# Patient Record
Sex: Female | Born: 1955 | Race: White | Hispanic: No | Smoking: Current some day smoker
Health system: Southern US, Community
[De-identification: ages and names within clinical notes are randomized; demographics above are authoritative.]

## PROBLEM LIST (undated history)

## (undated) DIAGNOSIS — M1990 Unspecified osteoarthritis, unspecified site: Secondary | ICD-10-CM

## (undated) DIAGNOSIS — G4730 Sleep apnea, unspecified: Secondary | ICD-10-CM

## (undated) DIAGNOSIS — Z8719 Personal history of other diseases of the digestive system: Secondary | ICD-10-CM

## (undated) DIAGNOSIS — R011 Cardiac murmur, unspecified: Secondary | ICD-10-CM

## (undated) DIAGNOSIS — R51 Headache: Secondary | ICD-10-CM

## (undated) DIAGNOSIS — R32 Unspecified urinary incontinence: Secondary | ICD-10-CM

## (undated) DIAGNOSIS — Z8489 Family history of other specified conditions: Secondary | ICD-10-CM

## (undated) DIAGNOSIS — Z806 Family history of leukemia: Secondary | ICD-10-CM

## (undated) DIAGNOSIS — Z9109 Other allergy status, other than to drugs and biological substances: Secondary | ICD-10-CM

## (undated) DIAGNOSIS — I00 Rheumatic fever without heart involvement: Secondary | ICD-10-CM

## (undated) DIAGNOSIS — F329 Major depressive disorder, single episode, unspecified: Secondary | ICD-10-CM

## (undated) DIAGNOSIS — D3500 Benign neoplasm of unspecified adrenal gland: Secondary | ICD-10-CM

## (undated) DIAGNOSIS — M797 Fibromyalgia: Secondary | ICD-10-CM

## (undated) DIAGNOSIS — Z800 Family history of malignant neoplasm of digestive organs: Secondary | ICD-10-CM

## (undated) DIAGNOSIS — E785 Hyperlipidemia, unspecified: Secondary | ICD-10-CM

## (undated) DIAGNOSIS — M8580 Other specified disorders of bone density and structure, unspecified site: Secondary | ICD-10-CM

## (undated) DIAGNOSIS — M75100 Unspecified rotator cuff tear or rupture of unspecified shoulder, not specified as traumatic: Secondary | ICD-10-CM

## (undated) DIAGNOSIS — G8929 Other chronic pain: Secondary | ICD-10-CM

## (undated) DIAGNOSIS — K219 Gastro-esophageal reflux disease without esophagitis: Secondary | ICD-10-CM

## (undated) DIAGNOSIS — I10 Essential (primary) hypertension: Secondary | ICD-10-CM

## (undated) DIAGNOSIS — Z8711 Personal history of peptic ulcer disease: Secondary | ICD-10-CM

## (undated) DIAGNOSIS — M810 Age-related osteoporosis without current pathological fracture: Secondary | ICD-10-CM

## (undated) DIAGNOSIS — I219 Acute myocardial infarction, unspecified: Secondary | ICD-10-CM

## (undated) DIAGNOSIS — Z87442 Personal history of urinary calculi: Secondary | ICD-10-CM

## (undated) DIAGNOSIS — Z803 Family history of malignant neoplasm of breast: Secondary | ICD-10-CM

## (undated) DIAGNOSIS — I2510 Atherosclerotic heart disease of native coronary artery without angina pectoris: Secondary | ICD-10-CM

## (undated) DIAGNOSIS — F32A Depression, unspecified: Secondary | ICD-10-CM

## (undated) DIAGNOSIS — H3530 Unspecified macular degeneration: Secondary | ICD-10-CM

## (undated) HISTORY — DX: Unspecified macular degeneration: H35.30

## (undated) HISTORY — PX: BACK SURGERY: SHX140

## (undated) HISTORY — DX: Essential (primary) hypertension: I10

## (undated) HISTORY — PX: ABDOMINAL HYSTERECTOMY: SHX81

## (undated) HISTORY — DX: Family history of leukemia: Z80.6

## (undated) HISTORY — DX: Benign neoplasm of unspecified adrenal gland: D35.00

## (undated) HISTORY — DX: Family history of malignant neoplasm of breast: Z80.3

## (undated) HISTORY — DX: Age-related osteoporosis without current pathological fracture: M81.0

## (undated) HISTORY — DX: Hyperlipidemia, unspecified: E78.5

## (undated) HISTORY — PX: LAPAROSCOPIC LYSIS OF ADHESIONS: SHX5905

## (undated) HISTORY — PX: PELVIC ABCESS DRAINAGE: SHX2189

## (undated) HISTORY — DX: Family history of malignant neoplasm of digestive organs: Z80.0

## (undated) HISTORY — DX: Rheumatic fever without heart involvement: I00

## (undated) HISTORY — DX: Cardiac murmur, unspecified: R01.1

## (undated) HISTORY — DX: Atherosclerotic heart disease of native coronary artery without angina pectoris: I25.10

## (undated) HISTORY — DX: Acute myocardial infarction, unspecified: I21.9

## (undated) HISTORY — PX: MYOMECTOMY: SHX85

## (undated) HISTORY — DX: Unspecified urinary incontinence: R32

## (undated) HISTORY — DX: Other chronic pain: G89.29

## (undated) HISTORY — DX: Other specified disorders of bone density and structure, unspecified site: M85.80

---

## 1997-07-07 ENCOUNTER — Other Ambulatory Visit: Admission: RE | Admit: 1997-07-07 | Discharge: 1997-07-07 | Payer: Self-pay | Admitting: Cardiology

## 1997-07-21 ENCOUNTER — Other Ambulatory Visit: Admission: RE | Admit: 1997-07-21 | Discharge: 1997-07-21 | Payer: Self-pay | Admitting: Cardiology

## 1998-02-27 ENCOUNTER — Other Ambulatory Visit: Admission: RE | Admit: 1998-02-27 | Discharge: 1998-02-27 | Payer: Self-pay | Admitting: Obstetrics and Gynecology

## 1999-10-09 ENCOUNTER — Encounter: Payer: Self-pay | Admitting: Obstetrics and Gynecology

## 1999-10-09 ENCOUNTER — Encounter: Admission: RE | Admit: 1999-10-09 | Discharge: 1999-10-09 | Payer: Self-pay | Admitting: Obstetrics and Gynecology

## 2000-10-14 ENCOUNTER — Encounter: Payer: Self-pay | Admitting: Obstetrics and Gynecology

## 2000-10-14 ENCOUNTER — Encounter: Admission: RE | Admit: 2000-10-14 | Discharge: 2000-10-14 | Payer: Self-pay | Admitting: Obstetrics and Gynecology

## 2001-01-25 ENCOUNTER — Inpatient Hospital Stay (HOSPITAL_COMMUNITY): Admission: EM | Admit: 2001-01-25 | Discharge: 2001-01-29 | Payer: Self-pay | Admitting: *Deleted

## 2001-02-19 ENCOUNTER — Encounter (HOSPITAL_COMMUNITY): Admission: RE | Admit: 2001-02-19 | Discharge: 2001-03-29 | Payer: Self-pay | Admitting: Cardiology

## 2001-11-19 ENCOUNTER — Inpatient Hospital Stay (HOSPITAL_COMMUNITY): Admission: EM | Admit: 2001-11-19 | Discharge: 2001-11-30 | Payer: Self-pay | Admitting: Emergency Medicine

## 2001-12-20 ENCOUNTER — Encounter (HOSPITAL_COMMUNITY): Admission: RE | Admit: 2001-12-20 | Discharge: 2002-03-20 | Payer: Self-pay | Admitting: Cardiology

## 2001-12-23 ENCOUNTER — Encounter: Admission: RE | Admit: 2001-12-23 | Discharge: 2001-12-23 | Payer: Self-pay | Admitting: Obstetrics and Gynecology

## 2001-12-23 ENCOUNTER — Encounter: Payer: Self-pay | Admitting: Obstetrics and Gynecology

## 2003-02-24 ENCOUNTER — Encounter: Admission: RE | Admit: 2003-02-24 | Discharge: 2003-02-24 | Payer: Self-pay | Admitting: Emergency Medicine

## 2003-12-20 ENCOUNTER — Encounter (INDEPENDENT_AMBULATORY_CARE_PROVIDER_SITE_OTHER): Payer: Self-pay | Admitting: Specialist

## 2003-12-20 ENCOUNTER — Ambulatory Visit (HOSPITAL_COMMUNITY): Admission: RE | Admit: 2003-12-20 | Discharge: 2003-12-20 | Payer: Self-pay | Admitting: *Deleted

## 2004-07-26 ENCOUNTER — Encounter: Admission: RE | Admit: 2004-07-26 | Discharge: 2004-07-26 | Payer: Self-pay | Admitting: Obstetrics and Gynecology

## 2005-02-05 ENCOUNTER — Emergency Department (HOSPITAL_COMMUNITY): Admission: EM | Admit: 2005-02-05 | Discharge: 2005-02-05 | Payer: Self-pay | Admitting: Emergency Medicine

## 2005-05-12 ENCOUNTER — Encounter: Admission: RE | Admit: 2005-05-12 | Discharge: 2005-05-12 | Payer: Self-pay | Admitting: Emergency Medicine

## 2005-09-11 ENCOUNTER — Encounter: Admission: RE | Admit: 2005-09-11 | Discharge: 2005-09-11 | Payer: Self-pay | Admitting: Emergency Medicine

## 2005-12-26 ENCOUNTER — Encounter: Admission: RE | Admit: 2005-12-26 | Discharge: 2005-12-26 | Payer: Self-pay | Admitting: Emergency Medicine

## 2006-01-16 ENCOUNTER — Inpatient Hospital Stay (HOSPITAL_BASED_OUTPATIENT_CLINIC_OR_DEPARTMENT_OTHER): Admission: RE | Admit: 2006-01-16 | Discharge: 2006-01-16 | Payer: Self-pay | Admitting: Cardiology

## 2006-09-22 ENCOUNTER — Encounter: Admission: RE | Admit: 2006-09-22 | Discharge: 2006-09-22 | Payer: Self-pay | Admitting: Emergency Medicine

## 2007-05-18 ENCOUNTER — Encounter: Admission: RE | Admit: 2007-05-18 | Discharge: 2007-05-18 | Payer: Self-pay | Admitting: Neurosurgery

## 2007-06-11 ENCOUNTER — Encounter: Admission: RE | Admit: 2007-06-11 | Discharge: 2007-06-11 | Payer: Self-pay | Admitting: Cardiology

## 2007-09-23 ENCOUNTER — Other Ambulatory Visit: Admission: RE | Admit: 2007-09-23 | Discharge: 2007-09-23 | Payer: Self-pay | Admitting: Obstetrics and Gynecology

## 2007-11-30 ENCOUNTER — Encounter: Admission: RE | Admit: 2007-11-30 | Discharge: 2007-11-30 | Payer: Self-pay | Admitting: Obstetrics and Gynecology

## 2009-01-29 ENCOUNTER — Encounter: Admission: RE | Admit: 2009-01-29 | Discharge: 2009-01-29 | Payer: Self-pay | Admitting: Gastroenterology

## 2009-04-06 ENCOUNTER — Encounter: Admission: RE | Admit: 2009-04-06 | Discharge: 2009-04-06 | Payer: Self-pay | Admitting: Obstetrics and Gynecology

## 2009-10-15 ENCOUNTER — Ambulatory Visit: Payer: Self-pay | Admitting: Cardiology

## 2010-01-30 ENCOUNTER — Ambulatory Visit: Payer: Self-pay | Admitting: Cardiology

## 2010-04-01 ENCOUNTER — Other Ambulatory Visit: Payer: Self-pay | Admitting: Obstetrics and Gynecology

## 2010-04-01 DIAGNOSIS — Z1231 Encounter for screening mammogram for malignant neoplasm of breast: Secondary | ICD-10-CM

## 2010-04-30 ENCOUNTER — Ambulatory Visit
Admission: RE | Admit: 2010-04-30 | Discharge: 2010-04-30 | Disposition: A | Discharge status: Home / Self Care | Payer: BC Managed Care – PPO | Source: Ambulatory Visit | Attending: Obstetrics and Gynecology | Admitting: Obstetrics and Gynecology

## 2010-04-30 DIAGNOSIS — Z1231 Encounter for screening mammogram for malignant neoplasm of breast: Secondary | ICD-10-CM

## 2010-06-03 ENCOUNTER — Other Ambulatory Visit: Payer: Self-pay | Admitting: Cardiology

## 2010-06-03 DIAGNOSIS — I10 Essential (primary) hypertension: Secondary | ICD-10-CM

## 2010-06-03 MED ORDER — DILTIAZEM HCL ER COATED BEADS 120 MG PO CP24: 120.0000 mg | ORAL_CAPSULE | Freq: Every day | ORAL | Status: DC

## 2010-06-03 NOTE — Telephone Encounter (Signed)
Patient's Diltiazem HCL CD 120 mg is out.  She wants this refilled through [facility]--90 day supply.

## 2010-06-17 ENCOUNTER — Other Ambulatory Visit: Payer: Self-pay | Admitting: *Deleted

## 2010-06-17 DIAGNOSIS — E7800 Pure hypercholesterolemia, unspecified: Secondary | ICD-10-CM

## 2010-06-27 ENCOUNTER — Telehealth: Payer: Self-pay | Admitting: Cardiology

## 2010-06-27 NOTE — Telephone Encounter (Signed)
Fax: [phone] Post Op of her stints in 12/31/94

## 2010-06-28 ENCOUNTER — Encounter: Payer: Self-pay | Admitting: Cardiology

## 2010-07-03 ENCOUNTER — Encounter: Payer: Self-pay | Admitting: Cardiology

## 2010-07-03 DIAGNOSIS — R233 Spontaneous ecchymoses: Secondary | ICD-10-CM | POA: Insufficient documentation

## 2010-07-03 DIAGNOSIS — M2550 Pain in unspecified joint: Secondary | ICD-10-CM | POA: Insufficient documentation

## 2010-07-03 DIAGNOSIS — I259 Chronic ischemic heart disease, unspecified: Secondary | ICD-10-CM | POA: Insufficient documentation

## 2010-07-03 DIAGNOSIS — M25519 Pain in unspecified shoulder: Secondary | ICD-10-CM | POA: Insufficient documentation

## 2010-07-03 DIAGNOSIS — R238 Other skin changes: Secondary | ICD-10-CM | POA: Insufficient documentation

## 2010-07-03 DIAGNOSIS — E785 Hyperlipidemia, unspecified: Secondary | ICD-10-CM | POA: Insufficient documentation

## 2010-07-04 ENCOUNTER — Other Ambulatory Visit (INDEPENDENT_AMBULATORY_CARE_PROVIDER_SITE_OTHER): Payer: BC Managed Care – PPO | Admitting: *Deleted

## 2010-07-04 ENCOUNTER — Encounter: Payer: Self-pay | Admitting: Cardiology

## 2010-07-04 ENCOUNTER — Ambulatory Visit (INDEPENDENT_AMBULATORY_CARE_PROVIDER_SITE_OTHER): Payer: BC Managed Care – PPO | Admitting: Cardiology

## 2010-07-04 DIAGNOSIS — E7800 Pure hypercholesterolemia, unspecified: Secondary | ICD-10-CM

## 2010-07-04 DIAGNOSIS — M25519 Pain in unspecified shoulder: Secondary | ICD-10-CM

## 2010-07-04 DIAGNOSIS — I2510 Atherosclerotic heart disease of native coronary artery without angina pectoris: Secondary | ICD-10-CM | POA: Insufficient documentation

## 2010-07-04 LAB — HEPATIC FUNCTION PANEL
Albumin: 3.9 g/dL (ref 3.5–5.2)
Bilirubin, Direct: 0 mg/dL (ref 0.0–0.3)
Total Protein: 6.4 g/dL (ref 6.0–8.3)

## 2010-07-04 LAB — BASIC METABOLIC PANEL
BUN: 12 mg/dL (ref 6–23)
GFR: 79.78 mL/min (ref >60.00)
Sodium: 141 mEq/L (ref 135–145)

## 2010-07-04 LAB — LIPID PANEL: HDL: 54.6 mg/dL (ref >39.00)

## 2010-07-04 NOTE — Progress Notes (Signed)
Subjective:   Donna Bullock is seen today for followup visit. 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 She has an MRI pending. She is in pain and requires nocturnal narcotics that she doesn't take pain medications during the day so she can continue to work. She has not had any anginal pain. Unfortunately, she continues to smoke a half-pack of cigarettes per day.  She had depression with Chantix. Her first stent in her right coronary artery was in 1996. 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 Her last catheterization was in November of 2007. At that time, she had persistent patency of the stents in the right coronary artery and no other significant obstructive disease. Her last stress was in November of 2009. Her EF at that time was 57% with no evidence of ischemia. She has a history of hyperlipemia that is treated. Unfortunately, she has ongoing tobacco abuse. 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 She has a history of hypertension. 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 This was discovered on CT scan in December of 2010.  Current Outpatient Prescriptions  Medication Sig Dispense Refill  . aspirin 81 MG tablet Take 81 mg by mouth daily.        Marland Kitchen atenolol (TENORMIN) 25 MG tablet Take 25 mg by mouth daily.        . carisoprodol (SOMA) 350 MG tablet Take 1 tablet by mouth Twice daily.      . clopidogrel (PLAVIX) 75 MG tablet Take 75 mg by mouth daily.        Marland Kitchen diltiazem (CARDIZEM CD) 120 MG 24 hr capsule Take 1 capsule (120 mg total) by mouth daily.  90 capsule  3  . ergocalciferol (VITAMIN D2) 50000 UNITS capsule Take 50,000 Units by mouth once a week.        . escitalopram (LEXAPRO) 10 MG tablet Take 10 mg by mouth daily.        . fish oil-omega-3 fatty acids 1000 MG capsule Take by mouth daily.        . fluticasone (FLONASE) 50  MCG/ACT nasal spray 2 sprays by Nasal route daily.        Marland Kitchen HYDROmorphone (DILAUDID) 2 MG tablet Take 2 mg by mouth daily.        . isosorbide mononitrate (IMDUR) 30 MG 24 hr tablet Take 15 mg by mouth daily.        Marland Kitchen lisinopril (PRINIVIL,ZESTRIL) 20 MG tablet Take 20 mg by mouth daily.        . methocarbamol (ROBAXIN) 750 MG tablet Take 750 mg by mouth daily. (UNKNOWN)      . Probiotic Product (PROBIOTIC PO) Take by mouth daily.        . simvastatin (ZOCOR) 40 MG tablet Take 40 mg by mouth at bedtime.        . Multiple Vitamins-Minerals (CENTRUM SILVER) tablet Take 1 tablet by mouth daily.        Marland Kitchen tiZANidine (ZANAFLEX) 4 MG tablet Take 4 mg by mouth every 6 (six) hours as needed. (UNKNOWN)        Allergies  Allergen Reactions  . Valium     Patient Active Problem List  Diagnoses  . Shoulder pain  . Bruises easily  . Joint pain  . MI (myocardial infarction)  . Hyperlipidemia  History  Smoking status  . Current Everyday Smoker -- 0.5 packs/day  . Types: Cigarettes  Smokeless tobacco  . Never Used    History  Alcohol Use No    Family History  Problem Relation Age of Onset  . Alcohol abuse Mother   . Alcohol abuse Father     Review of Systems:   The patient denies any heat or cold intolerance.  No weight gain or weight loss.  The patient denies headaches or blurry vision.  There is no cough or sputum production.  The patient denies dizziness.  There is no hematuria or hematochezia.  The patient denies any muscle aches or arthritis.  The patient denies any rash.  The patient denies frequent falling or instability.  There is no history of depression or anxiety.  All other systems were reviewed and are negative.   Physical Exam:   She has weakness with extension of her right arm. Weight is 154. Blood pressure 144 of 94 sitting, heart rate is 56 and regular.The head is normocephalic and atraumatic.  Pupils are equally round and reactive to light.  Sclerae nonicteric.   Conjunctiva is clear.  Oropharynx is unremarkable.  There's adequate oral airway.  Neck is supple there are no masses.  Thyroid is not enlarged.  There is no lymphadenopathy.  Lungs are clear.  Chest is symmetric.  Heart shows a regular rate and rhythm.  S1 and S2 are normal.  There is no murmur click or gallop.  Abdomen is soft normal bowel sounds.  There is no organomegaly.  Genital and rectal deferred.  Extremities are without edema.  Peripheral pulses are adequate.   The patient is not depressed.  Skin is warm and dry.  Assessment / Plan:

## 2010-07-04 NOTE — Assessment & Plan Note (Signed)
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 MRI is planned for a couple days. She should be a candidate for an operation on her neck if needed that it would be reasonable to do a followup adenosine Cardiolite study for preoperative clearance

## 2010-07-04 NOTE — Assessment & Plan Note (Signed)
She is asymptomatic at this point in time we'll continue current medications. Hopefully she'll be able to have her cervical neck problem addressed with her upcoming MRI

## 2010-07-05 ENCOUNTER — Telehealth: Payer: Self-pay | Admitting: *Deleted

## 2010-07-05 NOTE — Cardiovascular Report (Signed)
[place]  Patient:    Donna Bullock, Donna Bullock Visit Number: [id number] MRN: [id number]          Service Type: MED Location: [facility] [id number] Attending Physician:  Eleanora Neighbor Dictated by:   Colleen Can. Deborah Chalk, M.D. Proc. Date: 01/27/01 Admit Date:  01/25/2001                          Cardiac Catheterization  HISTORY:  Ms. Rabon presented with acute inferior myocardial infarction two days ago and had segmental narrowing from occlusion in a distal left anterior descending that was not felt to be amenable to angioplasty.  She was treated with antiplatelet agents and returned for a reevaluation of the distal left anterior descending, as well as the evaluation of left ventricular function. She had had a previous stent also in the right coronary artery and her EKG changes had been inferoapical (probably from an apical infarction), so pictures of the right coronary artery were performed as well.  PROCEDURES PERFORMED:  Left heart catheterization with selective coronary angiography and left ventricular angiography.  TYPE AND SITE OF ENTRY:  Percutaneous, left femoral artery.  CATHETERS:  Six [place] #4 curved Judkins right and left coronary catheters, 6-French pigtail ventriculography catheter.  CONTRAST MATERIAL:  Omnipaque.  MEDICATIONS GIVEN PRIOR TO PROCEDURE:  Valium 10 mg p.o.  MEDICATION GIVEN DURING PROCEDURE:  Versed 6 mg IV.  COMMENTS:  The patient tolerated the procedure well.  ANGIOGRAPHIC DATA: 1. Left main coronary artery:  Normal. 2. Left circumflex:  The left circumflex continues as a moderately large    marginal vessel.  It is normal. 3. Left anterior descending:  The left anterior descending continues over    two-thirds of its course without significant obstruction, then there is    approximately 40 mm of 50% narrowing in a segmental fashion.  This has    improved from two days earlier.  The left anterior descending itself   wraps around the apex. 4. Right coronary artery:  The right coronary artery has a stented segment    proximally.  There is mild narrowing in this portion.  The distal right    coronary artery is normal.  LEFT VENTRICULAR ANGIOGRAPHY:  Left ventricular angiogram was performed in the RAO position.  Overall cardiac size and silhouette are normal.  There is very mild apical hypokinesis with improvement of wall motion in the inferoapical portion of the heart.  It is still mildly hypokinetic, but it would be hard to notice the abnormality if it were not in comparison with the previous known defect.  Global ejection fraction is 60%.  OVERALL IMPRESSION: 1. Mild inferoapical hypokinesia, but improved from two days earlier. 2. Persistent patency of the left anterior descending with a segmental    50% narrowing. 3. 30% narrowing in the proximal right coronary artery at the level of the    previously stented segment. 4. Essentially normal left circumflex.  DISCUSSION:  We will manage Pat medically at this point in time with lipid lowering antiplatelet agents and nitrates. Dictated by:   Colleen Can Deborah Chalk, M.D. Attending Physician:  Eleanora Neighbor DD:  01/27/01 TD:  01/27/01 Job: [id number]

## 2010-07-05 NOTE — Cardiovascular Report (Signed)
NAME:  Donna Bullock, Donna Bullock              ACCOUNT NO.:  [id number]   MEDICAL RECORD NO.:  [id number]          PATIENT TYPE:  OIB   LOCATION:  1962                         FACILITY:  [facility]   PHYSICIAN:  Colleen Can. Deborah Chalk, M.D.DATE OF BIRTH:  Jun 21, 1955   DATE OF PROCEDURE:  01/16/2006  DATE OF DISCHARGE:  01/16/2006                            CARDIAC CATHETERIZATION   HISTORY:  [name] is a 55 year old female with previous known  cardiovascular disease with previous stents to the right coronary artery  as well as previous anterior infarction.  She is referred for  catheterization because of atypical chest pain.   PROCEDURE:  Left heart catheterization with selective coronary  angiography and left ventricular angiography.   TYPE AND SITE OF ENTRY:  Percutaneous right femoral artery catheters, 4-  French 4 curved Judkins right and left coronary catheters, 4-French  pigtail ventriculographic catheter.   CONTRAST MATERIAL:  Omnipaque.   MEDICATIONS GIVEN DURING THE PROCEDURE:  Fentanyl and Versed 2 mg.   COMMENTS:  The patient tolerated the procedure well.   HEMODYNAMIC DATA:  The aortic pressure 126/78, LV was 125/5-11.   ANGIOGRAPHIC DATA:  1. Left main coronary artery was normal.  2. Left anterior descending was a long vessel that crossed the apex.      It was normal.  3. Left circumflex was a moderate size vessel.  It was normal.  4. Right coronary artery was a dominant vessel.  There was a long area      of stent beyond the acute margin extending beyond the crux.  There      was also a stent to the proximal segment of the right coronary      artery.  There were mild to moderate 20-40% narrowings in the      proximal right coronary artery.  The long stented segment in the      distal right coronary artery was quite satisfactory but just beyond      the posterior descending but still within the body of the stent,      there was a 50% somewhat focal narrowing.  The distal vessel  was      satisfactory and still the same luminal dimensions as the narrowing      within the stent.  The residual narrowing within the stent would      appear to be approximately 2 mm in diameter.  5. Left ventricular angiogram was performed in the RAO position.      Overall cardiac size and silhouette are normal.  There was mild      inferior hypokinesia.  The global ejection fraction was estimated      to be 55%.   OVERALL IMPRESSION:  1. Well preserved global left ventricular function with mild inferior      hypokinesia.  2. Persistent patency of the stents within the right coronary artery      with mild proximal narrowing and mild to      moderate distal narrowing but no significant obstructive disease.  3. Essentially normal left coronary artery.   DISCUSSION:  These findings would suggest that we  could have a good  outcome with risk factor modification.      Colleen Can. Deborah Chalk, M.D.  Electronically Signed     SNT/MEDQ  D:  01/16/2006  T:  01/16/2006  Job:  [id number]   cc:   Colleen Can. Deborah Chalk, M.D.

## 2010-07-05 NOTE — Cardiovascular Report (Signed)
NAME:  Donna Bullock, Donna Bullock NO.:  [id number]   MEDICAL RECORD NO.:  [id number]                   PATIENT TYPE:  INP   LOCATION:  3727                                 FACILITY:  [facility]   PHYSICIAN:  Peter M. Swaziland, M.D.               DATE OF BIRTH:  1955/06/14   DATE OF PROCEDURE:  11/23/2001  DATE OF DISCHARGE:                              CARDIAC CATHETERIZATION   INDICATIONS FOR PROCEDURE:  The patient is a 55 year old white female who  has presented with acute inferior myocardial infarction four days prior.  She underwent stenting of the distal right coronary artery.  She has a  history of coronary spasm.  The morning of procedure, the patient developed  acute severe substernal chest pain associated with marked ST elevation  anterolaterally.  She was hypotensive and was brought back to the cardiac  catheterization lab.   ACCESS:  Via the left femoral artery using standard Seldinger technique.   EQUIPMENT USED:  A #7 French arterial sheath, #6 French 4-cm right and left  Judkins catheters, #6 French pigtail catheter.   CONTRAST:  Omnipaque, 280 cc.   HEMODYNAMIC DATA:  1. Aortic pressure was initially 102/62.  It improved later in the procedure     to 131/74.  2. Left ventricular pressure was 132 with an EDP of 21 mmHg.   MEDICATIONS:  The patient received a total of 1000 mg of intracoronary  Verapamil given in 200 mg boluses.  She was given a total of 1400 mcg of  intracoronary nitroglycerin given in 200 mcg boluses.  She was also given  150 mcg of intracoronary adenosine given in 30-60 mcg boluses.  In addition,  she received IV heparin, 5000 units.  She was given IV Integrilin.  She was  maintained on IV nitroglycerin titrated up to 20 drops/hour.  She was also  given two 10-mg boluses of IV Cardizem followed by Cardizem drip at 10  mg/hour.  In addition, she was given 2 mg of IV Versed, 2 mg of IV morphine.   ANGIOGRAPHIC DATA:  We initially  performed angiogram of the right coronary  artery since this had recently been stented.  The main body of the right  coronary artery was still widely patent including the stented area with no  obstruction or thrombus noted.  There was pruning of the distal right  coronary branches including the posterolateral, posterior descending  branches.  We then performed left coronary angiography.  The left main  coronary artery was without significant disease.  The left anterior  descending artery had diffuse narrowing beginning in the proximal vessel up  to 30%.  Following the first septal perforator, there was a very long  segment of diffuse narrowing of the vessel up to 70-80% followed by subtotal  occlusion of the mid LAD.  There was TIMI grade 0 flow to the distal LAD.  The left circumflex coronary  artery, likewise, demonstrated severe diffuse  narrowing in the vessel up to 90%.  A first marginal branch was occluded.   Left ventricular angiography demonstrated moderate hypokinesia of the distal  anterolateral wall and apex as well as the mid inferior wall.  Left  ventricular systolic function was mildly reduced with ejection fraction  estimated at 45%.   In reviewing her study from four days prior, it was clear that the first  marginal branch was a very large vessel with baseline angiogram  demonstrating that the left circumflex coronary artery was at least a 3-mm  vessel.  The left anterior descending artery appeared to be at least a 3.5-  mm vessel.  Based on these findings, it was felt that her acute coronary  occlusion involving the first marginal branch and the mid LAD were secondary  to severe diffuse coronary spasm.  At this point, we anticoagulated the  patient.  We began giving intracoronary injections of vasodilator through  the left coronary catheter.  Over the following hour-and-a-half, the patient  received a total of vasodilators as listed above.  At this point, her LAD  did  reperfuse.  While she continued to have somewhat diffuse spasm in the  mid vessel, the subtotal stenosis was reduced down to 50-60% and she had  restoration of TIMI-3 flow.  Unfortunately, the left circumflex artery did  not respond as well.  It continued to have diffuse severe spasm of the  entire proximal circumflex with persistent subtotal occlusion of the first  marginal branch.  We did achieve TIMI grade 1 flow down the marginal branch  but there was still severe persistent spasm.  The patient remained  hemodynamically stable throughout the procedure without significant  bradycardia or hypotension.  Her chest pain had improved significantly over  baseline and at the end of the procedure was having minimal chest  discomfort.  Her repeat ECG at this point showed complete resolution of ST  elevation.  She did have persistent ST segment depression anterolaterally.  There did not appear to be any focal coronary lesion that could be  approached by catheter intervention with angioplasty.  The patient was kept  on IV heparin, IV Integrilin, and high-dose IV vasodilators and transferred  to the critical care unit at this point.   FINAL INTERPRETATION:  1. Acute severe three-vessel coronary vasospasm resulting in occlusion in     the mid left anterior descending artery and the first marginal branch of     the left circumflex coronary artery.  Reperfusion obtained with high-dose     intracoronary vasodilator therapy.  2. Mild left ventricular dysfunction.  3. Continued patency of the right coronary artery stent.                                               Peter M. Swaziland, M.D.    PMJ/MEDQ  D:  11/25/2001  T:  11/28/2001  Job:  [id number]   cc:   Colleen Can. Deborah Chalk, M.D.  [address], [address]  [place]  [place] [address]  Fax: [phone]

## 2010-07-05 NOTE — H&P (Signed)
Donna Bullock. Donna Bullock  Patient:    Donna Bullock, Donna Bullock Visit Number: [id number] MRN: [id number]          Service Type: MED Location: [id number] [phone] Attending Physician:  Donna Bullock Dictated by:   Donna Bullock. Donna Bullock, M.D. Admit Date:  01/25/2001                           History and Physical  CHIEF COMPLAINT: Acute inferolateral myocardial infarction.  HISTORY OF PRESENT ILLNESS: [name] was in her usual state of health when she had onset of substernal chest pain for about an hour at work.  She called and was referred to the emergency room, and had ST segment elevation in inferolateral leads.  She was given heparin and IV nitroglycerin and taken immediately to the catheterization laboratory.  In the catheterization laboratory she had sudden onset ventricular fibrillation and had defibrillation externally x 3, and became stable and proceeded on with urgent catheterization.  Her examination while in the emergency room showed an anxious 55 year old female.  HEENT negative.  Heart showed regular rate and rhythm.  Lungs were clear.  Peripheral pulses were satisfactory.  No lower extremity edema.  PAST MEDICAL HISTORY:  1. 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  2. Intermittent anxiety and depression.  3. History of cigarette abuse.  4. History of gastric ulcers in the past.  FAMILY HISTORY: Unremarkable.  Parents were alcoholic.  She is close to a sister, who lives in [place].  SOCIAL HISTORY: She is single.  She is a Production designer, theatre/television/film at [employer].  She has ongoing cigarette abuse.  Has some chronic anxiety but is quite functional. She is quite pleasant and delightful to deal with on a regular basis.  She is under lots of stress and is a perfectionist in her work.  REVIEW OF SYSTEMS: Basically negative.  There have been no real warning symptoms.  She was shoveling snow  yesterday.  PHYSICAL EXAMINATION:  GENERAL: She is a pleasant 55 year old female, appears stated age. HEENT: Negative.  NECK: Supple without bruits.  LUNGS: Clear.  HEART: Regular rate and rhythm.  ABDOMEN: Soft, nontender.  EXTREMITIES: Without edema.  OVERALL ASSESSMENT: Acute inferolateral myocardial infarction.  PLAN: Proceed on directly with emergency catheterization and angioplasty. Dictated by:   Donna Bullock Donna Bullock, M.D. Attending Physician:  Donna Bullock DD:  01/25/01 TD:  01/26/01 Job: [id number]

## 2010-07-05 NOTE — Telephone Encounter (Signed)
Message copied by Barnetta Hammersmith on Fri Jul 05, 2010 10:05 AM ------      Message from: Roger Shelter      Created: Fri Jul 05, 2010  8:57 AM       Ok, recheck in six months.

## 2010-07-05 NOTE — Telephone Encounter (Signed)
Left message on cell with lab results and to have labs in six months.

## 2010-07-05 NOTE — H&P (Signed)
   Donna Bullock, Donna Bullock                          ACCOUNT NO.:  [id number]   MEDICAL RECORD NO.:  [id number]                   PATIENT TYPE:  INP   LOCATION:  4702                                 FACILITY:  [facility]   PHYSICIAN:  Colleen Can. Deborah Chalk, M.D.            DATE OF BIRTH:  02-23-1955   DATE OF ADMISSION:  11/19/2001  DATE OF DISCHARGE:                                HISTORY & PHYSICAL   REASON FOR ADMISSION:  Acute inferior myocardial infarction.   HISTORY:  The patient had an onset of approximately 2 hours of substernal  chest pain.  Initial EKG was unremarkable as she presented to the emergency  room.  However, approximately 9:30 in the morning she developed an acute ST  segment elevation and is admitted directly to the catheterization lab.   She has a history of an acute inferior myocardial infarction in 1996 and an  acute anterior apical and inferolateral myocardial infarction in December of  2002.  She had diffuse spasm appearance in the distal left anterior  descending in December of 2002.  She did have ventricular fibrillation  during that hospital admission.   PAST MEDICAL HISTORY:  Otherwise is remarkable for the dissection of the  right coronary artery on December 31, 1994 treated with a J&J stent.  She  has a history of anxiety, depression.  History of peptic ulcer disease.   FAMILY HISTORY:  Basically unremarkable.  Parents were alcoholics.  She is  close to a sister that lives in [place].   SOCIAL HISTORY:  She is single.  She is Production designer, theatre/television/film at [employer].  She is  under a good bit of stress.  It is a chronic problem.   REVIEW OF SYSTEMS:  Otherwise unremarkable.   PHYSICAL EXAMINATION:  GENERAL:  She is a pleasant 55 year old female,  appears appropriate age.  She is diaphoretic.  VITAL SIGNS:  Her blood pressure was 105/70, heart rate 50, respiratory rate  20 and unlabored.  HEENT:  Negative.  NECK:  Supple without bruits.  LUNGS:  Clear.  HEART:   Regular rate and rhythm.  ABDOMEN:  Soft, nontender.  EXTREMITIES:  Without edema.  NEUROLOGICAL:  Neurologically she is intact.   IMAGING STUDIES:  Compatible with an acute inferior myocardial infarction.    OVERALL IMPRESSION:  Acute inferior myocardial infarction.   PLAN:  Acute catheterization.                                               Colleen Can. Deborah Chalk, M.D.    SNT/MEDQ  D:  11/19/2001  T:  11/22/2001  Job:  [id number]

## 2010-07-05 NOTE — Cardiovascular Report (Signed)
NAMESHILEE, BIGGS NO.:  [id number]   MEDICAL RECORD NO.:  [id number]                   PATIENT TYPE:  INP   LOCATION:  2930                                 FACILITY:  [facility]   PHYSICIAN:  Colleen Can. Deborah Chalk, M.D.            DATE OF BIRTH:  [birth year]   DATE OF PROCEDURE:  11/19/2001  DATE OF DISCHARGE:                              CARDIAC CATHETERIZATION   PROCEDURE:  Acute cardiac catheterization with angioplasty and stents placed  in the right coronary artery in the setting of an acute inferior myocardial  infarction.   INDICATIONS FOR PROCEDURE:  Acute inferior myocardial infarction.   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  She had an acute  myocardial infarction with ventricular fibrillation and a segmental  narrowing in the left anterior descending in 12/02.  She presents with two  hours of chest pain, and then sudden elevation of ST segments while in the  emergency room.   PROCEDURE:  Left heart catheterization with selective coronary angiography,  left ventricular angiography, angioplasty of the posterior descending  coronary, and then subsequently returned with stent placement in the long  segment of the right coronary artery.   TYPE AND SITE OF ENTRY:  Percutaneous right femoral artery.   CATHETERS:  A 6 French 4 curved Judkins right and left coronary catheters, a  6 French pigtail ventricular catheter, a 7 [place] guide catheter, high  torqued floppy guide wire, a 2.5 x 20 Quantum Maverick, subsequently.  A  return after stent placement with a 3.5 x 20 Maverick for post-dilatation  into the junction of the two stents, the proximal stent was a J&J Cypher  stent 3.5 mm in diameter x 33 mm length, with the distal stent being a 3.0 x  28 mm Cypher stent with approximately 5 mm of overlap.   MEDICATIONS GIVEN DURING PROCEDURE:  1.  Heparin.  2. Integrilin.  3. Versed.  4. Zofran.  5. IV nitroglycerin.  6. Intracoronary nitroglycerin.  7. Intracoronary verapamil.  8. At the end of the procedure, Plavix.   COMMENTS:  The patient tolerated the procedure reasonably well, but had a  considerable amount of chest pain through the procedure.   HEMODYNAMIC DATA:  The aortic pressure was 102/80, LV was 135/7-22, there  was no aortic valve gradient noted on pullback.   ANGIOGRAPHIC DATA:  1. The right coronary artery had abrupt narrowing in its mid portion to     approximately 90% narrowing.  Initially, the posterior descending was     occluded.  There was stain of the contrast at the acute margin of the     heart.  There was no flow initially into the posterior descending artery.     Evidence for the Palmaz-Schatz stent could be noted in the proximal right  coronary artery.  2. The left main coronary artery is normal.  3. The left anterior descending is a small vessel that extends around the     apex.  There is no significant obstructive lesions in the right coronary     artery, and this is in star contrast to the residual lesion after her     event of 01/18/01.  The left anterior descending is essentially normal.     There is a small distal diagonal vessel which has some abrupt narrowing     distally.  4. The left circumflex is a reasonably large vessel.  It continues on, and     has somewhat of an abrupt narrowing towards its distal portion, but there     is really no significant focal disease proximally.   LEFT VENTRICULAR ANGIOGRAM:  The left ventricular angiogram is performed in  the RAO position.  Overall cardiac size and silhouette are normal.  There is  mild inferior hypokinesia.  Global ejection fraction of 55%.  There is no  mitral regurgitation noted.   ANGIOPLASTY PROCEDURE:  We used a JR4 guide with side holes and a high  torqued floppy guide wire.  We reviewed the old films, and noted at the  level  there appeared to have staining of the vessel.  There was no branch at  that location, and we were able to surmise that this was a large dissection  in the vessel.  We then returned and were able to pass the guide wire into  what would be the posterior descending vessel.  Using a 2.5 balloon, we were  able to re-establish flow into the posterior descending vessel.  We watched  this for about 15 minutes, and realized that this dissection planed in the  more proximal portion of the right coronary artery would eventually  compromise flow distally.  At that point, we elected to proceed with stent  placement, going back to what would appear to be a more normal segment and  trying to stent the entire length of this dissection.  Initially, we placed  a 3.5 x 32 mm Cypher stent, inflated to 8 atmospheres.  There was  satisfactory flow after the stent placement.  We then returned with a 28 mm  x 3 mm Cypher stent and overlapped the distal end of the larger more  proximal stent.  This was inflated to a maximum of 20 atmospheres.  We then  returned and used a 3.5 x 20 mm balloon to post dilate the junction point of  the stent.  We were able to have satisfactory patency of the posterior  descending coronary, although it remained somewhat small, it did have good  flow.   OVERALL IMPRESSION:  1. An acute dissection and occlusion of branches of the right coronary     artery with an acute inferior myocardial infarction with placement of     approximately 60 mm of Cypher stent in total in the right coronary artery     extending from the acute margin of the heart to beyond the crux with     persistent patency of the posterior descending coronary and distal     branches of the right coronary artery.  2. Well preserved global left ventricular function.  3. Improvement in appearance of the left anterior descending from the acute     event of 12/02.  Colleen Can.  Deborah Chalk, M.D.    SNT/MEDQ  D:  11/19/2001  T:  11/23/2001  Job:  [id number]

## 2010-07-05 NOTE — H&P (Signed)
NAME:  Donna Bullock, Donna Bullock              ACCOUNT NO.:  [id number]   MEDICAL RECORD NO.:  [id number]           PATIENT TYPE:   LOCATION:                                 FACILITY:   PHYSICIAN:  Colleen Can. Deborah Chalk, M.D.    DATE OF BIRTH:   DATE OF ADMISSION:  DATE OF DISCHARGE:                              HISTORY & PHYSICAL   ADMISSION HISTORY AND PHYSICAL   DATE OF ADMISSION:  Planned for Friday, January 16, 2006.   CHIEF COMPLAINT:  Chest pain.   HISTORY OF PRESENT ILLNESS:  Donna Bullock is a 55 year old white female who  has a known history of ischemic heart disease.  She has had previous  inferolateral MI, secondary to dissection of the right coronary  approximately 11 years ago.  She has had recurrent vasospasm with  subsequent inferolateral MI in 2003.  She presented to the office for a  routine evaluation on January 12, 2006 and had complaints of chest  pressure that has been going on with and without exertion.  She feels as  if a hand is pushing down on her.  She has not used nitroglycerin.  The episodes have been occurring more frequently and have been present  probably the last 6-8 months.  She has also had some known cervical disc  disease.  She feels like the symptoms are not related.  She now presents  for elective cardiac catheterization.   PAST MEDICAL HISTORY:  1. 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  She had an acute inferolateral MI      in December of 2002 secondary to vasospasm of the LAD.  At that      time, she had a V-fib arrest and required defibrillation x3.  Her      last catheterization occurred in October of 2003.  She first      underwent cardiac catheterization on October 3 and had stents      placed to the right coronary, again in the setting of acute      inferior myocardial infarction.  She had placement of approximately      60 mm of CYPHER stent at that time.  On November 23, 2001, she was      taken back to the catheterization lab with acute severe 3-vessel      coronary vasospasm that resulted in an occlusion of the mid LAD, as      well as first marginal branch of the left circumflex.  Reperfusion      was obtained with high dose intracoronary vasodilator therapy.  The      stents, at that time, in the right coronary were noted to be      patent.  She did have mild LV dysfunction with an ejection fraction      of 45%.  She has been followed with Cardiolite studies, the last      was in August of 2006, which was felt to show  no evidence of      ischemia and the ejection fraction was 56% at that time.  2. Ongoing tobacco abuse.  3. Postmenopausal.  4. History of gastric ulcers.  5. Anxiety.  6. Hyperlipidemia.  7. Cervical disc disease, currently requiring use of narcotics.   ALLERGIES:  VALIUM.   CURRENT MEDICATIONS:  Include:  1. Isosorbide 60 mg at bedtime.  2. Plavix 75 mg a day.  3. Prinivil 20 mg a day.  4. Lipitor 10 at bedtime.  5. Atenolol 25 mg a day.  6. Aspirin daily.  7. Donna Bullock daily.  8. Cardizem CD 120 mg a day.  9. Flexeril, hydrocodone and Darvocet p.r.n.   FAMILY HISTORY:  Her parents were alcoholics.   SOCIAL HISTORY:  She is single.  She works at [employer].  She has  significant stress in regards to her job in the past, which does not  seem to be an issue at the present time.  She does have ongoing tobacco  abuse of 15 cigarettes to 1 pack a day.   REVIEW OF SYSTEMS:  She has had considerable neck problems.  She is on  narcotics at night.  She has been switched over to simvastatin for her  cholesterol medicine.  She has gained weight.  She has tried to remain  active.  She does wish to try to stop smoking.   PHYSICAL EXAMINATION:  GENERAL:  She is a pleasant, anxious white female  in no acute distress.  Her weight is 171 pounds.  SKIN:  Warm and dry and color is  unremarkable.  VITAL SIGNS:  Blood pressure is 110/70.  Heart rate is 80.  LUNGS:  Somewhat coarse.  CARDIAC EXAM:  Shows a regular rhythm.  ABDOMEN:  Soft.  Positive bowel sounds.  Nontender.  EXTREMITIES:  Without edema.  NEUROLOGIC:  Shows no gross focal deficits.   PERTINENT LABS:  Pending.   OVERALL IMPRESSION:  1. Longstanding history of ischemic heart disease secondary to a      remote history of dissection and subsequent vasospasm.  She has      approximately 60 mm of CYPHER stent in the right coronary.  2. Hyperlipidemia, now on simvastatin.  3. Hypertension.  4. Ongoing tobacco abuse.  5. Cervical disc disease.   PLAN:  We will proceed on with a diagnostic catheterization.  The  procedure has been reviewed in full detail and she is willing to proceed  on Friday, January 16, 2006.      Sharlee Blew, N.P.      Colleen Can. Deborah Chalk, M.D.  Electronically Signed    LC/MEDQ  D:  01/13/2006  T:  01/13/2006  Job:  [id number]   cc:   Oley Balm. Georgina Pillion, M.D.

## 2010-07-05 NOTE — Discharge Summary (Signed)
[name], [name]                          ACCOUNT NO.:  [id number]   MEDICAL RECORD NO.:  [id number]                   PATIENT TYPE:  INP   LOCATION:  3727                                 FACILITY:  [facility]   PHYSICIAN:  Colleen Can. Deborah Chalk, M.D.            DATE OF BIRTH:  Mar 09, 1955   DATE OF ADMISSION:  11/19/2001  DATE OF DISCHARGE:  11/30/2001                                 DISCHARGE SUMMARY   PRIMARY DISCHARGE DIAGNOSES:  1. Acute inferior wall myocardial infarction on November 19, 2001 with     subsequent angioplasty and stenting in the right coronary artery.  2. Recurrent episode of acute severe substernal chest pain with marked ST     elevation anterolaterally with associated hypotension with repeat cardiac     catheterization showing acute severe three-vessel coronary vasospasm     resulting in occlusion in the mid left anterior descending, first     marginal branch of the left circumflex, with reperfusion obtained after     high-dose intercoronary vasodilator therapy; mild ventricular     dysfunction; and continued patency of the right coronary artery.  3. Known atherosclerotic cardiovascular disease with previous history of     acute dissection of the right coronary artery and stent placement in     1996.  She had acute myocardial infarction with ventricular fibrillation     and segmental narrowing in the left anterior descending in December 2002.  4. Tobacco abuse.  5. Hyperlipidemia.   HISTORY OF PRESENT ILLNESS:  The patient is a very pleasant 55 year old  white female who has a known history of atherosclerotic cardiovascular  disease with previous history of dissection of the right coronary dating  back to 55.  That was initially treated with stent placement.  She had  recurrent myocardial infarction that was associated with a ventricular  fibrillation arrest in December 2002.  At that time she had a segmental  narrowing in the left anterior descending and was  treated with  anticoagulation and 2b/3a inhibitors.  She has basically done well since  that time.  She has continued to smoke.   She presents to the hospital on November 19, 2001 with 2 hours of chest pain.  When she was in the emergency room, she had sudden elevation of ST segments  inferiorly.  She was subsequently taken to the cardiac catheterization lab  for further intervention.   Please see the history and physical per Dr. Roger Shelter for further  patient presentation and profile.   LABORATORY DATA ON ADMISSION:  Lipid panel showed triglycerides 137, HDL 47,  LDL 46 with a total cholesterol of 120.   Cardiac enzymes initially peaked at an MB of 185.   Chemistries on admission were normal except for glucose mildly elevated at  111.  CBC was normal.   HOSPITAL COURSE:  The patient was taken emergently to the cardiac  catheterization lab per Dr. Duffy Rhody  Deborah Chalk shortly upon her arrival to the  emergency room.  The procedure was tolerated well.  There was an acute  dissection and occlusion of the branches of the right coronary artery with  an acute inferior myocardial infarction with placement of approximately 60  mm of CYPHER stent in the right coronary extending from the acute margin to  just beyond the crux with persistent patency of the posterior descending and  distal branches of the right coronary.  At that time there was well  preserved global LV function.  It should be noted that the left anterior  descending had an improvement in its appearance from its acute event in  December 2002.  The left circumflex is a reasonably large vessel and had no  significant focal disease proximally.   Postprocedure, the patient was transferred to the coronary care unit.  She  initially did well and was subsequently transferred to telemetry on November 21, 2001.  On the morning of November 22, 2001 she did have some chest pain  that was relieved with morphine and was short lived.  It was  felt that it  was in her best interest to maintain the patient in the hospital to increase  her activity and to assess her overall physical status.  The following  morning however, the patient developed acute severe chest pain that was  unrelieved with morphine and nitroglycerin.  Her blood pressure dropped to  60 systolic.  She had hyperacute ST elevation anterolaterally.  She was  taken back to the cardiac catheterization lab emergently by Dr. Peter  [place].  At that time the patient was noted to have acute severe three-  vessel coronary vasospasm which resulted in occlusion of the mid left  anterior descending and the first marginal branch of the left circumflex,  reperfusion was obtained after high-dose intracoronary vasodilator therapy.  There was mild LV dysfunction.  The stent to the right coronary remained  patent.  She was subsequently transferred back into the coronary care unit.  She was maintained on IV Cardizem, IV nitroglycerin in addition to her other  medicines, her anticoagulants (Plavix and aspirin) were continued as well.  She basically remained in the hospital until November 30, 2001 to assess her  overall response to medical therapy.  She did have repeat cardiac MB's which  showed a peak of 208 after this last episode of chest pain.  Throughout the  remainder of her hospitalization she did very well.  She was somewhat  emotional which was to be expected.  She was started back on low-dose Paxil.  Today on November 30, 2001 she is doing well without complaints, she has been  up and ambulatory without problems, she has had no reoccurrence of chest  pain, blood pressure has been maintained at approximately 90-100 systolic  with nitrates, calcium-channel blockers, beta blocker and ACE inhibitor  therapy.  She is felt to be a stable candidate for discharge today with  further evaluation on an outpatient basis.   DISCHARGE MEDICATIONS:  1. Aspirin daily.  2. Plavix 75 mg  daily. 3. Lipitor 10 mg a day.  4. Foltx tablet daily.  5. Vistaril 10 mg daily.  6. Atenolol 25 mg daily.  7. Imdur 60 mg daily.  8. Cardizem CD 120 mg daily.  9. Paxil CR 25 mg and nitroglycerin p.r.n.   ACTIVITY:  Her activity is to be progressive, she may walk 10-15 minutes  twice a day, outpatient cardiac rehab consult is obtained as  well.   FOLLOW UP:  We will see her back in the office in approximately 1 week.  She  is to call if problems arise sooner.     Juanell Fairly C. Earl Gala, N.P.                 Colleen Can. Deborah Chalk, M.D.    LCO/MEDQ  D:  11/30/2001  T:  12/01/2001  Job:  [id number]

## 2010-07-05 NOTE — Discharge Summary (Signed)
. [facility]  Patient:    Donna Bullock, Donna Bullock Visit Number: [id number] MRN: [id number]          Service Type: MED Location: 2000 2013 01 Attending Physician:  Eleanora Neighbor Dictated by:   Jennet Maduro Earl Gala, R.N., A.N.P. Admit Date:  01/25/2001 Discharge Date: 01/29/2001   CC:         Colleen Can. Deborah Chalk, M.D.   Discharge Summary  PRIMARY DISCHARGE DIAGNOSIS:  Acute inferior lateral wall myocardial infarction.  SECONDARY DISCHARGE DIAGNOSES: 1.  Arterial sclerotic cardiovascular disease with previous history of stent     placement to the right coronary artery secondary to dissection in November     1996. 2.  Intermittent anxiety and depression. 3.  Prior tobacco abuse. 4.  Hyperlipidemia. 5.  Previous history of gastric ulcer.  HISTORY OF PRESENT ILLNESS:  [name] is a 55 year old white female who has known coronary disease.  She had been doing well and was in her usual state of health until she had the onset of substernal chest pain while at work on January 25, 2001.  She called the office and was referred to the Emergency Room.  Upon arrival, she was noted to have ST segment elevation in the inferolateral lead.  She was treated with IV Heparin and nitroglycerin and was taken immediately to the Cardiac Catheterization Laboratory.  There she had sudden onset of ventricular fibrillation and required defibrillation externally x three.  She was subsequently admitted for further evaluation.  For further patient presentation and further information please see dictated History and Physical.  LABORATORY AND ACCESSORY DATA:  Hematocrit 32, WBC 9, chemistries normal except for potassium of 2.7 which was replaced accordingly.  Total electrocardiogram as described above.  Chest x-ray showed no active disease.  HOSPITAL COURSE:  Patient was taken emergently to the Cardiac Catheterization Laboratory and was defibrillated x three externally and  then became stable. Cardiac catheterization was then able to commence.  The right coronary artery had 20% proximal narrowing in the stent.  There was satisfactory flow.  The left anterior descending, 2/3 of the way down, the vessel of the artery abruptly narrowed to about 75% for at least 60 mm in length as it crossed the apex and then returned to normal size.  The left circumflex and left main were normal.  It was felt the patient did suffer from component of vasospasm and she was treated with IV Integrilin, nitroglycerin and antiplatelet therapy. Post-procedure she was transferred to coronary care.  Her potassium was replaced accordingly.  It was felt she would need to proceed with repeat cardiac catheterization the following day to evaluate the left anterior descending.  On January 27, 2001, repeat cardiac catheterization occurred.  The left main was normal.  The left circumflex was normal.  The left anterior descending had a segmental 50% stenosis distally.  The right coronary artery was unchanged. There was mild apical akinesis of the left ventricle.  It was felt there was partial resolution of the distal segmental stenosis.  It was felt she would need to continue with antiplatelet agents, nitrate therapy and lipid lowering agents as well.  Post-procedure she was transferred to Transitional Care Unit.  Her activity has been slowly advanced.  She did have some intermittent dizziness with ambulation.  She also suffered from two bouts of nausea and vomiting felt to be due to pain medicine for headaches.  Today on January 29, 2001, she is basically doing well.  She has had no  recurrence of nausea or vomiting.  Her dizziness has resolved.  She currently feels well and has had no recurrence of chest pain.  Both groin sites are unremarkable.  DISCHARGE LABORATORIES:  Her chemistries at discharge show sodium 139, potassium 3.7, chloride 105, CO2 28, BUN 8, creatinine 0.7 and glucose of  77. Overall she was felt to be a stable candidate for discharge today.  DISCHARGE CONDITION:  Stable.  DISCHARGE MEDICATIONS: 1.  Aspirin daily. 2.  Prinivil 10 mg daily. 3.  Atenolol 25 mg daily. 4.  Lipitor 10 mg daily. 5.  Plavix 75 mg daily. 6.  Imdur 60 mg daily. 7.  Potassium 20 mEq daily. 8.  Nitroglycerin p.r.n. ****We will be stopping hydrochlorothiazide.  DISCHARGE INSTRUCTIONS:  Activity light. No driving, no sex.  She may walk five to ten minutes one to two times a day.  Low-fat diet.  DISCHARGE FOLLOW UP:  She will be seen in the office next Friday, February 05, 2001 at 10:00 a.m. or sooner if any problems arise. Dictated by:   Jennet Maduro Earl Gala, R.N., A.N.P. Attending Physician:  Eleanora Neighbor DD:  01/29/01 TD:  01/29/01 Job: [id number]

## 2010-07-05 NOTE — Cardiovascular Report (Signed)
. [facility]  Patient:    Donna Bullock, BRONER Visit Number: [id number] MRN: [id number]          Service Type: MED Location: [id number] Attending Physician:  Corlis Leak. Dictated by:   Colleen Can Deborah Chalk, M.D. Proc. Date: 01/25/01 Admit Date:  01/25/2001                          Cardiac Catheterization  HISTORY: The patient presents with a one-hour history of anterior chest pain. She had previous catheterization at a time of acute onset of chest pain in 1996 when she had an acute dissection of the proximal right coronary artery. She had a stent placed at that time and has done well. Her ECG in the emergency room showed acute inferior lateral changes and she was brought to the catheterization lab emergently.  When she arrived in the catheterization lab from the emergency room, she was talking, became pain-free and then had ventricular fibrillation required defibrillation on three occasions.  PROCEDURE: Left heart catheterization with selective coronary angiography, left ventricular angiography.  TYPE AND SITE OF ENTRY: Percutaneous right femoral artery.  CATHETERS: The 6 French 4 curved Judkins right and left coronary catheters, 6 French pigtail ventriculographic catheter.  CONTRAST MATERIAL: Omnipaque.  MEDICATIONS GIVEN PRIOR TO THE PROCEDURE: Intracoronary nitroglycerin, Versed 1 mg IV, lidocaine 75 mg IV.  COMMENTS: In general, the patient tolerated the procedure well.  ANGIOGRAPHIC DATA: 1. Right coronary artery: The right coronary artery is a large dominant    vessel. The stent is seen in the proximal right coronary artery and there    may be 20-30% narrowing at this level. Flow is excellent. There is good    distal runoff into two distal branches without significant atherosclerosis. 2. Left main coronary artery: Normal. 3. Left circumflex: The left circumflex continues as a large obtuse marginal    vessel. It bifurcates  distally. It is essentially normal. 4. Left anterior descending: The left anterior descending is a very large    vessel that wraps around the apex to the distal inferior wall. There is    approximately a 60 mm segment of somewhat diffuse narrowing two thirds of    the way down the left anterior descending and returns to normal diameter    just prior to crossing the apex. No thrombus or staining is noted. It is a    diffuse narrowing that is approximately 1 to 1.5 mm in diameter and it is    approximately 75% narrow from the previous luminal diameter of the more    proximal portions of the LAD.  LEFT VENTRICULAR ANGIOGRAM: Left ventricular angiogram was performed in the RAO position. Overall cardiac size and silhouette are normal. The global ejection fraction is approximately 50-55%. However, there is regional wall motion with inferior apical and apical hypokinesia. There is no mitral regurgitation, intracardiac calcification or intracavitary filling defect.  OVERALL IMPRESSION: 1. Acute inferior lateral myocardial infarction with resolution of pain with    reperfusion, ventricular fibrillation, and now a diffuse 60 mm segment    of distal left anterior descending narrowing. 2. Persistent patency of the stent in the right coronary artery. 3. Minimal coronary atherosclerosis otherwise.  DISPOSITION: The patient will be managed on anticoagulants and nitrates. Initially we will need to do a repeat catheterization to assess healing of this particular area, especially since it is dangerous enough to cause severe arrhythmia with reperfusion. Dictated by:  Colleen Can. Deborah Chalk, M.D. Attending Physician:  Corlis Leak DD:  01/25/01 TD:  01/25/01 Job: [id number]

## 2010-09-03 ENCOUNTER — Other Ambulatory Visit: Payer: Self-pay | Admitting: Nurse Practitioner

## 2010-09-03 DIAGNOSIS — I10 Essential (primary) hypertension: Secondary | ICD-10-CM

## 2010-09-03 MED ORDER — ATENOLOL 25 MG PO TABS: 25.0000 mg | ORAL_TABLET | Freq: Every day | ORAL | Status: DC

## 2010-09-03 MED ORDER — CLOPIDOGREL BISULFATE 75 MG PO TABS: 75.0000 mg | ORAL_TABLET | Freq: Every day | ORAL | Status: DC

## 2010-09-03 MED ORDER — ESCITALOPRAM OXALATE 10 MG PO TABS: 10.0000 mg | ORAL_TABLET | Freq: Every day | ORAL | Status: DC

## 2010-09-03 MED ORDER — LISINOPRIL 20 MG PO TABS: 20.0000 mg | ORAL_TABLET | Freq: Every day | ORAL | Status: DC

## 2010-09-03 MED ORDER — DILTIAZEM HCL ER COATED BEADS 120 MG PO CP24: 120.0000 mg | ORAL_CAPSULE | Freq: Every day | ORAL | Status: DC

## 2010-09-03 MED ORDER — ISOSORBIDE MONONITRATE ER 30 MG PO TB24: ORAL_TABLET | ORAL | Status: DC

## 2010-09-03 MED ORDER — SIMVASTATIN 40 MG PO TABS: 40.0000 mg | ORAL_TABLET | Freq: Every day | ORAL | Status: DC

## 2010-09-03 NOTE — Telephone Encounter (Signed)
escribe medication per fax request  

## 2010-09-03 NOTE — Telephone Encounter (Signed)
PT REFERRED TO DR [facility] SINCE DR TENNANT RETIRED, PT UNSURE OF HOW TO REFILL HER SCRIPTS. CHART IN BOX.

## 2010-09-30 ENCOUNTER — Telehealth: Payer: Self-pay | Admitting: Cardiology

## 2010-09-30 NOTE — Telephone Encounter (Signed)
Her gastro doctor wants her to go on Omeprazole but her gastro doctor said that it was in her file that she should not take them. She believes that she can but wants to double check. Please call back. I have pulled her chart.

## 2010-09-30 NOTE — Telephone Encounter (Signed)
lm

## 2011-01-06 ENCOUNTER — Ambulatory Visit (INDEPENDENT_AMBULATORY_CARE_PROVIDER_SITE_OTHER): Payer: BC Managed Care – PPO | Admitting: Cardiovascular Disease

## 2011-01-06 ENCOUNTER — Encounter: Payer: Self-pay | Admitting: Cardiovascular Disease

## 2011-01-06 VITALS — BP 140/82 | HR 61 | Resp 12

## 2011-01-06 DIAGNOSIS — E785 Hyperlipidemia, unspecified: Secondary | ICD-10-CM

## 2011-01-06 DIAGNOSIS — I2510 Atherosclerotic heart disease of native coronary artery without angina pectoris: Secondary | ICD-10-CM

## 2011-01-06 NOTE — Assessment & Plan Note (Signed)
Continue statin. Check lipids and LFTs today.

## 2011-01-06 NOTE — Patient Instructions (Signed)
Your physician wants you to follow-up in: 6 months  You will receive a reminder letter in the mail two months in advance. If you don't receive a letter, please call our office to schedule the follow-up appointment.  Your physician recommends that you continue on your current medications as directed. Please refer to the Current Medication list given to you today.  

## 2011-01-06 NOTE — Assessment & Plan Note (Addendum)
Stable at this time. 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 Doing well on long acting nitrates and Calcium channel blockers for her spasm. She is on good medical therapy for her CAD including an ASA, Plavix, beta blocker and statin. Will check BMET, lipids and LFTs today.

## 2011-01-06 NOTE — Progress Notes (Signed)
History of Present Illness: 55 yo female with history of CAD, HLD, HTN,  tobacco abuse here today for cardiac follow up. She has been followed in the past by Dr. Deborah Chalk and was last seen in May 2012.  Her CAD dates back to 1996 when she had a stent placed in her RCA. She had an anterior MI with cardiac arrest (v. Fib) in 2002. Cath showed a 75% distal LAD narrowing and this was managed conservatively  with relook cath two days later showing 50% distal stenosis. She then had an inferior MI in 2003 with placement of overlapping Cypher drug eluting stents in the RCA. The LAD and Circumflex had mild disease per report. Four days later, she had severe chest pain, cath showed severe spasm of the LAD and Circumflex which resolved with IC vasodilators. Her last cath was in 2007 and showed patency of stents in the RCA and no other obstructive disease in the LAD and Circumflex. Her last stress test was in November 2009 and showed normal LVEF with no evidence of ischemia. She continues to smoke 1/2 ppd. Records indicate depression in past on Chantix.  She has had an adrenal tumor c/w an adenoma discovered on CT scan in December 2010 and a bleeding lesion in her colon. At her last visit with Dr. 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   She tells me today that she has been doing well. No chest pain or SOB. She does continue to smoke 1 ppd. No dizziness, near syncope or syncope. She has no complaints today. She wishes to stop smoking and will try Chantix which she has already.    Past Medical History  Diagnosis Date  . Hyperlipidemia   . HTN (hypertension)   . CAD (coronary artery disease)     Inferior MI 1996 tx with bare metal stent RCA. Repeat  anterior MI 2002 with LAD vasospasm. Repeat inferior MI 2003 with occlusion RCA with overlapping Cypher DES in RCA. Repeat cath 2003 with profound vasospasm LAd and Circumflex. Repeat cath 2007  with patent RCA and no  disease in LAD or Circumflex.  . Cervical spine disease   . Adrenal tumor     Past Surgical History  Procedure Date  . Abdominal hysterectomy   . Back surgery     Current Outpatient Prescriptions  Medication Sig Dispense Refill  . aspirin 81 MG tablet Take 81 mg by mouth daily.        Marland Kitchen atenolol (TENORMIN) 25 MG tablet Take 1 tablet (25 mg total) by mouth daily.  90 tablet  3  . carisoprodol (SOMA) 350 MG tablet Take 350 mg by mouth 4 (four) times daily as needed.        . clopidogrel (PLAVIX) 75 MG tablet Take 1 tablet (75 mg total) by mouth daily.  90 tablet  3  . diltiazem (CARDIZEM CD) 120 MG 24 hr capsule Take 1 capsule (120 mg total) by mouth daily.  90 capsule  3  . ergocalciferol (VITAMIN D2) 50000 UNITS capsule Take 50,000 Units by mouth once a week.        . escitalopram (LEXAPRO) 10 MG tablet Take 1 tablet (10 mg total) by mouth daily.  90 tablet  3  . HYDROmorphone (DILAUDID) 2 MG tablet Take 2 mg by mouth daily. As needed      . isosorbide mononitrate (IMDUR) 30 MG 24 hr tablet Take 1/2 tablet daily  45  tablet  3  . lisinopril (PRINIVIL,ZESTRIL) 20 MG tablet Take 1 tablet (20 mg total) by mouth daily.  90 tablet  3  . methocarbamol (ROBAXIN) 750 MG tablet Take 750 mg by mouth daily. (UNKNOWN)  Pt takes if needed      . simvastatin (ZOCOR) 40 MG tablet Take 1 tablet (40 mg total) by mouth at bedtime.  90 tablet  3    Allergies  Allergen Reactions  . Valium     History   Social History  . Marital Status: Single    Spouse Name: N/A    Number of Children: 0  . Years of Education: N/A   Occupational History  .     Social History Main Topics  . Smoking status: Current Everyday Smoker -- 1.0 packs/day    Types: Cigarettes  . Smokeless tobacco: Never Used  . Alcohol Use: No  . Drug Use: No  . Sexually Active: Not on file   Other Topics Concern  . Not on file   Social History Narrative  . No narrative on file    Family History  Problem Relation Age of  Onset  . Alcohol abuse Mother   . Alcohol abuse Father   . Cancer Mother   . Cancer Father     Review of Systems:  As stated in the HPI and otherwise negative.   BP 140/82  Pulse 61  Resp 12  Physical Examination: General: Well developed, well nourished, NAD HEENT: OP clear, mucus membranes moist SKIN: warm, dry. No rashes. Neuro: No focal deficits Musculoskeletal: Muscle strength 5/5 all ext Psychiatric: Mood and affect normal Neck: No JVD, no carotid bruits, no thyromegaly, no lymphadenopathy. Lungs:Clear bilaterally, no wheezes, rhonci, crackles Cardiovascular: Regular rate and rhythm. No murmurs, gallops or rubs. Abdomen:Soft. Bowel sounds present. Non-tender.  Extremities: No lower extremity edema. Pulses are 2 + in the bilateral DP/PT.  EKG: Sinus bradycardia, rate 59 bpm.

## 2011-01-07 LAB — LIPID PANEL
Cholesterol: 140 mg/dL (ref 0–200)
HDL: 62.7 mg/dL (ref >39.00)
LDL Cholesterol: 55 mg/dL (ref 0–99)
Total CHOL/HDL Ratio: 2
Triglycerides: 113 mg/dL (ref 0.0–149.0)
VLDL: 22.6 mg/dL (ref 0.0–40.0)

## 2011-01-07 LAB — BASIC METABOLIC PANEL
Calcium: 9.4 mg/dL (ref 8.4–10.5)
Creatinine, Ser: 0.8 mg/dL (ref 0.4–1.2)
GFR: 78.48 mL/min (ref >60.00)
Sodium: 143 mEq/L (ref 135–145)

## 2011-01-07 LAB — HEPATIC FUNCTION PANEL
Alkaline Phosphatase: 72 U/L (ref 39–117)
Bilirubin, Direct: 0.1 mg/dL (ref 0.0–0.3)

## 2011-05-06 ENCOUNTER — Other Ambulatory Visit: Payer: Self-pay | Admitting: Obstetrics and Gynecology

## 2011-05-06 ENCOUNTER — Other Ambulatory Visit: Payer: Self-pay | Admitting: Family Medicine

## 2011-05-06 DIAGNOSIS — Z1231 Encounter for screening mammogram for malignant neoplasm of breast: Secondary | ICD-10-CM

## 2011-05-07 ENCOUNTER — Telehealth: Payer: Self-pay | Admitting: Cardiovascular Disease

## 2011-05-07 NOTE — Telephone Encounter (Signed)
Pt calling re heart problem and type of med she might need

## 2011-05-07 NOTE — Telephone Encounter (Signed)
It should be safe for her to use low doses of ibuprofen or tylenol. cdm

## 2011-05-07 NOTE — Telephone Encounter (Signed)
Spoke with pt and gave her information from Dr. Clifton James. I instructed her to take as directed on bottle.  She is aware to make sure other medications she may be taking do not contain ibuprofen or acetaminophen.

## 2011-05-07 NOTE — Telephone Encounter (Signed)
Spoke with pt. She has pinched nerve in neck which is causing pain to shoulder, arm and hand. Occurs "off and on."  She has seen orthopedic doctor (Dr. Oleta Mouse) who has prescribed pain killer (dilaudid) and muscle relaxer.  Pt states he will not prescribe anti inflammatory medications due to her heart history.  Pt is concerned about taking pain killers and is asking if Dr. Clifton James will allow her to take an anti inflammatory medication and if so what can she take.  I told pt I would send to Dr. Clifton James for his recommendations.

## 2011-05-19 ENCOUNTER — Ambulatory Visit
Admission: RE | Admit: 2011-05-19 | Discharge: 2011-05-19 | Disposition: A | Discharge status: Home / Self Care | Payer: BC Managed Care – PPO | Source: Ambulatory Visit | Attending: Obstetrics and Gynecology | Admitting: Obstetrics and Gynecology

## 2011-05-19 DIAGNOSIS — Z1231 Encounter for screening mammogram for malignant neoplasm of breast: Secondary | ICD-10-CM

## 2011-05-22 ENCOUNTER — Telehealth: Payer: Self-pay | Admitting: Cardiovascular Disease

## 2011-05-22 NOTE — Telephone Encounter (Signed)
Fax was received in office today and Dr. Clifton James not here this afternoon.  I called and spoke with Megan at Dr. [place] Blas office and pt is to be scheduled for epidural steroid injection. Pt will need to be off Plavix 7 days prior to procedure.  Aundra Millet states pt was seen in office yesterday and procedure has not been scheduled yet. Will be scheduled after Dr. Clifton James determines if Plavix may be held. I called and spoke with pt and told her I would send message to Dr. Clifton James regarding Plavix and let her know his response.

## 2011-05-22 NOTE — Telephone Encounter (Signed)
PT CALLING RE DR NEWTON'S OFFICE STATING THEY FAXED REQUEST YESTERDAY AND TODAY FOR PT TO GO OFF PLAVIX FOR 5-7 DAYS PRIOR TO SURGERY, PLS FAX DR NEWTON @ [phone] OFFICE [phone] AND PT @ [phone] WILL NEED TO GO OFF TOMORROW IF 7 DAYS, PLS CALL ASAP

## 2011-05-23 NOTE — Telephone Encounter (Signed)
OK for patient to hold Plavix for her epidural injection. cdm

## 2011-05-23 NOTE — Telephone Encounter (Signed)
Pt was notified.  Note was faxed.

## 2011-05-23 NOTE — Telephone Encounter (Signed)
Note faxed to [facility]

## 2011-06-17 ENCOUNTER — Telehealth: Payer: Self-pay | Admitting: Cardiovascular Disease

## 2011-06-17 NOTE — Telephone Encounter (Signed)
Pt Dropped off [employer] papers to be completed, these never came to me Dr.McLean completed and I have Mailed to address on paper [place] [employer] [phone] [facility] [place] [address] 06/17/11/KM

## 2011-07-15 ENCOUNTER — Ambulatory Visit (INDEPENDENT_AMBULATORY_CARE_PROVIDER_SITE_OTHER): Payer: BC Managed Care – PPO | Admitting: Cardiovascular Disease

## 2011-07-15 ENCOUNTER — Encounter: Payer: Self-pay | Admitting: Cardiovascular Disease

## 2011-07-15 VITALS — BP 140/84 | HR 50 | Ht 67.0 in | Wt 169.0 lb

## 2011-07-15 DIAGNOSIS — I2510 Atherosclerotic heart disease of native coronary artery without angina pectoris: Secondary | ICD-10-CM

## 2011-07-15 NOTE — Assessment & Plan Note (Signed)
Stable. No chest pains. Doing well on medical therapy. She is known to have coronary vasospasm. She is trying to stop using e-cigarettes. BP is mildly elevated. She will check at home. Lipids are well controlled.

## 2011-07-15 NOTE — Patient Instructions (Signed)
Your physician wants you to follow-up in:  6 months. You will receive a reminder letter in the mail two months in advance. If you don't receive a letter, please call our office to schedule the follow-up appointment.   

## 2011-07-15 NOTE — Progress Notes (Signed)
History of Present Illness: 56 yo female with history of CAD, HLD, HTN, tobacco abuse here today for cardiac follow up. She has been followed in the past by Dr. Deborah Chalk. I met her for the first time November 2012.  Her CAD dates back to 1996 when she had a stent placed in her RCA. She had an anterior MI with cardiac arrest (v. Fib) in 2002. Cath showed a 75% distal LAD narrowing and this was managed conservatively with relook cath two days later showing 50% distal stenosis. She then had an inferior MI in 2003 with placement of overlapping Cypher drug eluting stents in the RCA. The LAD and Circumflex had mild disease per report. Four days later, she had severe chest pain, cath showed severe spasm of the LAD and Circumflex which resolved with IC vasodilators. Her last cath was in 2007 and showed patency of stents in the RCA and no other obstructive disease in the LAD and Circumflex. Her last stress test was in November 2009 and showed normal LVEF with no evidence of ischemia. She continues to smoke 1/2 ppd. Records indicate depression in past on Chantix. She has had an adrenal tumor c/w an adenoma discovered on CT scan in December 2010 and a bleeding lesion in her colon. At her last visit with Dr. 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   She tells me today that she has been doing well. No chest pain or SOB. She has not smoked cigarettes since March 2013. She has been using an electronic cigarette. No dizziness, near syncope or syncope. She has no complaints today.   Primary Care Physician: Dr. Prince Rome  Last Lipid Profile:  Lipid Panel     Component Value Date/Time   CHOL 140 01/06/2011 1635   TRIG 113.0 01/06/2011 1635   HDL 62.70 01/06/2011 1635   CHOLHDL 2 01/06/2011 1635   VLDL 22.6 01/06/2011 1635   LDLCALC 55 01/06/2011 1635     Past Medical History  Diagnosis Date  . Hyperlipidemia   . HTN (hypertension)   . CAD (coronary artery  disease)     Inferior MI 1996 tx with bare metal stent RCA. Repeat  anterior MI 2002 with LAD vasospasm. Repeat inferior MI 2003 with occlusion RCA with overlapping Cypher DES in RCA. Repeat cath 2003 with profound vasospasm LAd and Circumflex. Repeat cath 2007  with patent RCA and no disease in LAD or Circumflex.  . Cervical spine disease   . Adrenal tumor     Past Surgical History  Procedure Date  . Abdominal hysterectomy   . Back surgery     Current Outpatient Prescriptions  Medication Sig Dispense Refill  . aspirin 81 MG tablet Take 81 mg by mouth daily.        Marland Kitchen atenolol (TENORMIN) 25 MG tablet Take 1 tablet (25 mg total) by mouth daily.  90 tablet  3  . baclofen (LIORESAL) 10 MG tablet Take 10 mg by mouth 3 (three) times daily.      . clopidogrel (PLAVIX) 75 MG tablet Take 1 tablet (75 mg total) by mouth daily.  90 tablet  3  . diltiazem (CARDIZEM CD) 120 MG 24 hr capsule Take 1 capsule (120 mg total) by mouth daily.  90 capsule  3  . ergocalciferol (VITAMIN D2) 50000 UNITS capsule Take 50,000 Units by mouth once a week.        . escitalopram (LEXAPRO) 10 MG tablet  Take 1 tablet (10 mg total) by mouth daily.  90 tablet  3  . HYDROmorphone (DILAUDID) 2 MG tablet Take 2 mg by mouth daily. As needed      . isosorbide mononitrate (IMDUR) 30 MG 24 hr tablet Take 1/2 tablet daily  45 tablet  3  . lisinopril (PRINIVIL,ZESTRIL) 20 MG tablet Take 1 tablet (20 mg total) by mouth daily.  90 tablet  3  . simvastatin (ZOCOR) 40 MG tablet Take 1 tablet (40 mg total) by mouth at bedtime.  90 tablet  3    Allergies  Allergen Reactions  . Valium     History   Social History  . Marital Status: Single    Spouse Name: N/A    Number of Children: 0  . Years of Education: N/A   Occupational History  .     Social History Main Topics  . Smoking status: Current Everyday Smoker -- 1.0 packs/day    Types: Cigarettes  . Smokeless tobacco: Never Used  . Alcohol Use: No  . Drug Use: No  .  Sexually Active: Not on file   Other Topics Concern  . Not on file   Social History Narrative  . No narrative on file    Family History  Problem Relation Age of Onset  . Alcohol abuse Mother   . Alcohol abuse Father   . Cancer Mother   . Cancer Father     Review of Systems:  As stated in the HPI and otherwise negative.   BP 140/84  Pulse 50  Ht 5\' 7"  (1.702 m)  Wt 169 lb (76.658 kg)  BMI 26.47 kg/m2  Physical Examination: General: Well developed, well nourished, NAD HEENT: OP clear, mucus membranes moist SKIN: warm, dry. No rashes. Neuro: No focal deficits Musculoskeletal: Muscle strength 5/5 all ext Psychiatric: Mood and affect normal Neck: No JVD, no carotid bruits, no thyromegaly, no lymphadenopathy. Lungs:Clear bilaterally, no wheezes, rhonci, crackles Cardiovascular: Regular rate and rhythm. No murmurs, gallops or rubs. Abdomen:Soft. Bowel sounds present. Non-tender.  Extremities: No lower extremity edema. Pulses are 2 + in the bilateral DP/PT.  EKG: Sinus brady, rate 50 bpm. Non-specific T wave abnormalities.

## 2011-07-19 ENCOUNTER — Other Ambulatory Visit: Payer: Self-pay | Admitting: *Deleted

## 2011-07-19 DIAGNOSIS — I10 Essential (primary) hypertension: Secondary | ICD-10-CM

## 2011-07-19 MED ORDER — CLOPIDOGREL BISULFATE 75 MG PO TABS: 75.0000 mg | ORAL_TABLET | Freq: Every day | ORAL | Status: DC

## 2011-07-19 MED ORDER — DILTIAZEM HCL ER COATED BEADS 120 MG PO CP24: 120.0000 mg | ORAL_CAPSULE | Freq: Every day | ORAL | Status: DC

## 2011-07-19 MED ORDER — ISOSORBIDE MONONITRATE ER 30 MG PO TB24: ORAL_TABLET | ORAL | Status: DC

## 2011-07-19 MED ORDER — LISINOPRIL 20 MG PO TABS: 20.0000 mg | ORAL_TABLET | Freq: Every day | ORAL | Status: DC

## 2011-07-22 NOTE — Progress Notes (Signed)
Addended by: Reine Just on: 07/22/2011 07:52 PM   Modules accepted: Orders

## 2011-07-23 ENCOUNTER — Other Ambulatory Visit: Payer: Self-pay | Admitting: Cardiovascular Disease

## 2011-07-23 DIAGNOSIS — I10 Essential (primary) hypertension: Secondary | ICD-10-CM

## 2011-07-23 MED ORDER — ISOSORBIDE MONONITRATE ER 30 MG PO TB24: ORAL_TABLET | ORAL | Status: DC

## 2011-07-23 MED ORDER — LISINOPRIL 20 MG PO TABS: 20.0000 mg | ORAL_TABLET | Freq: Every day | ORAL | Status: DC

## 2011-07-23 MED ORDER — CLOPIDOGREL BISULFATE 75 MG PO TABS: 75.0000 mg | ORAL_TABLET | Freq: Every day | ORAL | Status: DC

## 2011-07-23 MED ORDER — SIMVASTATIN 40 MG PO TABS: 40.0000 mg | ORAL_TABLET | Freq: Every day | ORAL | Status: DC

## 2011-07-23 MED ORDER — ATENOLOL 25 MG PO TABS: 25.0000 mg | ORAL_TABLET | Freq: Every day | ORAL | Status: DC

## 2011-07-23 MED ORDER — DILTIAZEM HCL ER COATED BEADS 120 MG PO CP24: 120.0000 mg | ORAL_CAPSULE | Freq: Every day | ORAL | Status: DC

## 2011-07-25 ENCOUNTER — Other Ambulatory Visit: Payer: Self-pay | Admitting: Cardiovascular Disease

## 2011-07-29 ENCOUNTER — Other Ambulatory Visit: Payer: Self-pay | Admitting: *Deleted

## 2011-07-29 MED ORDER — ESCITALOPRAM OXALATE 10 MG PO TABS: 10.0000 mg | ORAL_TABLET | Freq: Every day | ORAL | Status: DC

## 2011-09-01 ENCOUNTER — Telehealth: Payer: Self-pay | Admitting: Cardiovascular Disease

## 2011-09-01 NOTE — Telephone Encounter (Signed)
Left message to call back  

## 2011-09-01 NOTE — Telephone Encounter (Signed)
Message left on pt's identified voice mail that paperwork had been completed by Dr. Graciela Husbands stating she could hold Plavix for 7 days prior to injection and that I would fax form to Dr. [place] Blas office tonight.  Instructed to call back if any questions.

## 2011-09-01 NOTE — Telephone Encounter (Signed)
Spoke with pt who is asking if we received fax in office from Dr. [place] Blas office regarding need stop Plavix prior to planned epidural injection. I told pt it was received today. She states she is feeling well from a cardiac standpoint. Biggest problem is back pain. She would like to have injection this week.  She states she stopped Plavix and ASA on her own on August 28, 2011.  I told her request from Dr. [place] Blas office was to hold Plavix only.  She will resume ASA today.  I told her I would check with Dr. Graciela Husbands (office DOD) about holding Plavix.

## 2011-09-01 NOTE — Telephone Encounter (Signed)
F/u   Patient calling check on fax from Dr. Alvester Morin office

## 2011-09-01 NOTE — Telephone Encounter (Signed)
Please return call to patient at [phone] regarding documentation and plavix hold.

## 2011-09-19 ENCOUNTER — Other Ambulatory Visit: Payer: Self-pay | Admitting: Cardiovascular Disease

## 2011-09-19 MED ORDER — CLOPIDOGREL BISULFATE 75 MG PO TABS: 75.0000 mg | ORAL_TABLET | Freq: Every day | ORAL | Status: DC

## 2011-10-06 ENCOUNTER — Other Ambulatory Visit: Payer: Self-pay | Admitting: Nurse Practitioner

## 2011-10-06 ENCOUNTER — Other Ambulatory Visit: Payer: Self-pay | Admitting: Cardiovascular Disease

## 2011-10-06 MED ORDER — CLOPIDOGREL BISULFATE 75 MG PO TABS: 75.0000 mg | ORAL_TABLET | Freq: Every day | ORAL | Status: DC

## 2012-01-14 ENCOUNTER — Ambulatory Visit: Payer: BC Managed Care – PPO | Admitting: Cardiovascular Disease

## 2012-02-19 ENCOUNTER — Ambulatory Visit (INDEPENDENT_AMBULATORY_CARE_PROVIDER_SITE_OTHER): Payer: BC Managed Care – PPO | Admitting: Cardiovascular Disease

## 2012-02-19 ENCOUNTER — Encounter: Payer: Self-pay | Admitting: Cardiovascular Disease

## 2012-02-19 VITALS — BP 116/78 | HR 57 | Ht 67.0 in | Wt 176.0 lb

## 2012-02-19 DIAGNOSIS — I2510 Atherosclerotic heart disease of native coronary artery without angina pectoris: Secondary | ICD-10-CM

## 2012-02-19 LAB — HEPATIC FUNCTION PANEL
ALT: 17 U/L (ref 0–35)
AST: 23 U/L (ref 0–37)
Alkaline Phosphatase: 65 U/L (ref 39–117)
Total Bilirubin: 0.6 mg/dL (ref 0.3–1.2)

## 2012-02-19 LAB — LIPID PANEL: Total CHOL/HDL Ratio: 3

## 2012-02-19 MED ORDER — NITROGLYCERIN 0.4 MG SL SUBL: 0.4000 mg | SUBLINGUAL_TABLET | SUBLINGUAL | Status: DC | PRN

## 2012-02-19 NOTE — Progress Notes (Signed)
History of Present Illness: 57 yo female with history of CAD, HLD, HTN, tobacco abuse here today for cardiac follow up. She has been followed in the past by Dr. Deborah Chalk. I met her for the first time November 2012. Her CAD dates back to 1996 when she had a stent placed in her RCA. She had an anterior MI with cardiac arrest (v. Fib) in 2002. Cath showed a 75% distal LAD narrowing and this was managed conservatively with relook cath two days later showing 50% distal stenosis. She then had an inferior MI in 2003 with placement of overlapping Cypher drug eluting stents in the RCA. The LAD and Circumflex had mild disease per report. Four days later, she had severe chest pain, cath showed severe spasm of the LAD and Circumflex which resolved with IC vasodilators. Her last cath was in 2007 and showed patency of stents in the RCA and no other obstructive disease in the LAD and Circumflex. Her last stress test was in November 2009 and showed normal LVEF with no evidence of ischemia. She continues to smoke 1/2 ppd. Records indicate depression in past on Chantix. She has had an adrenal tumor c/w an adenoma discovered on CT scan in December 2010 and a bleeding lesion in her colon. At her last visit with Dr. 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 I met her in May 2013.   She tells me today that she has been doing well. No chest pain or SOB. She has not smoked cigarettes since March 2013. She has been using an electronic cigarette. No dizziness, near syncope or syncope. She has no complaints today.   Primary Care Physician: Lavada Mesi  Last Lipid Profile:Lipid Panel     Component Value Date/Time   CHOL 140 01/06/2011 1635   TRIG 113.0 01/06/2011 1635   HDL 62.70 01/06/2011 1635   CHOLHDL 2 01/06/2011 1635   VLDL 22.6 01/06/2011 1635   LDLCALC 55 01/06/2011 1635     Past Medical History  Diagnosis Date  . Hyperlipidemia   . HTN (hypertension)   .  CAD (coronary artery disease)     Inferior MI 1996 tx with bare metal stent RCA. Repeat  anterior MI 2002 with LAD vasospasm. Repeat inferior MI 2003 with occlusion RCA with overlapping Cypher DES in RCA. Repeat cath 2003 with profound vasospasm LAd and Circumflex. Repeat cath 2007  with patent RCA and no disease in LAD or Circumflex.  . Cervical spine disease   . Adrenal tumor     Past Surgical History  Procedure Date  . Abdominal hysterectomy   . Back surgery     Current Outpatient Prescriptions  Medication Sig Dispense Refill  . aspirin 81 MG tablet Take 81 mg by mouth daily.        Marland Kitchen atenolol (TENORMIN) 25 MG tablet Take 1 tablet (25 mg total) by mouth daily.  90 tablet  3  . baclofen (LIORESAL) 10 MG tablet Take 10 mg by mouth 3 (three) times daily.      . clopidogrel (PLAVIX) 75 MG tablet TAKE 1 TABLET BY MOUTH DAILY  90 tablet  2  . diltiazem (CARDIZEM CD) 120 MG 24 hr capsule Take 1 capsule (120 mg total) by mouth daily.  90 capsule  3  . ergocalciferol (VITAMIN D2) 50000 UNITS capsule Take 50,000 Units by mouth once a week.        . escitalopram (LEXAPRO) 10 MG tablet  Take 1 tablet (10 mg total) by mouth daily.  90 tablet  3  . HYDROmorphone (DILAUDID) 2 MG tablet Take 2 mg by mouth daily. As needed      . isosorbide mononitrate (IMDUR) 30 MG 24 hr tablet Take 1/2 tablet daily  90 tablet  1  . lisinopril (PRINIVIL,ZESTRIL) 20 MG tablet Take 1 tablet (20 mg total) by mouth daily.  90 tablet  3  . simvastatin (ZOCOR) 40 MG tablet Take 1 tablet (40 mg total) by mouth at bedtime.  90 tablet  3    Allergies  Allergen Reactions  . Valium     History   Social History  . Marital Status: Single    Spouse Name: N/A    Number of Children: 0  . Years of Education: N/A   Occupational History  .     Social History Main Topics  . Smoking status: Current Every Day Smoker -- 1.0 packs/day    Types: Cigarettes  . Smokeless tobacco: Never Used  . Alcohol Use: No  . Drug Use: No    . Sexually Active: Not on file   Other Topics Concern  . Not on file   Social History Narrative  . No narrative on file    Family History  Problem Relation Age of Onset  . Alcohol abuse Mother   . Alcohol abuse Father   . Cancer Mother   . Cancer Father     Review of Systems:  As stated in the HPI and otherwise negative.   BP 116/78  Pulse 57  Ht 5\' 7"  (1.702 m)  Wt 176 lb (79.833 kg)  BMI 27.57 kg/m2  Physical Examination: General: Well developed, well nourished, NAD HEENT: OP clear, mucus membranes moist SKIN: warm, dry. No rashes. Neuro: No focal deficits Musculoskeletal: Muscle strength 5/5 all ext Psychiatric: Mood and affect normal Neck: No JVD, no carotid bruits, no thyromegaly, no lymphadenopathy. Lungs:Clear bilaterally, no wheezes, rhonci, crackles Cardiovascular: Regular rate and rhythm. No murmurs, gallops or rubs. Abdomen:Soft. Bowel sounds present. Non-tender.  Extremities: No lower extremity edema. Pulses are 2 + in the bilateral DP/PT.  Assessment and Plan:   1. CAD: Stable. No chest pains. Doing well on medical therapy. She is known to have coronary vasospasm. She is trying to stop using e-cigarettes. Lipids are well controlled. BP well controlled. Repeat lipids and LFTs today. She will need SL NTG to use prn.

## 2012-02-19 NOTE — Patient Instructions (Addendum)
Your physician wants you to follow-up in:  12 months.  You will receive a reminder letter in the mail two months in advance. If you don't receive a letter, please call our office to schedule the follow-up appointment.   

## 2012-04-19 ENCOUNTER — Encounter (HOSPITAL_BASED_OUTPATIENT_CLINIC_OR_DEPARTMENT_OTHER): Payer: Self-pay | Admitting: *Deleted

## 2012-04-19 ENCOUNTER — Emergency Department (HOSPITAL_BASED_OUTPATIENT_CLINIC_OR_DEPARTMENT_OTHER): Payer: BC Managed Care – PPO

## 2012-04-19 ENCOUNTER — Emergency Department (HOSPITAL_BASED_OUTPATIENT_CLINIC_OR_DEPARTMENT_OTHER)
Admission: EM | Admit: 2012-04-19 | Discharge: 2012-04-19 | Disposition: A | Discharge status: Home / Self Care | Payer: BC Managed Care – PPO | Attending: Emergency Medicine | Admitting: Emergency Medicine

## 2012-04-19 DIAGNOSIS — I2510 Atherosclerotic heart disease of native coronary artery without angina pectoris: Secondary | ICD-10-CM | POA: Insufficient documentation

## 2012-04-19 DIAGNOSIS — Z79899 Other long term (current) drug therapy: Secondary | ICD-10-CM | POA: Insufficient documentation

## 2012-04-19 DIAGNOSIS — Z87891 Personal history of nicotine dependence: Secondary | ICD-10-CM | POA: Insufficient documentation

## 2012-04-19 DIAGNOSIS — Z7982 Long term (current) use of aspirin: Secondary | ICD-10-CM | POA: Insufficient documentation

## 2012-04-19 DIAGNOSIS — Y929 Unspecified place or not applicable: Secondary | ICD-10-CM | POA: Insufficient documentation

## 2012-04-19 DIAGNOSIS — Z8739 Personal history of other diseases of the musculoskeletal system and connective tissue: Secondary | ICD-10-CM | POA: Insufficient documentation

## 2012-04-19 DIAGNOSIS — Z862 Personal history of diseases of the blood and blood-forming organs and certain disorders involving the immune mechanism: Secondary | ICD-10-CM | POA: Insufficient documentation

## 2012-04-19 DIAGNOSIS — Z8639 Personal history of other endocrine, nutritional and metabolic disease: Secondary | ICD-10-CM | POA: Insufficient documentation

## 2012-04-19 DIAGNOSIS — T148XXA Other injury of unspecified body region, initial encounter: Secondary | ICD-10-CM

## 2012-04-19 DIAGNOSIS — W208XXA Other cause of strike by thrown, projected or falling object, initial encounter: Secondary | ICD-10-CM | POA: Insufficient documentation

## 2012-04-19 DIAGNOSIS — Y939 Activity, unspecified: Secondary | ICD-10-CM | POA: Insufficient documentation

## 2012-04-19 DIAGNOSIS — Z7902 Long term (current) use of antithrombotics/antiplatelets: Secondary | ICD-10-CM | POA: Insufficient documentation

## 2012-04-19 DIAGNOSIS — S9030XA Contusion of unspecified foot, initial encounter: Secondary | ICD-10-CM | POA: Insufficient documentation

## 2012-04-19 DIAGNOSIS — E785 Hyperlipidemia, unspecified: Secondary | ICD-10-CM | POA: Insufficient documentation

## 2012-04-19 DIAGNOSIS — I10 Essential (primary) hypertension: Secondary | ICD-10-CM | POA: Insufficient documentation

## 2012-04-19 NOTE — ED Provider Notes (Signed)
History     CSN: [id number]  Arrival date & time 04/19/12  1240   First MD Initiated Contact with Patient 04/19/12 1247      Chief Complaint  Patient presents with  . Foot Injury    (Consider location/radiation/quality/duration/timing/severity/associated sxs/prior treatment) HPI Patient with left foot injury after body wash fell on foot 3 days ago.  Patient with bruising but ambulatory.  Denies other injury.  States several people at work told her it looked broken and she became worried.  No break in skin, numbness, or tingling.   Past Medical History  Diagnosis Date  . Hyperlipidemia   . HTN (hypertension)   . CAD (coronary artery disease)     Inferior MI 1996 tx with bare metal stent RCA. Repeat  anterior MI 2002 with LAD vasospasm. Repeat inferior MI 2003 with occlusion RCA with overlapping Cypher DES in RCA. Repeat cath 2003 with profound vasospasm LAd and Circumflex. Repeat cath 2007  with patent RCA and no disease in LAD or Circumflex.  . Cervical spine disease   . Adrenal tumor     Past Surgical History  Procedure Laterality Date  . Abdominal hysterectomy    . Back surgery      Family History  Problem Relation Age of Onset  . Alcohol abuse Mother   . Alcohol abuse Father   . Cancer Mother   . Cancer Father     History  Substance Use Topics  . Smoking status: Former Smoker -- 1.00 packs/day  . Smokeless tobacco: Never Used     Comment: currently using e-cigs  . Alcohol Use: No    OB History   Grav Para Term Preterm Abortions TAB SAB Ect Mult Living                  Review of Systems  All other systems reviewed and are negative.    Allergies  Valium  Home Medications   Current Outpatient Rx  Name  Route  Sig  Dispense  Refill  . aspirin 81 MG tablet   Oral   Take 81 mg by mouth daily.           Marland Kitchen atenolol (TENORMIN) 25 MG tablet   Oral   Take 1 tablet (25 mg total) by mouth daily.   90 tablet   3   . clopidogrel (PLAVIX) 75 MG  tablet      TAKE 1 TABLET BY MOUTH DAILY   90 tablet   2   . diltiazem (CARDIZEM CD) 120 MG 24 hr capsule   Oral   Take 1 capsule (120 mg total) by mouth daily.   90 capsule   3   . ergocalciferol (VITAMIN D2) 50000 UNITS capsule   Oral   Take 50,000 Units by mouth once a week.           . escitalopram (LEXAPRO) 10 MG tablet   Oral   Take 1 tablet (10 mg total) by mouth daily.   90 tablet   3   . isosorbide mononitrate (IMDUR) 30 MG 24 hr tablet      Take 1/2 tablet daily   90 tablet   1   . lisinopril (PRINIVIL,ZESTRIL) 20 MG tablet   Oral   Take 1 tablet (20 mg total) by mouth daily.   90 tablet   3   . nitroGLYCERIN (NITROSTAT) 0.4 MG SL tablet   Sublingual   Place 1 tablet (0.4 mg total) under the tongue every 5 (five)  minutes as needed for chest pain.   25 tablet   6   . omeprazole (PRILOSEC) 40 MG capsule   Oral   Take 40 mg by mouth daily.         . simvastatin (ZOCOR) 40 MG tablet   Oral   Take 1 tablet (40 mg total) by mouth at bedtime.   90 tablet   3     BP 138/71  Pulse 57  Temp(Src) 97.7 F (36.5 C) (Oral)  Resp 18  SpO2 97%  Physical Exam  Nursing note and vitals reviewed. Constitutional: She appears well-developed and well-nourished.  HENT:  Head: Normocephalic.  Musculoskeletal:  Right foot with contusion on dorsal aspect    ED Course  Procedures (including critical care time)  Labs Reviewed - No data to display No results found.   No diagnosis found.   Dg Foot Complete Right  04/19/2012  *RADIOLOGY REPORT*  Clinical Data: Trauma, bruising  RIGHT FOOT COMPLETE - 3+ VIEW  Comparison: 05/12/2005  Findings: Normal alignment without acute fracture.  Preserved joint spaces.  No significant arthropathy.  No soft tissue abnormality or radiopaque foreign body.  IMPRESSION: No acute osseous finding.   Original Report Authenticated By: Judie Petit. Miles Costain, M.D.    MDM     Re: contusions symptoms and need for elevation    Hilario Quarry, MD 04/19/12 1358

## 2012-04-19 NOTE — ED Notes (Signed)
She dropped a shampoo bottle on her right foot 3 days ago. Bruising.

## 2012-04-25 ENCOUNTER — Other Ambulatory Visit: Payer: Self-pay | Admitting: Cardiovascular Disease

## 2012-04-26 NOTE — Telephone Encounter (Signed)
Spoke with pt. She does not have primary care MD at this time but is looking for one.  Will send refill for Lexapro for 90 days with one refill until pt can establish with primary care

## 2012-05-10 ENCOUNTER — Other Ambulatory Visit: Payer: Self-pay | Admitting: Cardiovascular Disease

## 2012-05-11 ENCOUNTER — Other Ambulatory Visit: Payer: Self-pay | Admitting: Cardiovascular Disease

## 2012-06-09 ENCOUNTER — Other Ambulatory Visit: Payer: Self-pay | Admitting: Cardiovascular Disease

## 2012-06-16 ENCOUNTER — Other Ambulatory Visit: Payer: Self-pay | Admitting: Cardiovascular Disease

## 2012-06-19 ENCOUNTER — Other Ambulatory Visit: Payer: Self-pay | Admitting: Nurse Practitioner

## 2012-07-13 ENCOUNTER — Other Ambulatory Visit: Payer: Self-pay | Admitting: Cardiovascular Disease

## 2012-07-14 NOTE — Telephone Encounter (Signed)
isosorbide mononitrate (IMDUR) 30 MG 24 hr tablet  Take 1/2 tablet daily   90 tablet   Patient Instructions  Your physician wants you to follow-up in: 12 months.  You will receive a reminder letter in the mail two months in advance. If you don't receive a letter, please call our office to schedule the follow-up appointment. Provider Department Encounter #  01/14/2012  3:30 PM Verne Carrow, MD [facility] [place] [id number]

## 2012-07-29 ENCOUNTER — Encounter: Payer: Self-pay | Admitting: Certified Nurse Midwife

## 2012-07-30 ENCOUNTER — Ambulatory Visit (INDEPENDENT_AMBULATORY_CARE_PROVIDER_SITE_OTHER): Payer: BC Managed Care – PPO | Admitting: Certified Nurse Midwife

## 2012-07-30 ENCOUNTER — Encounter: Payer: Self-pay | Admitting: Certified Nurse Midwife

## 2012-07-30 VITALS — BP 120/72 | HR 68 | Resp 16 | Ht 66.5 in | Wt 174.0 lb

## 2012-07-30 DIAGNOSIS — B373 Candidiasis of vulva and vagina: Secondary | ICD-10-CM

## 2012-07-30 DIAGNOSIS — B3731 Acute candidiasis of vulva and vagina: Secondary | ICD-10-CM

## 2012-07-30 DIAGNOSIS — B372 Candidiasis of skin and nail: Secondary | ICD-10-CM

## 2012-07-30 MED ORDER — NYSTATIN-TRIAMCINOLONE 100000-0.1 UNIT/GM-% EX OINT: TOPICAL_OINTMENT | Freq: Two times a day (BID) | CUTANEOUS | Status: DC

## 2012-07-30 MED ORDER — FLUCONAZOLE 150 MG PO TABS: 150.0000 mg | ORAL_TABLET | Freq: Once | ORAL | Status: DC

## 2012-07-30 NOTE — Progress Notes (Signed)
57 y.o.MarriedCaucasian female [phone] with a 2 day(s) history of the following:burning and vulvar itching Sexually active: yes Last sexual activity:9 days ago. Pt also reports the following associated symptoms: none Patient has nottried over the counter treatment . Patient was treated for UTI and given Cipro 1 1/2 week ago prior to the onset of these symptoms. No new personal products.  O: Healthy female WDWN Affect: normal, orientation x 3     Exam:  WGN:[id number], Urethra, Skene's normal, vulva red, tender, no lesions noted                QIO:[id number]: white and thick, pH 4.0, wet prep done                Cx:  absent                Uterus:absent                Adnexa: no mass, fullness, tenderness and negative for absent  Wet Prep shows:positive yeast, negative clue cell, or trich   A: Yeast vaginitis and yeast dermatitis  P: Reviewed findings.  LK:[id number] local care discussed. Transport planner distributed. Oral antifungal see orders. Topical antifungal Rx Mycolog see order Rx Diflucan RV prn  Reviewed, TL

## 2012-08-02 ENCOUNTER — Other Ambulatory Visit: Payer: Self-pay | Admitting: Obstetrics and Gynecology

## 2012-08-02 DIAGNOSIS — Z1231 Encounter for screening mammogram for malignant neoplasm of breast: Secondary | ICD-10-CM

## 2012-08-18 ENCOUNTER — Other Ambulatory Visit: Payer: Self-pay | Admitting: Cardiovascular Disease

## 2012-08-31 ENCOUNTER — Ambulatory Visit: Payer: BC Managed Care – PPO

## 2012-08-31 ENCOUNTER — Ambulatory Visit
Admission: RE | Admit: 2012-08-31 | Discharge: 2012-08-31 | Disposition: A | Discharge status: Home / Self Care | Payer: BC Managed Care – PPO | Source: Ambulatory Visit | Attending: Obstetrics and Gynecology | Admitting: Obstetrics and Gynecology

## 2012-08-31 DIAGNOSIS — Z1231 Encounter for screening mammogram for malignant neoplasm of breast: Secondary | ICD-10-CM

## 2012-10-11 ENCOUNTER — Ambulatory Visit: Payer: Self-pay | Admitting: Certified Nurse Midwife

## 2012-10-14 ENCOUNTER — Ambulatory Visit (INDEPENDENT_AMBULATORY_CARE_PROVIDER_SITE_OTHER): Payer: BC Managed Care – PPO | Admitting: Certified Nurse Midwife

## 2012-10-14 ENCOUNTER — Encounter: Payer: Self-pay | Admitting: Certified Nurse Midwife

## 2012-10-14 VITALS — BP 116/66 | HR 72 | Resp 18 | Ht 67.5 in | Wt 177.0 lb

## 2012-10-14 DIAGNOSIS — Z0000 Encounter for general adult medical examination without abnormal findings: Secondary | ICD-10-CM

## 2012-10-14 DIAGNOSIS — N952 Postmenopausal atrophic vaginitis: Secondary | ICD-10-CM

## 2012-10-14 DIAGNOSIS — Z01419 Encounter for gynecological examination (general) (routine) without abnormal findings: Secondary | ICD-10-CM

## 2012-10-14 DIAGNOSIS — B373 Candidiasis of vulva and vagina: Secondary | ICD-10-CM

## 2012-10-14 LAB — POCT URINALYSIS DIPSTICK
Urobilinogen, UA: NEGATIVE
pH, UA: 5

## 2012-10-14 MED ORDER — TERCONAZOLE 0.4 % VA CREA: 1.0000 | TOPICAL_CREAM | Freq: Every day | VAGINAL | Status: DC

## 2012-10-14 MED ORDER — NITROFURANTOIN MONOHYD MACRO 100 MG PO CAPS: 100.0000 mg | ORAL_CAPSULE | Freq: Every day | ORAL | Status: DC

## 2012-10-14 NOTE — Progress Notes (Signed)
57 y.o. [id number] Married CaucasianF here for annual exam.  Menopausal, no HRT. Complaining of vaginal itching and discharge for 4 days. Denies burning or odor. Recent treatment with Cipro for UTI. Saw urology for occasional urinary leakage, no issues. Patient states I have the UTI occurrence after sexual activity. Patient empties bladder always, "anything else that I can do" . Using Olive oil for vaginal dryness, but feel not working as well as before. Sees cardiology once yearly now, for labs, and follow up, all stable. No other health issues today.  Patient's last menstrual period was 02/18/1995.          Sexually active: yes  The current method of family planning is status post hysterectomy.    Exercising: no  not recently Smoker:  no  Health Maintenance: Pap:  10/07/11  History of abnormal Pap:  no MMG:  09/01/12 (In Epic) Colonoscopy:  2011 BMD:   11/30/2007 (In Epic) TDaP: unsure will check with PCP  Screening Labs: yes , Hb today: 13.9, Urine today: Leuks 2    reports that she has quit smoking. She has never used smokeless tobacco. She reports that she does not drink alcohol or use illicit drugs.  Past Medical History  Diagnosis Date  . Hyperlipidemia   . HTN (hypertension)   . CAD (coronary artery disease)     Inferior MI 1996 tx with bare metal stent RCA. Repeat  anterior MI 2002 with LAD vasospasm. Repeat inferior MI 2003 with occlusion RCA with overlapping Cypher DES in RCA. Repeat cath 2003 with profound vasospasm LAd and Circumflex. Repeat cath 2007  with patent RCA and no disease in LAD or Circumflex.  . Cervical spine disease   . Adrenal tumor   . Myocardial infarction     times 4  . Benign tumor of adrenal gland   . Fibroid   . Osteopenia     Past Surgical History  Procedure Laterality Date  . Abdominal hysterectomy    . Back surgery    . Myomectomy      age 13    Current Outpatient Prescriptions  Medication Sig Dispense Refill  . aspirin 81 MG tablet Take 81  mg by mouth daily.        Marland Kitchen atenolol (TENORMIN) 25 MG tablet TAKE 1 TABLET DAILY  90 tablet  2  . clopidogrel (PLAVIX) 75 MG tablet TAKE 1 TABLET BY MOUTH DAILY  90 tablet  1  . diltiazem (CARDIZEM CD) 120 MG 24 hr capsule TAKE 1 CAPSULE DAILY  90 capsule  1  . escitalopram (LEXAPRO) 10 MG tablet TAKE 1 TABLET DAILY  90 tablet  1  . fluconazole (DIFLUCAN) 150 MG tablet Take 1 tablet (150 mg total) by mouth once. Take one tablet today and repeat one in 5 days  2 tablet  0  . fluticasone (FLONASE) 50 MCG/ACT nasal spray daily.      . isosorbide mononitrate (IMDUR) 30 MG 24 hr tablet TAKE ONE-HALF (1/2) TABLET DAILY (DR TENNANT HAS RETIRED. FURTHER REFILLS WILL BE BY DR CHRIS MCALHANY)  90 tablet  1  . isosorbide mononitrate (IMDUR) 30 MG 24 hr tablet 30 mg. Take 1/2 tablet daily      . lisinopril (PRINIVIL,ZESTRIL) 20 MG tablet TAKE 1 TABLET DAILY (DR TENNANT HAS RETIRED. FURTHER REFILLS WILL BE FROM DR CHRIS MCALHANY)  90 tablet  2  . nystatin-triamcinolone ointment (MYCOLOG) Apply topically 2 (two) times daily.  30 g  1  . omeprazole (PRILOSEC) 40 MG capsule Take  40 mg by mouth daily.      . simvastatin (ZOCOR) 40 MG tablet TAKE 1 TABLET AT BEDTIME  90 tablet  2   No current facility-administered medications for this visit.    Family History  Problem Relation Age of Onset  . Alcohol abuse Mother   . Breast cancer Mother   . Alcohol abuse Father   . Cancer Father     lung  . Breast cancer Maternal Aunt   . Breast cancer Maternal Grandmother   . Breast cancer Maternal Aunt   . Breast cancer Cousin     ROS:  Pertinent items are noted in HPI.  Otherwise, a comprehensive ROS was negative.  Exam:   BP 116/66  Pulse 72  Resp 18  Ht 5' 7.5" (1.715 m)  Wt 177 lb (80.287 kg)  BMI 27.3 kg/m2  LMP 02/18/1995  Weight change: @WEIGHTCHANGE @ Height:   Height: 5' 7.5" (171.5 cm)  Ht Readings from Last 3 Encounters:  10/14/12 5' 7.5" (1.715 m)  07/30/12 5' 6.5" (1.689 m)  02/19/12 5\' 7"   (1.702 m)    General appearance: alert, cooperative and appears stated age Head: Normocephalic, without obvious abnormality, atraumatic Neck: no adenopathy, supple, symmetrical, trachea midline and thyroid normal to inspection and palpation Lungs: clear to auscultation bilaterally Breasts: normal appearance, no masses or tenderness, No nipple retraction or dimpling, No nipple discharge or bleeding, No axillary or supraclavicular adenopathy Heart: regular rate and rhythm Abdomen: soft, non-tender; bowel sounds normal; no masses,  no organomegaly Extremities: extremities normal, atraumatic, no cyanosis or edema Skin: Skin color, texture, turgor normal. No rashes or lesions Lymph nodes: Cervical, supraclavicular, and axillary nodes normal. No abnormal inguinal nodes palpated Neurologic: Grossly normal   Pelvic: External genitalia:  no lesions              Urethra:  normal appearing urethra with no masses, tenderness or lesions              Bartholins and Skenes: normal                 Vagina: normal appearing vagina with normal color and white thick discharge, no lesions ph 4.5  Wet prep taken              Cervix: absent              Pap taken: no Bimanual Exam:  Uterus:  uterus absent              Adnexa: no mass, fullness, tenderness and right and left absent               Rectovaginal: Confirms               Anus:  normal sphincter tone, no lesions  Wet prep: Positive for yeast, negative for BV, Trich  A:  Well Woman with normal exam  Menopausal no HRT   Yeast vaginitis  Post coital UTI  Atrophic vaginitis using Olive Oil  History of 4 cardiac episodes(heart attacks) first age 39, Coronary artery disease  P:   Reviewed health and wellness pertinent to exam  Discussed findings. Rx Terazol 7 see order  Discussed Macrobid use after sexually activity, patient would like to try to avoid UTI. Advise of status if no change  Encouraged patient to try daily pure coconut oil to see if  works better for dryness and sexual activity.  Will advise if no change.  Rx Macrobid see order  Continue follow  up as indicated  Mammogram yearly pap smear not taken today  counseled on breast self exam, mammography screening, adequate intake of calcium and vitamin D, diet and exercise, Kegel's exercises  return annually or prn  An After Visit Summary was printed and given to the patient.

## 2012-10-15 ENCOUNTER — Telehealth: Payer: Self-pay | Admitting: Certified Nurse Midwife

## 2012-10-15 DIAGNOSIS — B373 Candidiasis of vulva and vagina: Secondary | ICD-10-CM

## 2012-10-15 MED ORDER — TERCONAZOLE 0.4 % VA CREA: 1.0000 | TOPICAL_CREAM | Freq: Every day | VAGINAL | Status: DC

## 2012-10-15 MED ORDER — NITROFURANTOIN MONOHYD MACRO 100 MG PO CAPS: 100.0000 mg | ORAL_CAPSULE | Freq: Every day | ORAL | Status: DC

## 2012-10-15 NOTE — Telephone Encounter (Signed)
[facility] the correct [facility]. Prescriptions were sent to wrong pharmacy at visit yesterday. Patient wants to know if we can stop the RX's to [facility] and get to local pharmacy as soon as possible please?

## 2012-10-15 NOTE — Telephone Encounter (Signed)
[facility] Patient said it was okay to [facility] [address] [facility] [place]

## 2012-10-15 NOTE — Progress Notes (Signed)
Note reviewed, agree with plan.  Liller Yohn, MD  

## 2012-10-16 ENCOUNTER — Other Ambulatory Visit: Payer: Self-pay | Admitting: Cardiovascular Disease

## 2012-10-27 ENCOUNTER — Other Ambulatory Visit: Payer: Self-pay

## 2012-11-05 ENCOUNTER — Ambulatory Visit
Admission: RE | Admit: 2012-11-05 | Discharge: 2012-11-05 | Disposition: A | Discharge status: Home / Self Care | Payer: BC Managed Care – PPO | Source: Ambulatory Visit

## 2012-11-05 DIAGNOSIS — E279 Disorder of adrenal gland, unspecified: Secondary | ICD-10-CM

## 2012-11-05 MED ORDER — GADOBENATE DIMEGLUMINE 529 MG/ML IV SOLN
16.0000 mL | Freq: Once | INTRAVENOUS | Status: AC | PRN
  Administered 2012-11-05: 16 mL via INTRAVENOUS

## 2012-12-01 ENCOUNTER — Other Ambulatory Visit: Payer: Self-pay | Admitting: Nurse Practitioner

## 2013-01-07 ENCOUNTER — Other Ambulatory Visit: Payer: Self-pay | Admitting: Cardiovascular Disease

## 2013-01-13 ENCOUNTER — Other Ambulatory Visit: Payer: Self-pay | Admitting: Cardiovascular Disease

## 2013-01-25 ENCOUNTER — Other Ambulatory Visit: Payer: Self-pay | Admitting: Cardiovascular Disease

## 2013-02-09 ENCOUNTER — Other Ambulatory Visit: Payer: Self-pay | Admitting: Nurse Practitioner

## 2013-02-21 ENCOUNTER — Encounter (INDEPENDENT_AMBULATORY_CARE_PROVIDER_SITE_OTHER): Payer: Self-pay

## 2013-02-21 ENCOUNTER — Encounter: Payer: Self-pay | Admitting: Cardiovascular Disease

## 2013-02-21 ENCOUNTER — Ambulatory Visit (INDEPENDENT_AMBULATORY_CARE_PROVIDER_SITE_OTHER): Payer: BC Managed Care – PPO | Admitting: Cardiovascular Disease

## 2013-02-21 VITALS — BP 124/68 | HR 56 | Ht 67.5 in | Wt 180.0 lb

## 2013-02-21 DIAGNOSIS — Z87891 Personal history of nicotine dependence: Secondary | ICD-10-CM

## 2013-02-21 DIAGNOSIS — I10 Essential (primary) hypertension: Secondary | ICD-10-CM

## 2013-02-21 DIAGNOSIS — I2510 Atherosclerotic heart disease of native coronary artery without angina pectoris: Secondary | ICD-10-CM

## 2013-02-21 DIAGNOSIS — F17201 Nicotine dependence, unspecified, in remission: Secondary | ICD-10-CM

## 2013-02-21 NOTE — Progress Notes (Signed)
History of Present Illness: 58 yo female with history of CAD, HLD, HTN, tobacco abuse here today for cardiac follow up. Donna Bullock has been followed in the past by Dr. Doreatha Lew. I met Donna Bullock for the first time November 2012. Donna Bullock CAD dates back to 1996 when Donna Bullock had a stent placed in Donna Bullock RCA. Donna Bullock had an anterior MI with cardiac arrest (v. Fib) in 2002. Cath showed a 75% distal LAD narrowing and this was managed conservatively with relook cath two days later showing 50% distal stenosis. Donna Bullock then had an inferior MI in 2003 with placement of overlapping Cypher drug eluting stents in the RCA. The LAD and Circumflex had mild disease per report. Four days later, Donna Bullock had severe chest pain, cath showed severe spasm of the LAD and Circumflex which resolved with IC vasodilators. Donna Bullock last cath was in 2007 and showed patency of stents in the RCA and no other obstructive disease in the LAD and Circumflex. Donna Bullock last stress test was in November 2009 and showed normal LVEF with no evidence of ischemia. Donna Bullock has had an adrenal tumor c/w an adenoma discovered on CT scan in December 2010 and a bleeding lesion in Donna Bullock colon.   Donna Bullock tells me today that Donna Bullock has been doing well. No chest pain or SOB. Donna Bullock has not smoked cigarettes since March 2013. Donna Bullock has been using an electronic cigarette. No dizziness, near syncope or syncope. Donna Bullock has gained some weight. Donna Bullock has been inactive. Donna Bullock recently was diagnosed with RA.   Primary Care Physician: [facility]  Last Lipid Profile:Lipid Panel     Component Value Date/Time   CHOL 173 02/19/2012 1012   TRIG 148.0 02/19/2012 1012   HDL 60.60 02/19/2012 1012   CHOLHDL 3 02/19/2012 1012   VLDL 29.6 02/19/2012 1012   LDLCALC 83 02/19/2012 1012     Past Medical History  Diagnosis Date  . Hyperlipidemia   . HTN (hypertension)   . CAD (coronary artery disease)     Inferior MI 1996 tx with bare metal stent RCA. Repeat  anterior MI 2002 with LAD vasospasm. Repeat inferior MI 2003 with occlusion RCA  with overlapping Cypher DES in RCA. Repeat cath 2003 with profound vasospasm LAd and Circumflex. Repeat cath 2007  with patent RCA and no disease in LAD or Circumflex.  . Cervical spine disease   . Adrenal tumor   . Myocardial infarction     times 4  . Benign tumor of adrenal gland   . Fibroid   . Osteopenia     Past Surgical History  Procedure Laterality Date  . Abdominal hysterectomy    . Back surgery    . Myomectomy      age 72    Current Outpatient Prescriptions  Medication Sig Dispense Refill  . aspirin 81 MG tablet Take 81 mg by mouth daily.        Marland Kitchen atenolol (TENORMIN) 25 MG tablet TAKE 1 TABLET DAILY  90 tablet  0  . clopidogrel (PLAVIX) 75 MG tablet TAKE 1 TABLET DAILY  90 tablet  0  . diltiazem (CARDIZEM CD) 120 MG 24 hr capsule TAKE 1 CAPSULE DAILY  90 capsule  0  . escitalopram (LEXAPRO) 10 MG tablet TAKE 1 TABLET DAILY  90 tablet  3  . fluticasone (FLONASE) 50 MCG/ACT nasal spray daily.      . isosorbide mononitrate (IMDUR) 30 MG 24 hr tablet TAKE ONE-HALF (1/2) TABLET DAILY (DR TENNANT HAS RETIRED. FURTHER REFILLS WILL BE BY DR  CHRIS [place])  90 tablet  1  . lisinopril (PRINIVIL,ZESTRIL) 20 MG tablet TAKE 1 TABLET DAILY  90 tablet  0  . omeprazole (PRILOSEC) 40 MG capsule Take 40 mg by mouth daily.      . simvastatin (ZOCOR) 40 MG tablet TAKE 1 TABLET AT BEDTIME  90 tablet  0   No current facility-administered medications for this visit.    Allergies  Allergen Reactions  . Valium     Affects Donna Bullock behavioral    History   Social History  . Marital Status: Married    Spouse Name: N/A    Number of Children: 0  . Years of Education: N/A   Occupational History  .     Social History Main Topics  . Smoking status: Former Smoker -- 1.00 packs/day  . Smokeless tobacco: Never Used     Comment: currently using e-cigs  . Alcohol Use: No  . Drug Use: No  . Sexual Activity: Yes    Partners: Male    Birth Control/ Protection: Surgical     Comment: TAH    Other Topics Concern  . Not on file   Social History Narrative  . No narrative on file    Family History  Problem Relation Age of Onset  . Alcohol abuse Mother   . Breast cancer Mother   . Alcohol abuse Father   . Cancer Father     lung  . Breast cancer Maternal Aunt   . Breast cancer Maternal Grandmother   . Breast cancer Maternal Aunt   . Breast cancer Cousin     Review of Systems:  As stated in the HPI and otherwise negative.   BP 124/68  Pulse 56  Ht 5' 7.5" (1.715 m)  Wt 180 lb (81.647 kg)  BMI 27.76 kg/m2  LMP 02/18/1995  Physical Examination: General: Well developed, well nourished, NAD HEENT: OP clear, mucus membranes moist SKIN: warm, dry. No rashes. Neuro: No focal deficits Musculoskeletal: Muscle strength 5/5 all ext Psychiatric: Mood and affect normal Neck: No JVD, no carotid bruits, no thyromegaly, no lymphadenopathy. Lungs:Clear bilaterally, no wheezes, rhonci, crackles Cardiovascular: Regular rate and rhythm. No murmurs, gallops or rubs. Abdomen:Soft. Bowel sounds present. Non-tender.  Extremities: No lower extremity edema. Pulses are 2 + in the bilateral DP/PT.  EKG: sinus brady, rate 56 bpm. PVC. Non-specific ST and T wave abnormality inferior leads. Unchanged.   Assessment and Plan:   1. CAD: Stable. No chest pains. Doing well on medical therapy. Donna Bullock is known to have coronary vasospasm. Donna Bullock is trying to stop using e-cigarettes. Lipids are well controlled. BP well controlled.   2. HTN: BP well controlled. No changes today.   3. Tobacco abuse, in remission: Donna Bullock is using an electronic cigarette occasionally.

## 2013-02-21 NOTE — Patient Instructions (Addendum)
Your physician wants you to follow-up in:  6 months. You will receive a reminder letter in the mail two months in advance. If you don't receive a letter, please call our office to schedule the follow-up appointment.  Your physician has recommended you make the following change in your medication: Decrease Lexapro to 5 mg daily for 2 weeks and then stop

## 2013-03-19 ENCOUNTER — Other Ambulatory Visit: Payer: Self-pay | Admitting: Cardiovascular Disease

## 2013-03-29 ENCOUNTER — Other Ambulatory Visit: Payer: Self-pay | Admitting: Cardiovascular Disease

## 2013-04-06 ENCOUNTER — Other Ambulatory Visit: Payer: Self-pay | Admitting: Cardiovascular Disease

## 2013-04-21 ENCOUNTER — Other Ambulatory Visit: Payer: Self-pay | Admitting: Cardiovascular Disease

## 2013-05-19 ENCOUNTER — Other Ambulatory Visit: Payer: Self-pay

## 2013-05-19 MED ORDER — CLOPIDOGREL BISULFATE 75 MG PO TABS: ORAL_TABLET | ORAL | Status: DC

## 2013-05-19 MED ORDER — LISINOPRIL 20 MG PO TABS: ORAL_TABLET | ORAL | Status: DC

## 2013-05-19 MED ORDER — DILTIAZEM HCL ER COATED BEADS 120 MG PO CP24: ORAL_CAPSULE | ORAL | Status: DC

## 2013-05-19 MED ORDER — SIMVASTATIN 40 MG PO TABS: ORAL_TABLET | ORAL | Status: DC

## 2013-05-19 MED ORDER — ISOSORBIDE MONONITRATE ER 30 MG PO TB24: ORAL_TABLET | ORAL | Status: DC

## 2013-05-19 MED ORDER — ATENOLOL 25 MG PO TABS: ORAL_TABLET | ORAL | Status: DC

## 2013-07-08 ENCOUNTER — Other Ambulatory Visit: Payer: Self-pay

## 2013-07-08 MED ORDER — LISINOPRIL 20 MG PO TABS: ORAL_TABLET | ORAL | Status: DC

## 2013-09-02 ENCOUNTER — Emergency Department (HOSPITAL_COMMUNITY): Payer: Managed Care, Other (non HMO)

## 2013-09-02 ENCOUNTER — Encounter (HOSPITAL_COMMUNITY): Payer: Self-pay | Admitting: Emergency Medicine

## 2013-09-02 ENCOUNTER — Emergency Department (HOSPITAL_COMMUNITY)
Admission: EM | Admit: 2013-09-02 | Discharge: 2013-09-02 | Disposition: A | Discharge status: Home / Self Care | Payer: Managed Care, Other (non HMO) | Attending: Emergency Medicine | Admitting: Emergency Medicine

## 2013-09-02 DIAGNOSIS — Z7982 Long term (current) use of aspirin: Secondary | ICD-10-CM | POA: Insufficient documentation

## 2013-09-02 DIAGNOSIS — Z8742 Personal history of other diseases of the female genital tract: Secondary | ICD-10-CM | POA: Insufficient documentation

## 2013-09-02 DIAGNOSIS — Z87891 Personal history of nicotine dependence: Secondary | ICD-10-CM | POA: Insufficient documentation

## 2013-09-02 DIAGNOSIS — R110 Nausea: Secondary | ICD-10-CM | POA: Insufficient documentation

## 2013-09-02 DIAGNOSIS — Z79899 Other long term (current) drug therapy: Secondary | ICD-10-CM | POA: Insufficient documentation

## 2013-09-02 DIAGNOSIS — E785 Hyperlipidemia, unspecified: Secondary | ICD-10-CM | POA: Insufficient documentation

## 2013-09-02 DIAGNOSIS — I10 Essential (primary) hypertension: Secondary | ICD-10-CM | POA: Insufficient documentation

## 2013-09-02 DIAGNOSIS — M899 Disorder of bone, unspecified: Secondary | ICD-10-CM | POA: Insufficient documentation

## 2013-09-02 DIAGNOSIS — K625 Hemorrhage of anus and rectum: Secondary | ICD-10-CM | POA: Diagnosis present

## 2013-09-02 DIAGNOSIS — I2510 Atherosclerotic heart disease of native coronary artery without angina pectoris: Secondary | ICD-10-CM | POA: Insufficient documentation

## 2013-09-02 DIAGNOSIS — IMO0002 Reserved for concepts with insufficient information to code with codable children: Secondary | ICD-10-CM | POA: Insufficient documentation

## 2013-09-02 DIAGNOSIS — Z7902 Long term (current) use of antithrombotics/antiplatelets: Secondary | ICD-10-CM | POA: Insufficient documentation

## 2013-09-02 DIAGNOSIS — I252 Old myocardial infarction: Secondary | ICD-10-CM | POA: Insufficient documentation

## 2013-09-02 DIAGNOSIS — K921 Melena: Secondary | ICD-10-CM | POA: Insufficient documentation

## 2013-09-02 DIAGNOSIS — M949 Disorder of cartilage, unspecified: Secondary | ICD-10-CM

## 2013-09-02 LAB — CBC WITH DIFFERENTIAL/PLATELET
BASOS ABS: 0 10*3/uL (ref 0.0–0.1)
Basophils Relative: 0 % (ref 0–1)
Eosinophils Absolute: 0.2 10*3/uL (ref 0.0–0.7)
Eosinophils Relative: 1 % (ref 0–5)
HCT: 45.4 % (ref 36.0–46.0)
Hemoglobin: 15.1 g/dL — ABNORMAL HIGH (ref 12.0–15.0)
LYMPHS PCT: 9 % — AB (ref 12–46)
Lymphs Abs: 1 10*3/uL (ref 0.7–4.0)
MCH: 31 pg (ref 26.0–34.0)
MCHC: 33.3 g/dL (ref 30.0–36.0)
MCV: 93.2 fL (ref 78.0–100.0)
Monocytes Absolute: 1.1 10*3/uL — ABNORMAL HIGH (ref 0.1–1.0)
Monocytes Relative: 9 % (ref 3–12)
NEUTROS ABS: 9.6 10*3/uL — AB (ref 1.7–7.7)
Neutrophils Relative %: 81 % — ABNORMAL HIGH (ref 43–77)
PLATELETS: 255 10*3/uL (ref 150–400)
RBC: 4.87 MIL/uL (ref 3.87–5.11)
RDW: 13.3 % (ref 11.5–15.5)
WBC: 12 10*3/uL — AB (ref 4.0–10.5)

## 2013-09-02 LAB — COMPREHENSIVE METABOLIC PANEL
ALT: 18 U/L (ref 0–35)
AST: 24 U/L (ref 0–37)
Albumin: 4 g/dL (ref 3.5–5.2)
Alkaline Phosphatase: 86 U/L (ref 39–117)
Anion gap: 14 (ref 5–15)
BILIRUBIN TOTAL: 0.3 mg/dL (ref 0.3–1.2)
BUN: 25 mg/dL — ABNORMAL HIGH (ref 6–23)
CALCIUM: 9.8 mg/dL (ref 8.4–10.5)
CHLORIDE: 104 meq/L (ref 96–112)
CO2: 24 meq/L (ref 19–32)
Creatinine, Ser: 0.79 mg/dL (ref 0.50–1.10)
GFR calc Af Amer: >90 mL/min (ref >90)
GFR calc non Af Amer: 89 mL/min — ABNORMAL LOW (ref >90)
Glucose, Bld: 121 mg/dL — ABNORMAL HIGH (ref 70–99)
Potassium: 4.1 mEq/L (ref 3.7–5.3)
SODIUM: 142 meq/L (ref 137–147)
Total Protein: 7.6 g/dL (ref 6.0–8.3)

## 2013-09-02 LAB — PROTIME-INR
INR: 0.9 (ref 0.00–1.49)
Prothrombin Time: 12.2 seconds (ref 11.6–15.2)

## 2013-09-02 LAB — POC OCCULT BLOOD, ED: Fecal Occult Bld: POSITIVE — AB

## 2013-09-02 LAB — TYPE AND SCREEN
ABO/RH(D): O POS
ANTIBODY SCREEN: NEGATIVE

## 2013-09-02 LAB — ABO/RH: ABO/RH(D): O POS

## 2013-09-02 LAB — LIPASE, BLOOD: Lipase: 35 U/L (ref 11–59)

## 2013-09-02 MED ORDER — ONDANSETRON HCL 4 MG/2ML IJ SOLN
4.0000 mg | Freq: Once | INTRAMUSCULAR | Status: AC
  Administered 2013-09-02: 4 mg via INTRAVENOUS
  Filled 2013-09-02: qty 2

## 2013-09-02 MED ORDER — SODIUM CHLORIDE 0.9 % IV BOLUS (SEPSIS)
500.0000 mL | Freq: Once | INTRAVENOUS | Status: AC
  Administered 2013-09-02: 500 mL via INTRAVENOUS

## 2013-09-02 MED ORDER — IOHEXOL 300 MG/ML  SOLN
50.0000 mL | Freq: Once | INTRAMUSCULAR | Status: AC | PRN
  Administered 2013-09-02: 50 mL via ORAL

## 2013-09-02 MED ORDER — HYDROMORPHONE HCL PF 1 MG/ML IJ SOLN
0.5000 mg | Freq: Once | INTRAMUSCULAR | Status: AC
  Administered 2013-09-02: 0.5 mg via INTRAVENOUS
  Filled 2013-09-02: qty 1

## 2013-09-02 MED ORDER — IOHEXOL 300 MG/ML  SOLN
100.0000 mL | Freq: Once | INTRAMUSCULAR | Status: AC | PRN
  Administered 2013-09-02: 100 mL via INTRAVENOUS

## 2013-09-02 NOTE — Discharge Instructions (Signed)
Bloody Stools  Bloody stools often mean that there is a problem in the digestive tract. Your caregiver may use the term "melena" to describe black, tarry, and bad smelling stools or "hematochezia" to describe red or maroon-colored stools. Blood seen in the stool can be caused by bleeding anywhere along the intestinal tract.   A black stool usually means that blood is coming from the upper part of the gastrointestinal tract (esophagus, stomach, or small bowel). Passing maroon-colored stools or bright red blood usually means that blood is coming from lower down in the large bowel or the rectum. However, sometimes massive bleeding in the stomach or small intestine can cause bright red bloody stools.   Consuming black licorice, lead, iron pills, medicines containing bismuth subsalicylate, or blueberries can also cause black stools. Your caregiver can test black stools to see if blood is present.  It is important that the cause of the bleeding be found. Treatment can then be started, and the problem can be corrected. Rectal bleeding may not be serious, but you should not assume everything is okay until you know the cause. It is very important to follow up with your caregiver or a specialist in gastrointestinal problems.  CAUSES   Blood in the stools can come from various underlying causes. Often, the cause is not found during your first visit. Testing is often needed to discover the cause of bleeding in the gastrointestinal tract. Causes range from simple to serious or even life-threatening. Possible causes include:  · Hemorrhoids. These are veins that are full of blood (engorged) in the rectum. They cause pain, inflammation, and may bleed.  · Anal fissures. These are areas of painful tearing which may bleed. They are often caused by passing hard stool.  · Diverticulosis. These are pouches that form on the colon over time, with age, and may bleed significantly.  · Diverticulitis. This is inflammation in areas with  diverticulosis. It can cause pain, fever, and bloody stools, although bleeding is rare.  · Proctitis and colitis. These are inflamed areas of the rectum or colon. They may cause pain, fever, and bloody stools.  · Polyps and cancer. Colon cancer is a leading cause of preventable cancer death. It often starts out as precancerous polyps that can be removed during a colonoscopy, preventing progression into cancer. Sometimes, polyps and cancer may cause rectal bleeding.  · Gastritis and ulcers. Bleeding from the upper gastrointestinal tract (near the stomach) may travel through the intestines and produce black, sometimes tarry, often bad smelling stools. In certain cases, if the bleeding is fast enough, the stools may not be black, but red and the condition may be life-threatening.  SYMPTOMS   You may have stools that are bright red and bloody, that are normal color with blood on them, or that are dark black and tarry. In some cases, you may only have blood in the toilet bowl. Any of these cases need medical care. You may also have:  · Pain at the anus or anywhere in the rectum.  · Lightheadedness or feeling faint.  · Extreme weakness.  · Nausea or vomiting.  · Fever.  DIAGNOSIS  Your caregiver may use the following methods to find the cause of your bleeding:  · Taking a medical history. Age is important. Older people tend to develop polyps and cancer more often. If there is anal pain and a hard, large stool associated with bleeding, a tear of the anus may be the cause. If blood drips into the toilet after a bowel movement, bleeding hemorrhoids may be the   problem. The color and frequency of the bleeding are additional considerations. In most cases, the medical history provides clues, but seldom the final answer.  · A visual and finger (digital) exam. Your caregiver will inspect the anal area, looking for tears and hemorrhoids. A finger exam can provide information when there is tenderness or a growth inside. In men, the  prostate is also examined.  · Endoscopy. Several types of small, long scopes (endoscopes) are used to view the colon.  ¨ In the office, your caregiver may use a rigid, or more commonly, a flexible viewing sigmoidoscope. This exam is called flexible sigmoidoscopy. It is performed in 5 to 10 minutes.  ¨ A more thorough exam is accomplished with a colonoscope. It allows your caregiver to view the entire 5 to 6 foot long colon. Medicine to help you relax (sedative) is usually given for this exam. Frequently, a bleeding lesion may be present beyond the reach of the sigmoidoscope. So, a colonoscopy may be the best exam to start with. Both exams are usually done on an outpatient basis. This means the patient does not stay overnight in the hospital or surgery center.  ¨ An upper endoscopy may be needed to examine your stomach. Sedation is used and a flexible endoscope is put in your mouth, down to your stomach.  · A barium enema X-ray. This is an X-ray exam. It uses liquid barium inserted by enema into the rectum. This test alone may not identify an actual bleeding point. X-rays highlight abnormal shadows, such as those made by lumps (tumors), diverticuli, or colitis.  TREATMENT   Treatment depends on the cause of your bleeding.   · For bleeding from the stomach or colon, the caregiver doing your endoscopy or colonoscopy may be able to stop the bleeding as part of the procedure.  · Inflammation or infection of the colon can be treated with medicines.  · Many rectal problems can be treated with creams, suppositories, or warm baths.  · Surgery is sometimes needed.  · Blood transfusions are sometimes needed if you have lost a lot of blood.  · For any bleeding problem, let your caregiver know if you take aspirin or other blood thinners regularly.  HOME CARE INSTRUCTIONS   · Take any medicines exactly as prescribed.  · Keep your stools soft by eating a diet high in fiber. Prunes (1 to 3 a day) work well for many people.  · Drink  enough water and fluids to keep your urine clear or pale yellow.  · Take sitz baths if advised. A sitz bath is when you sit in a bathtub with warm water for 10 to 15 minutes to soak, soothe, and cleanse the rectal area.  · If enemas or suppositories are advised, be sure you know how to use them. Tell your caregiver if you have problems with this.  · Monitor your bowel movements to look for signs of improvement or worsening.  SEEK MEDICAL CARE IF:   · You do not improve in the time expected.  · Your condition worsens after initial improvement.  · You develop any new symptoms.  SEEK IMMEDIATE MEDICAL CARE IF:   · You develop severe or prolonged rectal bleeding.  · You vomit blood.  · You feel weak or faint.  · You have a fever.  MAKE SURE YOU:  · Understand these instructions.  · Will watch your condition.  · Will get help right away if you are not doing well or get worse.    Document Released: 01/24/2002 Document Revised: 04/28/2011 Document Reviewed: 06/21/2010  [employer]® Patient Information ©2015 [employer]. This information is not intended to replace advice given to you by your health care provider. Make sure you discuss any questions you have with your health care provider.

## 2013-09-02 NOTE — ED Provider Notes (Addendum)
CSN: [id number]     Arrival date & time 09/02/13  1045 History   First MD Initiated Contact with Patient 09/02/13 1113     Chief Complaint  Patient presents with  . Rectal Bleeding     (Consider location/radiation/quality/duration/timing/severity/associated sxs/prior Treatment) Patient is a 58 y.o. female presenting with hematochezia. The history is provided by the patient.  Rectal Bleeding Quality:  Bright red Amount:  Moderate Duration:  5 hours Timing:  Constant Progression:  Unchanged Chronicity:  Recurrent Context: spontaneously   Similar prior episodes: yes   Relieved by:  Nothing Worsened by:  Nothing tried Ineffective treatments:  None tried Associated symptoms: no abdominal pain, no dizziness, no fever and no vomiting     Past Medical History  Diagnosis Date  . Hyperlipidemia   . HTN (hypertension)   . CAD (coronary artery disease)     Inferior MI 1996 tx with bare metal stent RCA. Repeat  anterior MI 2002 with LAD vasospasm. Repeat inferior MI 2003 with occlusion RCA with overlapping Cypher DES in RCA. Repeat cath 2003 with profound vasospasm LAd and Circumflex. Repeat cath 2007  with patent RCA and no disease in LAD or Circumflex.  . Cervical spine disease   . Adrenal tumor   . Myocardial infarction     times 4  . Benign tumor of adrenal gland   . Fibroid   . Osteopenia    Past Surgical History  Procedure Laterality Date  . Abdominal hysterectomy    . Back surgery    . Myomectomy      age 19   Family History  Problem Relation Age of Onset  . Alcohol abuse Mother   . Breast cancer Mother   . Alcohol abuse Father   . Cancer Father     lung  . Breast cancer Maternal Aunt   . Breast cancer Maternal Grandmother   . Breast cancer Maternal Aunt   . Breast cancer Cousin    History  Substance Use Topics  . Smoking status: Former Smoker -- 1.00 packs/day  . Smokeless tobacco: Never Used     Comment: currently using e-cigs  . Alcohol Use: No   OB  History   Grav Para Term Preterm Abortions TAB SAB Ect Mult Living   2    2  2    0     Review of Systems  Constitutional: Negative for fever and fatigue.  HENT: Negative for congestion and drooling.   Eyes: Negative for pain.  Respiratory: Negative for cough and shortness of breath.   Cardiovascular: Negative for chest pain.  Gastrointestinal: Positive for nausea and hematochezia. Negative for vomiting, abdominal pain and diarrhea.  Genitourinary: Negative for dysuria and hematuria.       Rectal bleeding  Musculoskeletal: Negative for back pain, gait problem and neck pain.  Skin: Negative for color change.  Neurological: Negative for dizziness and headaches.  Hematological: Negative for adenopathy.  Psychiatric/Behavioral: Negative for behavioral problems.  All other systems reviewed and are negative.     Allergies  Valium  Home Medications   Prior to Admission medications   Medication Sig Start Date End Date Taking? Authorizing Provider  aspirin 81 MG tablet Take 81 mg by mouth daily.     Yes Historical Provider, MD  atenolol (TENORMIN) 25 MG tablet Take 35 mg by mouth daily.   Yes Historical Provider, MD  clopidogrel (PLAVIX) 75 MG tablet Take 75 mg by mouth daily.   Yes Historical Provider, MD  diltiazem (DILTIAZEM CD)  120 MG 24 hr capsule Take 120 mg by mouth daily.   Yes Historical Provider, MD  folic acid (FOLVITE) 1 MG tablet Take 1 mg by mouth daily.   Yes Historical Provider, MD  isosorbide mononitrate (IMDUR) 15 mg TB24 24 hr tablet Take 15 mg by mouth daily.   Yes Historical Provider, MD  lisinopril (PRINIVIL,ZESTRIL) 20 MG tablet Take 20 mg by mouth daily.   Yes Historical Provider, MD  methotrexate (RHEUMATREX) 15 MG tablet Take 90 mg by mouth once a week. Caution: Chemotherapy. Protect from light. Takes on Sunday's   Yes Historical Provider, MD  omeprazole (PRILOSEC) 20 MG capsule Take 20 mg by mouth daily.   Yes Historical Provider, MD  simvastatin (ZOCOR) 40  MG tablet Take 40 mg by mouth at bedtime.   Yes Historical Provider, MD  fluticasone (FLONASE) 50 MCG/ACT nasal spray daily. 07/13/12   Historical Provider, MD   BP 112/61  Pulse 85  Temp(Src) 97.7 F (36.5 C) (Oral)  Resp 18  SpO2 97%  LMP 02/18/1995 Physical Exam  Nursing note and vitals reviewed. Constitutional: She is oriented to person, place, and time. She appears well-developed and well-nourished.  HENT:  Head: Normocephalic.  Mouth/Throat: Oropharynx is clear and moist. No oropharyngeal exudate.  Eyes: Conjunctivae and EOM are normal. Pupils are equal, round, and reactive to light.  Neck: Normal range of motion. Neck supple.  Cardiovascular: Normal rate, regular rhythm, normal heart sounds and intact distal pulses.  Exam reveals no gallop and no friction rub.   No murmur heard. Pulmonary/Chest: Effort normal and breath sounds normal. No respiratory distress. She has no wheezes.  Abdominal: Soft. Bowel sounds are normal. There is no tenderness. There is no rebound and no guarding.  Genitourinary:  Mild erythema of the external rectum. Normal rectal exam. Was unable to get an adequate stool specimen on glove for visual inspection. No gross blood seen.   Musculoskeletal: Normal range of motion. She exhibits no edema and no tenderness.  Neurological: She is alert and oriented to person, place, and time.  Skin: Skin is warm and dry.  Psychiatric: She has a normal mood and affect. Her behavior is normal.    ED Course  Procedures (including critical care time) Labs Review Labs Reviewed  CBC WITH DIFFERENTIAL - Abnormal; Notable for the following:    WBC 12.0 (*)    Hemoglobin 15.1 (*)    Neutrophils Relative % 81 (*)    Neutro Abs 9.6 (*)    Lymphocytes Relative 9 (*)    Monocytes Absolute 1.1 (*)    All other components within normal limits  COMPREHENSIVE METABOLIC PANEL - Abnormal; Notable for the following:    Glucose, Bld 121 (*)    BUN 25 (*)    GFR calc non Af Amer  89 (*)    All other components within normal limits  POC OCCULT BLOOD, ED - Abnormal; Notable for the following:    Fecal Occult Bld POSITIVE (*)    All other components within normal limits  PROTIME-INR  LIPASE, BLOOD  OCCULT BLOOD X 1 CARD TO LAB, STOOL  TYPE AND SCREEN  ABO/RH    Imaging Review Ct Abdomen Pelvis W Contrast  09/02/2013   CLINICAL DATA:  Pain.  EXAM: CT ABDOMEN AND PELVIS WITH CONTRAST  TECHNIQUE: Multidetector CT imaging of the abdomen and pelvis was performed using the standard protocol following bolus administration of intravenous contrast.  CONTRAST:  102mL OMNIPAQUE IOHEXOL 300 MG/ML SOLN, 170mL OMNIPAQUE IOHEXOL 300  MG/ML SOLN  COMPARISON:  MRI 11/05/2012.  FINDINGS: Liver normal. Spleen normal. Pancreas normal. No biliary distention. The gallbladder is nondistended .  Stable left adrenal nodules previously demonstrated to be adenomas again noted. No focal renal abnormality. Nonobstructive left nephrolithiasis. No evidence of urolithiasis. Bladder is nondistended. Uterus and adnexa unremarkable. No free pelvic fluid.  No significant adenopathy. Aorto iliac atherosclerotic vascular disease. Visceral vessels are patent.  Appendix normal. No evidence of bowel obstruction. Diverticulosis. No evidence of diverticulitis. No free air. The stomach is nondistended. Esophagogastric region is normal. Learning region is normal. No mesenteric mass. No evidence of abdominal wall significant hernia.  Lung bases are clear heart size normal. No acute bony abnormality. Degenerative changes lumbar spine and both hips.  IMPRESSION: 1.  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  2.  Nonobstructing left nephrolithiasis.  3. Diverticulosis. No diverticulitis. No acute abnormality identified.   Electronically Signed   By: Marcello Moores  Register   On: 09/02/2013 14:28     EKG Interpretation   Date/Time:  Friday September 02 2013 12:03:31 EDT Ventricular Rate:  70 PR Interval:  157 QRS Duration:  89 QT Interval:  399 QTC Calculation: 430 R Axis:   30 Text Interpretation:  Sinus rhythm Borderline repolarization abnormality  No significant change since last tracing Confirmed by Brinton Brandel  MD,  Jamera Vanloan ([id number]) on 09/02/2013 12:37:10 PM      MDM   Final diagnoses:  Rectal bleeding    11:44 AM 58 y.o. female w/ a hx of CAD on plavix who presents with bloating sensation and rectal bleeding. She states that the bloating began last night and she began having bright red bloody bowel movements at approximately 7 AM this morning. She states that she has had approximately 8 bloody bowel movements since then. She has had nausea but denies any vomiting or fever. She notes similar symptoms 3 years ago and states that she had a noncontributory workup by Dr. Collene Mares w/ GI. She is afebrile and vital signs are unremarkable here. Will get screening labs and imaging.  Hemeoccult pos.   Ct non-contrib. Pt had bm here which I visualized in the bowl. It does not appear grossly bloody and pt agrees it appears to be clearing. Labs otherwise interpreted and non-contrib.   2:47 PM:  I have discussed the diagnosis/risks/treatment options with the patient and believe the pt to be eligible for discharge home to follow-up with her GI doctor, Dr. Collene Mares next week. Pt is already on a ppi. We also discussed returning to the ED immediately if new or worsening sx occur. We discussed the sx which are most concerning (e.g., worsening of bloody bm's, lightheadedness, dizziness, fatigue, sx of anemia discussed) that necessitate immediate return. Medications administered to the patient during their visit and any new prescriptions provided to the patient are listed below.  Medications given during this visit Medications  ondansetron (ZOFRAN) injection 4 mg (4 mg Intravenous Given 09/02/13 1207)  HYDROmorphone (DILAUDID) injection 0.5 mg (0.5 mg Intravenous Given 09/02/13 1207)  sodium chloride 0.9 % bolus 500 mL (500 mLs Intravenous  New Bag/Given 09/02/13 1207)  iohexol (OMNIPAQUE) 300 MG/ML solution 50 mL (50 mLs Oral Contrast Given 09/02/13 1204)  iohexol (OMNIPAQUE) 300 MG/ML solution 100 mL (100 mLs Intravenous Contrast Given 09/02/13 1402)    New Prescriptions   No medications on file     Blanchard Kelch, MD 09/02/13 1503  Blanchard Kelch, MD 09/02/13 1525

## 2013-09-02 NOTE — ED Notes (Signed)
Per pt, started having abdominal distention and discomfort prior to bed last night.  Starting this morning, pt starting having runny rectal bleeding.  Pt states "urinating blood through rectum". Nausea with no vomiting.

## 2013-09-05 ENCOUNTER — Telehealth: Payer: Self-pay | Admitting: Cardiovascular Disease

## 2013-09-05 NOTE — Telephone Encounter (Signed)
Spoke with patient who states she started bleeding on Friday while at her home, presumably from her rectum.  Patient states she lost a considerable amount of bright red blood and a friend took her to the ER.  Patient states she is concerned about taking her blood thinning medications - Plavix, ASA, and Methotrexate.  Patient states in the past, the bleeding was presumed to be from a lesion in her colon although it was never identified on colonoscopy.  I advised patient that I will send message to Dr. [name] who is not in the office today for advice concerning the Plavix and ASA but that she will have to contact the prescriber of the Methotrexate regarding that medication.  Patient verbalized understanding and agreement.

## 2013-09-05 NOTE — Telephone Encounter (Signed)
I would have her hold her ASA and Plavix until seen by GI. Gerald Stabs

## 2013-09-05 NOTE — Telephone Encounter (Signed)
°  Patient has questions regarding blood thinner medications. She said she was at the hospital over the weekend and almost bleed to death, please call and advice.

## 2013-09-06 NOTE — Telephone Encounter (Signed)
Left message on home and cell numbers to call back 

## 2013-09-06 NOTE — Telephone Encounter (Signed)
Spoke with pt and gave her instructions from Dr. [name].

## 2013-09-13 ENCOUNTER — Encounter: Payer: Self-pay | Admitting: Cardiovascular Disease

## 2013-09-13 ENCOUNTER — Ambulatory Visit (INDEPENDENT_AMBULATORY_CARE_PROVIDER_SITE_OTHER): Payer: Managed Care, Other (non HMO) | Admitting: Cardiovascular Disease

## 2013-09-13 VITALS — BP 110/80 | HR 70 | Ht 67.0 in | Wt 169.0 lb

## 2013-09-13 DIAGNOSIS — I2510 Atherosclerotic heart disease of native coronary artery without angina pectoris: Secondary | ICD-10-CM

## 2013-09-13 DIAGNOSIS — I10 Essential (primary) hypertension: Secondary | ICD-10-CM

## 2013-09-13 DIAGNOSIS — E7800 Pure hypercholesterolemia, unspecified: Secondary | ICD-10-CM

## 2013-09-13 DIAGNOSIS — E785 Hyperlipidemia, unspecified: Secondary | ICD-10-CM

## 2013-09-13 DIAGNOSIS — K625 Hemorrhage of anus and rectum: Secondary | ICD-10-CM

## 2013-09-13 DIAGNOSIS — Z87891 Personal history of nicotine dependence: Secondary | ICD-10-CM

## 2013-09-13 DIAGNOSIS — F17201 Nicotine dependence, unspecified, in remission: Secondary | ICD-10-CM

## 2013-09-13 MED ORDER — LISINOPRIL 20 MG PO TABS: 20.0000 mg | ORAL_TABLET | Freq: Every day | ORAL | Status: DC

## 2013-09-13 MED ORDER — SIMVASTATIN 40 MG PO TABS: 40.0000 mg | ORAL_TABLET | Freq: Every day | ORAL | Status: DC

## 2013-09-13 MED ORDER — CLOPIDOGREL BISULFATE 75 MG PO TABS: 75.0000 mg | ORAL_TABLET | Freq: Every day | ORAL | Status: DC

## 2013-09-13 MED ORDER — ATENOLOL 25 MG PO TABS: 25.0000 mg | ORAL_TABLET | Freq: Every day | ORAL | Status: DC

## 2013-09-13 MED ORDER — ACETAMINOPHEN-CAFFEINE 500-65 MG PO TABS: 1.0000 | ORAL_TABLET | Freq: Four times a day (QID) | ORAL | Status: DC | PRN

## 2013-09-13 MED ORDER — ISOSORBIDE MONONITRATE ER 30 MG PO TB24: 15.0000 mg | ORAL_TABLET | Freq: Every day | ORAL | Status: DC

## 2013-09-13 MED ORDER — DILTIAZEM HCL ER COATED BEADS 120 MG PO CP24: 120.0000 mg | ORAL_CAPSULE | Freq: Every day | ORAL | Status: DC

## 2013-09-13 NOTE — Patient Instructions (Addendum)
Your physician wants you to follow-up in:  12 months. You will receive a reminder letter in the mail two months in advance. If you don't receive a letter, please call our office to schedule the follow-up appointment.  Your physician recommends that you return for fasting lab work later this week. The lab opens at 7:30 AM. Lipid and Liver profiles  Your physician has recommended you make the following change in your medication:  Stop Aspirin.  Continue Clopidogrel.

## 2013-09-13 NOTE — Progress Notes (Signed)
History of Present Illness: 58 yo female with history of CAD, HLD, HTN, tobacco abuse here today for cardiac follow up. She has been followed in the past by Dr. Doreatha Lew. I met her for the first time November 2012. Her CAD dates back to 1996 when she had a stent placed in her RCA. She had an anterior MI with cardiac arrest (v. Fib) in 2002. Cath showed a 75% distal LAD narrowing and this was managed conservatively with relook cath two days later showing 50% distal stenosis. She then had an inferior MI in 2003 with placement of overlapping Cypher drug eluting stents in the RCA. The LAD and Circumflex had mild disease per report. Four days later, she had severe chest pain, cath showed severe spasm of the LAD and Circumflex which resolved with IC vasodilators. Her last cath was in 2007 and showed patency of stents in the RCA and no other obstructive disease in the LAD and Circumflex. Her last stress test was in November 2009 and showed normal LVEF with no evidence of ischemia. She was seen in the ED 09/02/13 with bright red blood per rectum. Her ASA and Plavix were held. Colonoscopy 09/09/13 per Dr. Collene Mares with polyps removed but no other bleeding source identified.   She is here today for follow up. No chest pain or SOB. She has not smoked cigarettes since March 2013. She has been using an electronic cigarette. No dizziness, near syncope or syncope.   Primary Care Physician: None  Last Lipid Profile:Lipid Panel     Component Value Date/Time   CHOL 173 02/19/2012 1012   TRIG 148.0 02/19/2012 1012   HDL 60.60 02/19/2012 1012   CHOLHDL 3 02/19/2012 1012   VLDL 29.6 02/19/2012 1012   LDLCALC 83 02/19/2012 1012    Past Medical History  Diagnosis Date  . Hyperlipidemia   . HTN (hypertension)   . CAD (coronary artery disease)     Inferior MI 1996 tx with bare metal stent RCA. Repeat  anterior MI 2002 with LAD vasospasm. Repeat inferior MI 2003 with occlusion RCA with overlapping Cypher DES in RCA. Repeat cath 2003  with profound vasospasm LAd and Circumflex. Repeat cath 2007  with patent RCA and no disease in LAD or Circumflex.  . Cervical spine disease   . Adrenal tumor   . Myocardial infarction     times 4  . Benign tumor of adrenal gland   . Fibroid   . Osteopenia     Past Surgical History  Procedure Laterality Date  . Abdominal hysterectomy    . Back surgery    . Myomectomy      age 40    Current Outpatient Prescriptions  Medication Sig Dispense Refill  . aspirin 81 MG tablet Take 81 mg by mouth daily.        . Aspirin-Acetaminophen-Caffeine (EXCEDRIN PO) Take 3 tablets by mouth daily.      Marland Kitchen atenolol (TENORMIN) 25 MG tablet Take 35 mg by mouth at bedtime.       . clopidogrel (PLAVIX) 75 MG tablet Take 75 mg by mouth daily.      Marland Kitchen diltiazem (DILTIAZEM CD) 120 MG 24 hr capsule Take 120 mg by mouth daily.      . folic acid (FOLVITE) 1 MG tablet Take 1 mg by mouth daily.      . isosorbide mononitrate (IMDUR) 15 mg TB24 24 hr tablet Take 15 mg by mouth daily.      Marland Kitchen lisinopril (PRINIVIL,ZESTRIL) 20 MG  tablet Take 20 mg by mouth daily.      . methotrexate (RHEUMATREX) 2.5 MG tablet Takes 8 tablets at 2.5 mg once a week      . Multiple Vitamins-Minerals (CENTRUM SILVER PO) Take 1 tablet by mouth at bedtime.      . omeprazole (PRILOSEC) 20 MG capsule Take 20 mg by mouth daily.      . polyethylene glycol-electrolytes (NULYTELY/GOLYTELY) 420 G solution       . simvastatin (ZOCOR) 40 MG tablet Take 40 mg by mouth at bedtime.       No current facility-administered medications for this visit.    Allergies  Allergen Reactions  . Other Diarrhea    Peppers   . Tomato Diarrhea  . Valium Other (See Comments)    Affects her behavioral    History   Social History  . Marital Status: Married    Spouse Name: N/A    Number of Children: 0  . Years of Education: N/A   Occupational History  .     Social History Main Topics  . Smoking status: Former Smoker -- 1.00 packs/day  . Smokeless  tobacco: Never Used     Comment: currently using e-cigs  . Alcohol Use: No  . Drug Use: No  . Sexual Activity: Yes    Partners: Male    Birth Control/ Protection: Surgical     Comment: TAH   Other Topics Concern  . Not on file   Social History Narrative  . No narrative on file    Family History  Problem Relation Age of Onset  . Alcohol abuse Mother   . Breast cancer Mother   . Alcohol abuse Father   . Cancer Father     lung  . Breast cancer Maternal Aunt   . Breast cancer Maternal Grandmother   . Breast cancer Maternal Aunt   . Breast cancer Cousin     Review of Systems:  As stated in the HPI and otherwise negative.   BP 110/80  Pulse 70  Ht 5' 7" (1.702 m)  Wt 169 lb (76.658 kg)  BMI 26.46 kg/m2  LMP 02/18/1995  Physical Examination: General: Well developed, well nourished, NAD HEENT: OP clear, mucus membranes moist SKIN: warm, dry. No rashes. Neuro: No focal deficits Musculoskeletal: Muscle strength 5/5 all ext Psychiatric: Mood and affect normal Neck: No JVD, no carotid bruits, no thyromegaly, no lymphadenopathy. Lungs:Clear bilaterally, no wheezes, rhonci, crackles Cardiovascular: Regular rate and rhythm. No murmurs, gallops or rubs. Abdomen:Soft. Bowel sounds present. Non-tender.  Extremities: No lower extremity edema. Pulses are 2 + in the bilateral DP/PT.  Assessment and Plan:   1. CAD: Stable. No chest pains. Doing well on current medical therapy. She is known to have coronary vasospasm. She is trying to stop using e-cigarettes. With recent GI bleeding, will stop ASA but will continue Plavix 75 mg daily. (No source of bleeding was seen). Plavix given overlapping first generation DES.   2. HTN: BP well controlled. No changes today.   3. Tobacco abuse, in remission: She is using an electronic cigarette occasionally.   4. Hyperlipidemia: Lipids well controlled. Continue statin.  Check lipids this summer, LFTs.   5. Bright red blood per rectum:  Resolved. Continue Plavix. Will stop ASA.     

## 2013-09-15 ENCOUNTER — Other Ambulatory Visit: Payer: Managed Care, Other (non HMO)

## 2013-10-25 ENCOUNTER — Other Ambulatory Visit: Payer: Self-pay

## 2013-10-25 DIAGNOSIS — Z1231 Encounter for screening mammogram for malignant neoplasm of breast: Secondary | ICD-10-CM

## 2013-11-10 ENCOUNTER — Ambulatory Visit: Payer: Managed Care, Other (non HMO)

## 2013-11-10 ENCOUNTER — Ambulatory Visit
Admission: RE | Admit: 2013-11-10 | Discharge: 2013-11-10 | Disposition: A | Discharge status: Home / Self Care | Payer: Managed Care, Other (non HMO) | Source: Ambulatory Visit

## 2013-11-10 DIAGNOSIS — Z1231 Encounter for screening mammogram for malignant neoplasm of breast: Secondary | ICD-10-CM

## 2013-12-19 ENCOUNTER — Encounter: Payer: Self-pay | Admitting: Cardiovascular Disease

## 2014-07-14 ENCOUNTER — Telehealth: Payer: Self-pay | Admitting: Cardiovascular Disease

## 2014-07-14 NOTE — Telephone Encounter (Signed)
Follow Up   Pt scheduled an appt in august wth NP, Cecille Rubin. Pt is inquiring if labs are needed//sr

## 2014-07-14 NOTE — Telephone Encounter (Signed)
The pt was due to have a lipid and liver drawn in July 2015 but these were not done. I will forward this message to Dr [name] to review the pt's chart and determine if any other labs need to be checked prior to her appointment.

## 2014-07-14 NOTE — Telephone Encounter (Signed)
Left message on pt's voicemail to contact the office and schedule appointment for lab work in July (lipid and liver).

## 2014-07-14 NOTE — Telephone Encounter (Signed)
Just lipids and LFTs. Thanks, chris

## 2014-08-17 NOTE — Telephone Encounter (Signed)
Left message to call back  

## 2014-09-07 NOTE — Telephone Encounter (Signed)
Attempted pt's home phone. Phone rang several times with no answer and no voicemail. Unable to leave message.

## 2014-09-13 ENCOUNTER — Other Ambulatory Visit: Payer: Self-pay | Admitting: Rheumatology

## 2014-09-13 ENCOUNTER — Ambulatory Visit
Admission: RE | Admit: 2014-09-13 | Discharge: 2014-09-13 | Disposition: A | Discharge status: Home / Self Care | Payer: Managed Care, Other (non HMO) | Source: Ambulatory Visit | Attending: Rheumatology | Admitting: Rheumatology

## 2014-09-13 ENCOUNTER — Other Ambulatory Visit (INDEPENDENT_AMBULATORY_CARE_PROVIDER_SITE_OTHER): Payer: Managed Care, Other (non HMO) | Admitting: *Deleted

## 2014-09-13 DIAGNOSIS — M069 Rheumatoid arthritis, unspecified: Secondary | ICD-10-CM

## 2014-09-13 DIAGNOSIS — E7800 Pure hypercholesterolemia, unspecified: Secondary | ICD-10-CM

## 2014-09-13 DIAGNOSIS — M8580 Other specified disorders of bone density and structure, unspecified site: Secondary | ICD-10-CM

## 2014-09-13 LAB — LIPID PANEL
CHOLESTEROL: 154 mg/dL (ref 0–200)
HDL: 62.3 mg/dL (ref >39.00)
LDL Cholesterol: 74 mg/dL (ref 0–99)
NonHDL: 91.7
Total CHOL/HDL Ratio: 2
Triglycerides: 88 mg/dL (ref 0.0–149.0)
VLDL: 17.6 mg/dL (ref 0.0–40.0)

## 2014-09-14 NOTE — Telephone Encounter (Signed)
Lipid profile was checked on July 27,2016. Pt seeing Truitt Merle, NP on August 2,2016

## 2014-09-15 ENCOUNTER — Other Ambulatory Visit (INDEPENDENT_AMBULATORY_CARE_PROVIDER_SITE_OTHER): Payer: Managed Care, Other (non HMO)

## 2014-09-15 DIAGNOSIS — Z724 Inappropriate diet and eating habits: Secondary | ICD-10-CM | POA: Diagnosis not present

## 2014-09-15 LAB — HEPATIC FUNCTION PANEL
ALK PHOS: 63 U/L (ref 39–117)
ALT: 13 U/L (ref 0–35)
AST: 19 U/L (ref 0–37)
Albumin: 4.2 g/dL (ref 3.5–5.2)
Bilirubin, Direct: 0.1 mg/dL (ref 0.0–0.3)
Total Bilirubin: 0.4 mg/dL (ref 0.2–1.2)
Total Protein: 6.8 g/dL (ref 6.0–8.3)

## 2014-09-15 NOTE — Telephone Encounter (Signed)
Add on form faxed to lab requesting liver profile be added to lab work done 7/27

## 2014-09-18 ENCOUNTER — Ambulatory Visit
Admission: RE | Admit: 2014-09-18 | Discharge: 2014-09-18 | Disposition: A | Discharge status: Home / Self Care | Payer: Managed Care, Other (non HMO) | Source: Ambulatory Visit | Attending: Rheumatology | Admitting: Rheumatology

## 2014-09-18 DIAGNOSIS — M8580 Other specified disorders of bone density and structure, unspecified site: Secondary | ICD-10-CM

## 2014-09-19 ENCOUNTER — Ambulatory Visit (INDEPENDENT_AMBULATORY_CARE_PROVIDER_SITE_OTHER): Payer: Managed Care, Other (non HMO) | Admitting: Nurse Practitioner

## 2014-09-19 ENCOUNTER — Encounter: Payer: Self-pay | Admitting: Nurse Practitioner

## 2014-09-19 VITALS — BP 140/90 | HR 50 | Ht 66.5 in | Wt 166.8 lb

## 2014-09-19 DIAGNOSIS — I10 Essential (primary) hypertension: Secondary | ICD-10-CM

## 2014-09-19 DIAGNOSIS — E7800 Pure hypercholesterolemia, unspecified: Secondary | ICD-10-CM

## 2014-09-19 DIAGNOSIS — I2510 Atherosclerotic heart disease of native coronary artery without angina pectoris: Secondary | ICD-10-CM

## 2014-09-19 MED ORDER — ESCITALOPRAM OXALATE 10 MG PO TABS: 10.0000 mg | ORAL_TABLET | Freq: Every day | ORAL | Status: DC

## 2014-09-19 NOTE — Patient Instructions (Signed)
We will be checking the following labs today - NONE  Get your RA doctor to send me your labs   Medication Instructions:    Continue with your current medicines.  I am restarting your Lexapro - this is to your mail order and local pharmacy     Testing/Procedures To Be Arranged:  N/A  Follow-Up:   See me in 3 months    Other Special Instructions:   N/A  Call the [place] office at [phone] if you have any questions, problems or concerns.

## 2014-09-19 NOTE — Progress Notes (Signed)
CARDIOLOGY OFFICE NOTE  Date:  09/19/2014    Donna Bullock Date of Birth: 1955-05-16 Medical Record #[id number]  PCP:  Jani Gravel, MD  Cardiologist:  [facility]    Chief Complaint  Patient presents with  . Coronary Artery Disease    Seen for Dr. [name]    History of Present Illness: Donna Bullock is a 59 y.o. female who presents today for a one year check. Seen for Dr. [name]. She is a former patient of Dr. Susa Simmonds that I previously cared for. She has a history of CAD, HLD, HTN, and tobacco abuse.  Her CAD dates back to 1996 when she had a stent placed in her RCA. She had an anterior MI with cardiac arrest (v. Fib) in 2002. Cath showed a 75% distal LAD narrowing and this was managed conservatively with relook cath two days later showing 50% distal stenosis. She then had an inferior MI in 2003 with placement of overlapping Cypher drug eluting stents in the RCA. The LAD and Circumflex had mild disease per report. Four days later, she had severe chest pain, cath showed severe spasm of the LAD and Circumflex which resolved with IC vasodilators. Her last cath was in 2007 and showed patency of stents in the RCA and no other obstructive disease in the LAD and Circumflex. Her last stress test was in November 2009 and showed normal LVEF with no evidence of ischemia. She was seen in the ED 09/02/13 with bright red blood per rectum. Her ASA and Plavix were held. Colonoscopy 09/09/13 per Dr. Collene Mares with polyps removed but no other bleeding source identified. Aspirin has been stopped.   Her other issues include RA, HLD, HTN and past tobacco abuse along with depression/anxiety.  I have not seen her in 5 1/2 years. She has since gotten married. Stopped smoking.   Last saw Dr. [name] a year ago - was doing ok from our standpoint.  Comes in today. Here with her husband - Donna Bullock who also used to be seen by me. She is doing well. Not smoking. No chest pain but has some vague flutters and  chest tightness - she feels it is more related to her RA and not exercising due to the heat and a good friend (that I also cared for) died last month. She has lost weight due to stress with [name] having knee surgery that did not go to well. She says she is back gaining some. She is not interested in proceeding with stress testing at this time.Wants to get back on her Lexapro. Has switched RA doctors and has most of her labs with him.    Past Medical History  Diagnosis Date  . Hyperlipidemia   . HTN (hypertension)   . CAD (coronary artery disease)     Inferior MI 1996 tx with bare metal stent RCA. Repeat  anterior MI 2002 with LAD vasospasm. Repeat inferior MI 2003 with occlusion RCA with overlapping Cypher DES in RCA. Repeat cath 2003 with profound vasospasm LAd and Circumflex. Repeat cath 2007  with patent RCA and no disease in LAD or Circumflex.  . Cervical spine disease   . Adrenal tumor   . Myocardial infarction     times 4  . Benign tumor of adrenal gland   . Fibroid   . Osteopenia     Past Surgical History  Procedure Laterality Date  . Abdominal hysterectomy    . Back surgery    . Myomectomy  age 62     Medications: Current Outpatient Prescriptions  Medication Sig Dispense Refill  . Acetaminophen-Caffeine 500-65 MG TABS Take 1 tablet by mouth every 6 (six) hours as needed. 60 each 0  . atenolol (TENORMIN) 25 MG tablet Take 1 tablet (25 mg total) by mouth daily. 90 tablet 3  . clopidogrel (PLAVIX) 75 MG tablet Take 1 tablet (75 mg total) by mouth daily. 90 tablet 3  . diltiazem (DILTIAZEM CD) 120 MG 24 hr capsule Take 1 capsule (120 mg total) by mouth daily. 90 capsule 3  . ENBREL SURECLICK 50 MG/ML injection Inject into the skin once a week. Dose not know dose    . folic acid (FOLVITE) 1 MG tablet Take 1 mg by mouth daily.    . isosorbide mononitrate (IMDUR) 30 MG 24 hr tablet Take 0.5 tablets (15 mg total) by mouth daily. 45 tablet 3  . lisinopril (PRINIVIL,ZESTRIL) 20  MG tablet Take 1 tablet (20 mg total) by mouth daily. 90 tablet 3  . methotrexate (RHEUMATREX) 2.5 MG tablet Takes 8 tablets at 2.5 mg once a week    . Methotrexate, Anti-Rheumatic, (RHEUMATREX PO) Inject 1 Dose into the skin once a week. 1.0 ml    . Multiple Vitamins-Minerals (CENTRUM SILVER PO) Take 1 tablet by mouth at bedtime.    Marland Kitchen omeprazole (PRILOSEC) 20 MG capsule Take 20 mg by mouth daily.    . polyethylene glycol-electrolytes (NULYTELY/GOLYTELY) 420 G solution     . simvastatin (ZOCOR) 40 MG tablet Take 1 tablet (40 mg total) by mouth at bedtime. 90 tablet 3  . valACYclovir (VALTREX) 1000 MG tablet Take 1,000 mg by mouth daily.  1   No current facility-administered medications for this visit.    Allergies: Allergies  Allergen Reactions  . Other Diarrhea    Peppers   . Tomato Diarrhea  . Valium Other (See Comments)    Affects her behavioral    Social History: The patient  reports that she has quit smoking. She has never used smokeless tobacco. She reports that she does not drink alcohol or use illicit drugs.   Family History: The patient's family history includes Alcohol abuse in her father and mother; Breast cancer in her cousin, maternal aunt, maternal aunt, maternal grandmother, and mother; Cancer in her father.   Review of Systems: Please see the history of present illness.   Otherwise, the review of systems is positive for none.   All other systems are reviewed and negative.   Physical Exam: VS:  BP 140/90 mmHg  Pulse 50  Ht 5' 6.5" (1.689 m)  Wt 166 lb 12.8 oz (75.66 kg)  BMI 26.52 kg/m2  LMP 02/18/1995 .  BMI Body mass index is 26.52 kg/(m^2).  Wt Readings from Last 3 Encounters:  09/19/14 166 lb 12.8 oz (75.66 kg)  09/13/13 169 lb (76.658 kg)  02/21/13 180 lb (81.647 kg)    General: Pleasant. Well developed, well nourished and in no acute distress.  HEENT: Normal. Neck: Supple, no JVD, carotid bruits, or masses noted.  Cardiac: Regular rate and rhythm.  No murmurs, rubs, or gallops. No edema.  Respiratory:  Lungs are clear to auscultation bilaterally with normal work of breathing.  GI: Soft and nontender.  MS: No deformity or atrophy. Gait and ROM intact. Skin: Warm and dry. Color is normal.  Neuro:  Strength and sensation are intact and no gross focal deficits noted.  Psych: Alert, appropriate and with normal affect.   LABORATORY DATA:  EKG:  EKG is ordered today. This demonstrates sinus brady with non specific T wave changes.  Lab Results  Component Value Date   WBC 12.0* 09/02/2013   HGB 15.1* 09/02/2013   HCT 45.4 09/02/2013   PLT 255 09/02/2013   GLUCOSE 121* 09/02/2013   CHOL 154 09/13/2014   TRIG 88.0 09/13/2014   HDL 62.30 09/13/2014   LDLCALC 74 09/13/2014   ALT 13 09/15/2014   AST 19 09/15/2014   NA 142 09/02/2013   K 4.1 09/02/2013   CL 104 09/02/2013   CREATININE 0.79 09/02/2013   BUN 25* 09/02/2013   CO2 24 09/02/2013   INR 0.90 09/02/2013    BNP (last 3 results) No results for input(s): BNP in the last 8760 hours.  ProBNP (last 3 results) No results for input(s): PROBNP in the last 8760 hours.   Other Studies Reviewed Today:  My last OV note from 01/2010  Assessment/Plan: 1. CAD: Stable. No chest pains. Doing well on current medical therapy. She is known to have coronary vasospasm. Continues on Plavix given overlapping first generation DES. No longer on aspirin due to prior GI bleeding. She is doing well but with some vague chest pains/flutters - she really does not want to proceed with stress testing but wants some time to get back to her exercise program - I will see her back in 3 months to recheck and will consider updating her Myoview if her symptoms worsen/persist.   2. HTN: BP well controlled. No changes today.   3. Tobacco abuse, in remission: She is using an electronic cigarette occasionally.   4. Hyperlipidemia: Lipids well controlled. Continue statin. Needs lab today  5. Bright red  blood per rectum: Resolved. Continue Plavix.   6. Depression - she would really like to get back on her Zoloft - I have put her on this in the past - will restart.    Current medicines are reviewed with the patient today.  The patient does not have concerns regarding medicines other than what has been noted above.  The following changes have been made:  See above.  Labs/ tests ordered today include:   No orders of the defined types were placed in this encounter.     Disposition:   FU with me in 3 months.   Patient is agreeable to this plan and will call if any problems develop in the interim.   Signed: Burtis Junes, RN, ANP-C 09/19/2014 9:33 AM  [facility] [address] [place], [place]  [address] Phone: [phone] Fax: [phone]

## 2014-10-04 ENCOUNTER — Telehealth: Payer: Self-pay | Admitting: Nurse Practitioner

## 2014-10-04 NOTE — Telephone Encounter (Signed)
S/w pt is aware MRI is ok that stents are made of titanium

## 2014-10-04 NOTE — Telephone Encounter (Signed)
New message     Pt is to have MRI on August 23rd and needs to know if the stints that she has will interfere with MRI that is scheduled Please call to discuss

## 2014-10-04 NOTE — Telephone Encounter (Signed)
No an MRI should be fine. They are made of titanium.

## 2014-10-10 ENCOUNTER — Other Ambulatory Visit: Payer: Self-pay | Admitting: Cardiovascular Disease

## 2014-10-17 ENCOUNTER — Telehealth: Payer: Self-pay | Admitting: Nurse Practitioner

## 2014-10-17 NOTE — Telephone Encounter (Signed)
Walk in pt form-[facility]-Clearance dropped off ok to hold till Donna Bullock is back in office.

## 2014-10-19 ENCOUNTER — Telehealth: Payer: Self-pay | Admitting: Nurse Practitioner

## 2014-10-19 NOTE — Telephone Encounter (Signed)
Reviewed last note by Truitt Merle, NP  Patient needs stress test for risk stratification Arrange ETT-Myoview.  If she does not feel that she can walk on treadmill, schedule Lexiscan Myoview instead. Try to get scheduled as soon as possible as this is for surgical clearance. Richardson Dopp, PA-C   10/19/2014 4:17 PM

## 2014-10-19 NOTE — Telephone Encounter (Signed)
New message      Pt dropped off clearance form to have shoulder surgery.  Has it been completed and send to Dr Maxie Better?

## 2014-10-19 NOTE — Telephone Encounter (Signed)
Called patient back.  Patient is wanting to expedite the clearance form due to increased shoulder pain and wanting to have her surgery.  Located form and placed in [employer] box in triage room.  Patient aware of Cecille Rubin and Dr [name] being out of the office.

## 2014-10-20 ENCOUNTER — Other Ambulatory Visit: Payer: Self-pay | Admitting: *Deleted

## 2014-10-20 DIAGNOSIS — Z01818 Encounter for other preprocedural examination: Secondary | ICD-10-CM

## 2014-10-20 NOTE — Telephone Encounter (Signed)
LM to call back to inform of need for stress test needed for surgery clearance.

## 2014-10-24 ENCOUNTER — Other Ambulatory Visit: Payer: Self-pay | Admitting: Nurse Practitioner

## 2014-10-24 DIAGNOSIS — Z01818 Encounter for other preprocedural examination: Secondary | ICD-10-CM

## 2014-10-24 NOTE — Telephone Encounter (Signed)
Patient is trying to get clearance for a shoulder surgery from Truitt Merle.  Talked to Cecille Rubin, says she will follow-up on this.

## 2014-10-24 NOTE — Telephone Encounter (Signed)
Patient is waiting for a call to schedule her myoview. Ebony with [facility] will be calling to schedule.

## 2014-10-25 ENCOUNTER — Telehealth (HOSPITAL_COMMUNITY): Payer: Self-pay

## 2014-10-25 NOTE — Telephone Encounter (Signed)
Encounter complete. 

## 2014-10-26 ENCOUNTER — Ambulatory Visit (HOSPITAL_COMMUNITY)
Admission: RE | Admit: 2014-10-26 | Discharge: 2014-10-26 | Disposition: A | Discharge status: Home / Self Care | Payer: Managed Care, Other (non HMO) | Source: Ambulatory Visit | Attending: Cardiovascular Disease | Admitting: Cardiovascular Disease

## 2014-10-26 DIAGNOSIS — R079 Chest pain, unspecified: Secondary | ICD-10-CM | POA: Insufficient documentation

## 2014-10-26 DIAGNOSIS — I252 Old myocardial infarction: Secondary | ICD-10-CM | POA: Diagnosis not present

## 2014-10-26 DIAGNOSIS — I2510 Atherosclerotic heart disease of native coronary artery without angina pectoris: Secondary | ICD-10-CM | POA: Diagnosis not present

## 2014-10-26 DIAGNOSIS — R002 Palpitations: Secondary | ICD-10-CM | POA: Diagnosis not present

## 2014-10-26 DIAGNOSIS — R9439 Abnormal result of other cardiovascular function study: Secondary | ICD-10-CM | POA: Insufficient documentation

## 2014-10-26 DIAGNOSIS — Z955 Presence of coronary angioplasty implant and graft: Secondary | ICD-10-CM | POA: Insufficient documentation

## 2014-10-26 DIAGNOSIS — Z01818 Encounter for other preprocedural examination: Secondary | ICD-10-CM | POA: Diagnosis not present

## 2014-10-26 DIAGNOSIS — I10 Essential (primary) hypertension: Secondary | ICD-10-CM | POA: Diagnosis not present

## 2014-10-26 DIAGNOSIS — F17200 Nicotine dependence, unspecified, uncomplicated: Secondary | ICD-10-CM | POA: Diagnosis not present

## 2014-10-26 MED ORDER — TECHNETIUM TC 99M SESTAMIBI GENERIC - CARDIOLITE
32.0000 | Freq: Once | INTRAVENOUS | Status: AC | PRN
  Administered 2014-10-26: 32 via INTRAVENOUS

## 2014-10-26 MED ORDER — TECHNETIUM TC 99M SESTAMIBI GENERIC - CARDIOLITE
10.8000 | Freq: Once | INTRAVENOUS | Status: AC | PRN
  Administered 2014-10-26: 11 via INTRAVENOUS

## 2014-10-26 MED ORDER — REGADENOSON 0.4 MG/5ML IV SOLN
0.4000 mg | Freq: Once | INTRAVENOUS | Status: AC
  Administered 2014-10-26: 0.4 mg via INTRAVENOUS

## 2014-10-27 LAB — MYOCARDIAL PERFUSION IMAGING
LV dias vol: 129 mL
LV sys vol: 68 mL
Peak HR: 75 {beats}/min
Rest HR: 51 {beats}/min
SDS: 1
SRS: 8
SSS: 9
TID: 1.05

## 2014-10-31 ENCOUNTER — Telehealth: Payer: Self-pay | Admitting: *Deleted

## 2014-10-31 NOTE — Telephone Encounter (Signed)
Will fax paperwork and myoview to [facility] @ [phone] for approved surgical clearance.

## 2014-11-01 ENCOUNTER — Other Ambulatory Visit: Payer: Self-pay

## 2014-11-01 DIAGNOSIS — Z1231 Encounter for screening mammogram for malignant neoplasm of breast: Secondary | ICD-10-CM

## 2014-11-06 ENCOUNTER — Ambulatory Visit: Payer: Self-pay | Admitting: Orthopedic Surgery

## 2014-11-06 NOTE — Progress Notes (Signed)
Please put orders in Epic surgery 11-16-14 pre op 11-14-14 Thanks

## 2014-11-07 ENCOUNTER — Encounter: Payer: Self-pay | Admitting: *Deleted

## 2014-11-07 ENCOUNTER — Ambulatory Visit: Payer: Self-pay | Admitting: Orthopedic Surgery

## 2014-11-07 NOTE — H&P (Signed)
Donna Bullock is an 59 y.o. female.   Chief Complaint: L shoulder pain HPI: The patient is a 59 year old female who presents today for follow up of their shoulder. The patient is being followed for their left shoulder pain and impingement. They are 7 1/2 months out from when symptoms began. Symptoms reported today include: pain. Current treatment includes: activity modification. The following medication has been used for pain control: none. The patient presents today following MRI. The patient has not gotten any relief of their symptoms with Cortisone injections.  Donna Bullock follows up to review MRI of left shoulder. She is here with her husband, Donna Bullock, also our patient. She reports ongoing symptoms of left shoulder. It has been about seven and a half months now. No specific injury. She had no relief with subacromial steroid injection that I have done over a month ago. She has been in [place] a lot recently. Her sister just got diagnosed with an acute form of B-cell leukemia and so she has been back and forth. Her sister is getting radiation now, she will be in the hospital for several weeks. Her sister does also have her partner up there as a caregiver as well, but Cassidey is worried as far as any recommendations for surgery timing and all of that with her sister.   Past Medical History  Diagnosis Date  . Hyperlipidemia   . HTN (hypertension)   . CAD (coronary artery disease)     Inferior MI 1996 tx with bare metal stent RCA. Repeat  anterior MI 2002 with LAD vasospasm. Repeat inferior MI 2003 with occlusion RCA with overlapping Cypher DES in RCA. Repeat cath 2003 with profound vasospasm LAd and Circumflex. Repeat cath 2007  with patent RCA and no disease in LAD or Circumflex.  . Cervical spine disease   . Adrenal tumor   . Myocardial infarction     times 4  . Benign tumor of adrenal gland   . Fibroid   . Osteopenia     Past Surgical History  Procedure Laterality Date  .  Abdominal hysterectomy    . Back surgery    . Myomectomy      age 2    Family History  Problem Relation Age of Onset  . Alcohol abuse Mother   . Breast cancer Mother   . Alcohol abuse Father   . Cancer Father     lung  . Breast cancer Maternal Aunt   . Breast cancer Maternal Grandmother   . Breast cancer Maternal Aunt   . Breast cancer Cousin    Social History:  reports that she has quit smoking. She has never used smokeless tobacco. She reports that she does not drink alcohol or use illicit drugs.  Allergies:  Allergies  Allergen Reactions  . Other Diarrhea    Peppers   . Tomato Diarrhea  . Valium Other (See Comments)    Affects her behavioral     (Not in a hospital admission)  No results found for this or any previous visit (from the past 48 hour(s)). No results found.  Review of Systems  Constitutional: Negative.   HENT: Negative.   Eyes: Negative.   Respiratory: Negative.   Cardiovascular: Negative.   Gastrointestinal: Negative.   Genitourinary: Negative.   Musculoskeletal: Positive for joint pain.  Skin: Negative.   Neurological: Negative.   Psychiatric/Behavioral: Negative.     Last menstrual period 02/18/1995. Physical Exam  Constitutional: She is oriented to person, place, and  time. She appears well-developed and well-nourished.  HENT:  Head: Normocephalic.  Eyes: Pupils are equal, round, and reactive to light.  Neck: Normal range of motion.  Cardiovascular: Normal rate.   Respiratory: Effort normal.  GI: Soft.  Musculoskeletal:  She is tender to palpation over left shoulder, subacromial space and deltoid. Forward flexion within 5% of normal range of motion of contralateral side, but it is painful. Decreased internal rotation, severely decreased, painful. Mildly decreased abduction. Strength is 4+/5 with external rotation against resistance; otherwise 5/5, but it is painful with abduction testing. Positive impingement and secondary impingement. No  instability.   Neurological: She is alert and oriented to person, place, and time.    X-rays were reviewed by Dr. Tonita Cong. Type II acromion. MRI of left shoulder, images and report reviewed by Dr. Tonita Cong. There is a high grade partial tear at the posterior supraspinatus insertion. It does actually appear to be full thickness at this posterior aspect upon review of the images and per the report, it says that there is a small focus of full thickness extension. Mild AC joint hypertrophic changes. No significant undersurface spurring noted. No muscle atrophy noted. Small joint effusion, mild bursitis.  Assessment/Plan L shoulder RCT  Seven and a half months of left shoulder pain without injury, ongoing impingement symptoms; partial to full thickness high grade rotator cuff tear, supraspinatus tendon.  We discussed treatment options with the patient. Given the duration of her symptoms and the MRI findings of a partial to full thickness rotator cuff tear in the supraspinatus tendon, do recommend proceeding with rotator cuff repair. We discussed the possibility of this tear to propagate if she does delay the surgery, although her time constraints with her sister are understandable. We would not recommend waiting past three months for repair at this point. We discussed the procedure itself, as well as risks, complications and alternatives including, but not limited to DVT, PE, infection, bleeding, failure of procedure, need for secondary procedure, anesthesia risk, even death. Discussed postop protocol, time in a sling, need for physical therapy, passive range of motion exercises. She will need clearance from her cardiologist. She is on Plavix, she does have a history of an MI. She also has RA and is on methotrexate for that, which she will need to hold perioperatively as well. In the interim, we discussed continued activity modifications to avoid exacerbation or further tearing of the tendon. She will continue with  Tylenol as needed for pain, continue with home exercises as tolerated. I did give her a handout that discusses rotator cuff tears and surgery, does have illustrations in there as well. She is given a clearance letter for her cardiologist. She actually sees the PA Glean Hess at the cardiologist office. She will be dropping that off today and they can fax it back to [place]. We will proceed accordingly once we receive that with scheduling surgery left shoulder, mini open rotator cuff repair and subacromial decompression. She will follow up 10 to 14 days postop for suture removal and will call with any questions or concerns in the interim.  Plan L shoulder mini-open RCR/SAD  Evelen Vazguez M. PA-C for Dr. Tonita Cong 11/07/2014, 9:06 AM

## 2014-11-08 ENCOUNTER — Other Ambulatory Visit: Payer: Self-pay | Admitting: *Deleted

## 2014-11-08 ENCOUNTER — Telehealth: Payer: Self-pay | Admitting: Nurse Practitioner

## 2014-11-08 MED ORDER — ATENOLOL 25 MG PO TABS: 25.0000 mg | ORAL_TABLET | Freq: Every day | ORAL | Status: DC

## 2014-11-08 NOTE — Telephone Encounter (Signed)
Pt is having surgery on September 29 and wants to know when to start holding plavix and how long.  Will route to [place] to advise.

## 2014-11-08 NOTE — Telephone Encounter (Signed)
S/w pt aware to hold plavix 5 days prior to surgery

## 2014-11-08 NOTE — Telephone Encounter (Signed)
Ok to hold Plavix for 5 days prior to surgery.

## 2014-11-08 NOTE — Telephone Encounter (Signed)
New message  Pt is requesting surgical Clearance and I informed her on the New Policy  That we can no longer accept Surgical Clearance from Patients  And I informed her that the surgeons office will have to call us.  She is upset and wants it to go through her and she will like a call back.

## 2014-11-10 NOTE — Patient Instructions (Addendum)
YOUR PROCEDURE IS SCHEDULED ON :  11/16/14  REPORT TO [place] MAIN ENTRANCE FOLLOW SIGNS TO EAST ELEVATOR - GO TO 3rd FLOOR CHECK IN AT 3 EAST NURSES STATION (SHORT STAY) AT:  5:30 AM  CALL THIS NUMBER IF YOU HAVE PROBLEMS THE MORNING OF SURGERY [phone]  REMEMBER:ONLY 1 PER PERSON MAY GO TO SHORT STAY WITH YOU TO GET READY THE MORNING OF YOUR SURGERY  DO NOT EAT FOOD OR DRINK LIQUIDS AFTER MIDNIGHT  TAKE THESE MEDICINES THE MORNING OF SURGERY: CARTIA / ESCITALOPRAM /OMEPRAZOLE   BRING C-PAP TUBING AND MASK TO HOSPITAL  YOU MAY NOT HAVE ANY METAL ON YOUR BODY INCLUDING HAIR PINS AND PIERCING'S. DO NOT WEAR JEWELRY, MAKEUP, LOTIONS, POWDERS OR PERFUMES. DO NOT WEAR NAIL POLISH. DO NOT SHAVE 48 HRS PRIOR TO SURGERY. MEN MAY SHAVE FACE AND NECK.  DO NOT [place]. [facility] IS NOT RESPONSIBLE FOR VALUABLES.  CONTACTS, DENTURES OR PARTIALS MAY NOT BE WORN TO SURGERY. LEAVE SUITCASE IN CAR. CAN BE BROUGHT TO ROOM AFTER SURGERY.  PATIENTS DISCHARGED THE DAY OF SURGERY WILL NOT BE ALLOWED TO DRIVE HOME.  PLEASE READ OVER THE FOLLOWING INSTRUCTION SHEETS _________________________________________________________________________________                                          [facility] - PREPARING FOR SURGERY  Before surgery, you can play an important role.  Because skin is not sterile, your skin needs to be as free of germs as possible.  You can reduce the number of germs on your skin by washing with CHG (chlorahexidine gluconate) soap before surgery.  CHG is an antiseptic cleaner which kills germs and bonds with the skin to continue killing germs even after washing. Please DO NOT use if you have an allergy to CHG or antibacterial soaps.  If your skin becomes reddened/irritated stop using the CHG and inform your nurse when you arrive at Short Stay. Do not shave (including legs and underarms) for at least 48 hours prior to the first CHG shower.   You may shave your face. Please follow these instructions carefully:   1.  Shower with CHG Soap the night before surgery and the  morning of Surgery.   2.  If you choose to wash your hair, wash your hair first as usual with your  normal  Shampoo.   3.  After you shampoo, rinse your hair and body thoroughly to remove the  shampoo.                                         4.  Use CHG as you would any other liquid soap.  You can apply chg directly  to the skin and wash . Gently wash with scrungie or clean wascloth    5.  Apply the CHG Soap to your body ONLY FROM THE NECK DOWN.   Do not use on open                           Wound or open sores. Avoid contact with eyes, ears mouth and genitals (private parts).  Genitals (private parts) with your normal soap.              6.  Wash thoroughly, paying special attention to the area where your surgery  will be performed.   7.  Thoroughly rinse your body with warm water from the neck down.   8.  DO NOT shower/wash with your normal soap after using and rinsing off  the CHG Soap .                9.  Pat yourself dry with a clean towel.             10.  Wear clean night clothes to bed after shower             11.  Place clean sheets on your bed the night of your first shower and do not  sleep with pets.  Day of Surgery : Do not apply any lotions/deodorants the morning of surgery.  Please wear clean clothes to the hospital/surgery center.  FAILURE TO FOLLOW THESE INSTRUCTIONS MAY RESULT IN THE CANCELLATION OF YOUR SURGERY    PATIENT SIGNATURE_________________________________  ______________________________________________________________________     Adam Phenix  An incentive spirometer is a tool that can help keep your lungs clear and active. This tool measures how well you are filling your lungs with each breath. Taking long deep breaths may help reverse or decrease the chance of developing breathing  (pulmonary) problems (especially infection) following:  A long period of time when you are unable to move or be active. BEFORE THE PROCEDURE   If the spirometer includes an indicator to show your best effort, your nurse or respiratory therapist will set it to a desired goal.  If possible, sit up straight or lean slightly forward. Try not to slouch.  Hold the incentive spirometer in an upright position. INSTRUCTIONS FOR USE  1. Sit on the edge of your bed if possible, or sit up as far as you can in bed or on a chair. 2. Hold the incentive spirometer in an upright position. 3. Breathe out normally. 4. Place the mouthpiece in your mouth and seal your lips tightly around it. 5. Breathe in slowly and as deeply as possible, raising the piston or the ball toward the top of the column. 6. Hold your breath for 3-5 seconds or for as long as possible. Allow the piston or ball to fall to the bottom of the column. 7. Remove the mouthpiece from your mouth and breathe out normally. 8. Rest for a few seconds and repeat Steps 1 through 7 at least 10 times every 1-2 hours when you are awake. Take your time and take a few normal breaths between deep breaths. 9. The spirometer may include an indicator to show your best effort. Use the indicator as a goal to work toward during each repetition. 10. After each set of 10 deep breaths, practice coughing to be sure your lungs are clear. If you have an incision (the cut made at the time of surgery), support your incision when coughing by placing a pillow or rolled up towels firmly against it. Once you are able to get out of bed, walk around indoors and cough well. You may stop using the incentive spirometer when instructed by your caregiver.  RISKS AND COMPLICATIONS  Take your time so you do not get dizzy or light-headed.  If you are in pain, you may need to take or ask for pain medication before doing incentive spirometry. It is  harder to take a deep breath if you  are having pain. AFTER USE  Rest and breathe slowly and easily.  It can be helpful to keep track of a log of your progress. Your caregiver can provide you with a simple table to help with this. If you are using the spirometer at home, follow these instructions: [place] IF:   You are having difficultly using the spirometer.  You have trouble using the spirometer as often as instructed.  Your pain medication is not giving enough relief while using the spirometer.  You develop fever of 100.5 F (38.1 C) or higher. SEEK IMMEDIATE MEDICAL CARE IF:   You cough up bloody sputum that had not been present before.  You develop fever of 102 F (38.9 C) or greater.  You develop worsening pain at or near the incision site. MAKE SURE YOU:   Understand these instructions.  Will watch your condition.  Will get help right away if you are not doing well or get worse. Document Released: 06/16/2006 Document Revised: 04/28/2011 Document Reviewed: 08/17/2006 [facility] Patient Information 2014 [place], [place].   ________________________________________________________________________

## 2014-11-13 ENCOUNTER — Ambulatory Visit
Admission: RE | Admit: 2014-11-13 | Discharge: 2014-11-13 | Disposition: A | Discharge status: Home / Self Care | Payer: Managed Care, Other (non HMO) | Source: Ambulatory Visit

## 2014-11-13 ENCOUNTER — Other Ambulatory Visit: Payer: Self-pay

## 2014-11-13 DIAGNOSIS — Z1231 Encounter for screening mammogram for malignant neoplasm of breast: Secondary | ICD-10-CM

## 2014-11-14 ENCOUNTER — Encounter (HOSPITAL_COMMUNITY)
Admission: RE | Admit: 2014-11-14 | Discharge: 2014-11-14 | Disposition: A | Discharge status: Home / Self Care | Payer: Managed Care, Other (non HMO) | Source: Ambulatory Visit | Attending: Specialist | Admitting: Specialist

## 2014-11-14 ENCOUNTER — Encounter (INDEPENDENT_AMBULATORY_CARE_PROVIDER_SITE_OTHER): Payer: Self-pay

## 2014-11-14 ENCOUNTER — Encounter (HOSPITAL_COMMUNITY): Payer: Self-pay

## 2014-11-14 DIAGNOSIS — M8580 Other specified disorders of bone density and structure, unspecified site: Secondary | ICD-10-CM | POA: Diagnosis not present

## 2014-11-14 DIAGNOSIS — M7542 Impingement syndrome of left shoulder: Secondary | ICD-10-CM | POA: Diagnosis not present

## 2014-11-14 DIAGNOSIS — M75122 Complete rotator cuff tear or rupture of left shoulder, not specified as traumatic: Secondary | ICD-10-CM | POA: Diagnosis present

## 2014-11-14 DIAGNOSIS — Z87891 Personal history of nicotine dependence: Secondary | ICD-10-CM | POA: Diagnosis not present

## 2014-11-14 DIAGNOSIS — E785 Hyperlipidemia, unspecified: Secondary | ICD-10-CM | POA: Diagnosis not present

## 2014-11-14 DIAGNOSIS — I739 Peripheral vascular disease, unspecified: Secondary | ICD-10-CM | POA: Diagnosis not present

## 2014-11-14 DIAGNOSIS — Z91018 Allergy to other foods: Secondary | ICD-10-CM | POA: Diagnosis not present

## 2014-11-14 DIAGNOSIS — I252 Old myocardial infarction: Secondary | ICD-10-CM | POA: Diagnosis not present

## 2014-11-14 DIAGNOSIS — G4730 Sleep apnea, unspecified: Secondary | ICD-10-CM | POA: Diagnosis not present

## 2014-11-14 DIAGNOSIS — M1990 Unspecified osteoarthritis, unspecified site: Secondary | ICD-10-CM | POA: Diagnosis not present

## 2014-11-14 DIAGNOSIS — I10 Essential (primary) hypertension: Secondary | ICD-10-CM | POA: Diagnosis not present

## 2014-11-14 DIAGNOSIS — K219 Gastro-esophageal reflux disease without esophagitis: Secondary | ICD-10-CM | POA: Diagnosis not present

## 2014-11-14 DIAGNOSIS — Z888 Allergy status to other drugs, medicaments and biological substances status: Secondary | ICD-10-CM | POA: Diagnosis not present

## 2014-11-14 DIAGNOSIS — I2510 Atherosclerotic heart disease of native coronary artery without angina pectoris: Secondary | ICD-10-CM | POA: Diagnosis not present

## 2014-11-14 HISTORY — DX: Family history of other specified conditions: Z84.89

## 2014-11-14 HISTORY — DX: Headache: R51

## 2014-11-14 HISTORY — DX: Gastro-esophageal reflux disease without esophagitis: K21.9

## 2014-11-14 HISTORY — DX: Sleep apnea, unspecified: G47.30

## 2014-11-14 HISTORY — DX: Unspecified rotator cuff tear or rupture of unspecified shoulder, not specified as traumatic: M75.100

## 2014-11-14 HISTORY — DX: Major depressive disorder, single episode, unspecified: F32.9

## 2014-11-14 HISTORY — DX: Personal history of peptic ulcer disease: Z87.11

## 2014-11-14 HISTORY — DX: Depression, unspecified: F32.A

## 2014-11-14 HISTORY — DX: Unspecified osteoarthritis, unspecified site: M19.90

## 2014-11-14 HISTORY — DX: Other allergy status, other than to drugs and biological substances: Z91.09

## 2014-11-14 HISTORY — DX: Personal history of other diseases of the digestive system: Z87.19

## 2014-11-14 LAB — CBC
HCT: 43.6 % (ref 36.0–46.0)
HEMOGLOBIN: 14.1 g/dL (ref 12.0–15.0)
MCH: 31.1 pg (ref 26.0–34.0)
MCHC: 32.3 g/dL (ref 30.0–36.0)
MCV: 96 fL (ref 78.0–100.0)
PLATELETS: 268 10*3/uL (ref 150–400)
RBC: 4.54 MIL/uL (ref 3.87–5.11)
RDW: 13.3 % (ref 11.5–15.5)
WBC: 7.3 10*3/uL (ref 4.0–10.5)

## 2014-11-14 LAB — BASIC METABOLIC PANEL
ANION GAP: 7 (ref 5–15)
BUN: 20 mg/dL (ref 6–20)
CHLORIDE: 105 mmol/L (ref 101–111)
CO2: 27 mmol/L (ref 22–32)
Calcium: 9.8 mg/dL (ref 8.9–10.3)
Creatinine, Ser: 0.89 mg/dL (ref 0.44–1.00)
Glucose, Bld: 86 mg/dL (ref 65–99)
Potassium: 4.1 mmol/L (ref 3.5–5.1)
SODIUM: 139 mmol/L (ref 135–145)

## 2014-11-15 NOTE — Anesthesia Preprocedure Evaluation (Addendum)
Anesthesia Evaluation  Patient identified by MRN, date of birth, ID band Patient awake    Reviewed: Allergy & Precautions, NPO status , Patient's Chart, lab work & pertinent test results, reviewed documented beta blocker date and time   History of Anesthesia Complications (+) Family history of anesthesia reaction  Airway Mallampati: III  TM Distance: >3 FB Neck ROM: Full    Dental  (+) Teeth Intact   Pulmonary sleep apnea , former smoker,    breath sounds clear to auscultation       Cardiovascular hypertension, Pt. on medications and Pt. on home beta blockers + CAD, + Past MI and + Cardiac Stents   Rhythm:Regular Rate:Normal     Neuro/Psych  Headaches, PSYCHIATRIC DISORDERS Depression    GI/Hepatic Neg liver ROS, GERD  Medicated,  Endo/Other  negative endocrine ROS  Renal/GU negative Renal ROS  negative genitourinary   Musculoskeletal  (+) Arthritis , Osteoarthritis,    Abdominal   Peds negative pediatric ROS (+)  Hematology negative hematology ROS (+)   Anesthesia Other Findings   Reproductive/Obstetrics                            Lab Results  Component Value Date   WBC 7.3 11/14/2014   HGB 14.1 11/14/2014   HCT 43.6 11/14/2014   MCV 96.0 11/14/2014   PLT 268 11/14/2014   Lab Results  Component Value Date   CREATININE 0.89 11/14/2014   BUN 20 11/14/2014   NA 139 11/14/2014   K 4.1 11/14/2014   CL 105 11/14/2014   CO2 27 11/14/2014   Lab Results  Component Value Date   INR 0.90 09/02/2013   EKG: sinus bradycardia.  Myocardial Perfusion Test:  Nuclear stress EF: 47%.  The left ventricular ejection fraction is mildly decreased (45-54%).  There was no ST segment deviation noted during stress.  This is a low risk study.   Anesthesia Physical Anesthesia Plan  ASA: III  Anesthesia Plan: General   Post-op Pain Management: GA combined w/ Regional for post-op pain    Induction: Intravenous  Airway Management Planned: Oral ETT  Additional Equipment:   Intra-op Plan:   Post-operative Plan: Extubation in OR  Informed Consent: I have reviewed the patients History and Physical, chart, labs and discussed the procedure including the risks, benefits and alternatives for the proposed anesthesia with the patient or authorized representative who has indicated his/her understanding and acceptance.   Dental advisory given  Plan Discussed with: CRNA  Anesthesia Plan Comments: (Anesthetic plan discussed in detail. Associated risk discussed including but not limited to life threatening cardiovascular, pulmonary events and dental damage. The postoperative pain management and antiemetic plan discussed with patient. All questions answered in detail. Patient is in agreement.    Will not proceed with IS block due to patient reservation and pulmonary status secondary to smoking. )       Anesthesia Quick Evaluation

## 2014-11-16 ENCOUNTER — Ambulatory Visit (HOSPITAL_COMMUNITY)
Admission: RE | Admit: 2014-11-16 | Discharge: 2014-11-16 | Disposition: A | Discharge status: Home / Self Care | Payer: Managed Care, Other (non HMO) | Source: Ambulatory Visit | Attending: Specialist | Admitting: Specialist

## 2014-11-16 ENCOUNTER — Ambulatory Visit (HOSPITAL_COMMUNITY): Payer: Managed Care, Other (non HMO) | Admitting: Anesthesiology

## 2014-11-16 ENCOUNTER — Encounter (HOSPITAL_COMMUNITY)
Admission: RE | Disposition: A | Discharge status: Home / Self Care | Payer: Self-pay | Source: Ambulatory Visit | Attending: Specialist

## 2014-11-16 ENCOUNTER — Encounter (HOSPITAL_COMMUNITY): Payer: Self-pay | Admitting: *Deleted

## 2014-11-16 DIAGNOSIS — M7542 Impingement syndrome of left shoulder: Secondary | ICD-10-CM | POA: Insufficient documentation

## 2014-11-16 DIAGNOSIS — Z888 Allergy status to other drugs, medicaments and biological substances status: Secondary | ICD-10-CM | POA: Insufficient documentation

## 2014-11-16 DIAGNOSIS — Z87891 Personal history of nicotine dependence: Secondary | ICD-10-CM | POA: Insufficient documentation

## 2014-11-16 DIAGNOSIS — G4730 Sleep apnea, unspecified: Secondary | ICD-10-CM | POA: Insufficient documentation

## 2014-11-16 DIAGNOSIS — M1990 Unspecified osteoarthritis, unspecified site: Secondary | ICD-10-CM | POA: Insufficient documentation

## 2014-11-16 DIAGNOSIS — I252 Old myocardial infarction: Secondary | ICD-10-CM | POA: Insufficient documentation

## 2014-11-16 DIAGNOSIS — E785 Hyperlipidemia, unspecified: Secondary | ICD-10-CM | POA: Insufficient documentation

## 2014-11-16 DIAGNOSIS — I10 Essential (primary) hypertension: Secondary | ICD-10-CM | POA: Insufficient documentation

## 2014-11-16 DIAGNOSIS — I2510 Atherosclerotic heart disease of native coronary artery without angina pectoris: Secondary | ICD-10-CM | POA: Insufficient documentation

## 2014-11-16 DIAGNOSIS — M8580 Other specified disorders of bone density and structure, unspecified site: Secondary | ICD-10-CM | POA: Insufficient documentation

## 2014-11-16 DIAGNOSIS — M75122 Complete rotator cuff tear or rupture of left shoulder, not specified as traumatic: Secondary | ICD-10-CM | POA: Diagnosis not present

## 2014-11-16 DIAGNOSIS — Z91018 Allergy to other foods: Secondary | ICD-10-CM | POA: Insufficient documentation

## 2014-11-16 DIAGNOSIS — I739 Peripheral vascular disease, unspecified: Secondary | ICD-10-CM | POA: Insufficient documentation

## 2014-11-16 DIAGNOSIS — K219 Gastro-esophageal reflux disease without esophagitis: Secondary | ICD-10-CM | POA: Insufficient documentation

## 2014-11-16 HISTORY — PX: SHOULDER OPEN ROTATOR CUFF REPAIR: SHX2407

## 2014-11-16 SURGERY — REPAIR, ROTATOR CUFF, OPEN
Anesthesia: General | Site: Shoulder | Laterality: Left

## 2014-11-16 MED ORDER — HYDROMORPHONE HCL 1 MG/ML IJ SOLN
0.2500 mg | INTRAMUSCULAR | Status: DC | PRN
  Administered 2014-11-16 (×4): 0.5 mg via INTRAVENOUS

## 2014-11-16 MED ORDER — CEFAZOLIN SODIUM-DEXTROSE 2-3 GM-% IV SOLR
2.0000 g | INTRAVENOUS | Status: AC
  Administered 2014-11-16: 2 g via INTRAVENOUS

## 2014-11-16 MED ORDER — CEFAZOLIN SODIUM-DEXTROSE 2-3 GM-% IV SOLR
INTRAVENOUS | Status: AC
  Filled 2014-11-16: qty 50

## 2014-11-16 MED ORDER — GLYCOPYRROLATE 0.2 MG/ML IJ SOLN
INTRAMUSCULAR | Status: DC | PRN
  Administered 2014-11-16: .5 mg via INTRAVENOUS

## 2014-11-16 MED ORDER — ONDANSETRON HCL 4 MG/2ML IJ SOLN
INTRAMUSCULAR | Status: DC | PRN
  Administered 2014-11-16: 4 mg via INTRAVENOUS

## 2014-11-16 MED ORDER — PROPOFOL 10 MG/ML IV BOLUS
INTRAVENOUS | Status: AC
  Filled 2014-11-16: qty 20

## 2014-11-16 MED ORDER — SUCCINYLCHOLINE CHLORIDE 20 MG/ML IJ SOLN
INTRAMUSCULAR | Status: DC | PRN
  Administered 2014-11-16: 100 mg via INTRAVENOUS

## 2014-11-16 MED ORDER — PROPOFOL 10 MG/ML IV BOLUS
INTRAVENOUS | Status: DC | PRN
  Administered 2014-11-16: 100 mg via INTRAVENOUS
  Administered 2014-11-16: 40 mg via INTRAVENOUS

## 2014-11-16 MED ORDER — HYDROMORPHONE HCL 1 MG/ML IJ SOLN
INTRAMUSCULAR | Status: AC
  Filled 2014-11-16: qty 1

## 2014-11-16 MED ORDER — EPHEDRINE SULFATE 50 MG/ML IJ SOLN
INTRAMUSCULAR | Status: DC | PRN
  Administered 2014-11-16 (×2): 10 mg via INTRAVENOUS

## 2014-11-16 MED ORDER — POLYMYXIN B SULFATE 500000 UNITS IJ SOLR
INTRAMUSCULAR | Status: DC | PRN
  Administered 2014-11-16: 500 mL

## 2014-11-16 MED ORDER — BUPIVACAINE-EPINEPHRINE (PF) 0.5% -1:200000 IJ SOLN
INTRAMUSCULAR | Status: AC
  Filled 2014-11-16: qty 30

## 2014-11-16 MED ORDER — ACETAMINOPHEN 10 MG/ML IV SOLN
1000.0000 mg | Freq: Once | INTRAVENOUS | Status: AC
  Administered 2014-11-16: 1000 mg via INTRAVENOUS

## 2014-11-16 MED ORDER — DEXAMETHASONE SODIUM PHOSPHATE 10 MG/ML IJ SOLN
INTRAMUSCULAR | Status: AC
  Filled 2014-11-16: qty 1

## 2014-11-16 MED ORDER — LIDOCAINE HCL (CARDIAC) 20 MG/ML IV SOLN
INTRAVENOUS | Status: AC
  Filled 2014-11-16: qty 5

## 2014-11-16 MED ORDER — NEOSTIGMINE METHYLSULFATE 10 MG/10ML IV SOLN
INTRAVENOUS | Status: DC | PRN
  Administered 2014-11-16: 3 mg via INTRAVENOUS

## 2014-11-16 MED ORDER — OXYCODONE-ACETAMINOPHEN 5-325 MG PO TABS
1.0000 | ORAL_TABLET | ORAL | Status: DC | PRN
Start: 2014-11-16 — End: 2014-11-16
  Administered 2014-11-16: 1 via ORAL
  Filled 2014-11-16: qty 1

## 2014-11-16 MED ORDER — PROMETHAZINE HCL 25 MG/ML IJ SOLN: 6.2500 mg | INTRAMUSCULAR | Status: DC | PRN

## 2014-11-16 MED ORDER — ROCURONIUM BROMIDE 100 MG/10ML IV SOLN
INTRAVENOUS | Status: DC | PRN
  Administered 2014-11-16: 10 mg via INTRAVENOUS

## 2014-11-16 MED ORDER — FENTANYL CITRATE (PF) 250 MCG/5ML IJ SOLN
INTRAMUSCULAR | Status: AC
  Filled 2014-11-16: qty 25

## 2014-11-16 MED ORDER — SODIUM CHLORIDE 0.9 % IR SOLN
Status: AC
  Filled 2014-11-16: qty 1

## 2014-11-16 MED ORDER — KETOROLAC TROMETHAMINE 10 MG PO TABS: 10.0000 mg | ORAL_TABLET | Freq: Four times a day (QID) | ORAL | Status: DC | PRN

## 2014-11-16 MED ORDER — ROCURONIUM BROMIDE 100 MG/10ML IV SOLN
INTRAVENOUS | Status: AC
  Filled 2014-11-16: qty 1

## 2014-11-16 MED ORDER — METHOCARBAMOL 500 MG PO TABS: 500.0000 mg | ORAL_TABLET | Freq: Three times a day (TID) | ORAL | Status: DC | PRN

## 2014-11-16 MED ORDER — ONDANSETRON HCL 4 MG/2ML IJ SOLN
INTRAMUSCULAR | Status: AC
  Filled 2014-11-16: qty 2

## 2014-11-16 MED ORDER — LACTATED RINGERS IV SOLN: INTRAVENOUS | Status: DC

## 2014-11-16 MED ORDER — CEFAZOLIN SODIUM 1-5 GM-% IV SOLN
1.0000 g | Freq: Once | INTRAVENOUS | Status: AC
  Administered 2014-11-16: 1 g via INTRAVENOUS
  Filled 2014-11-16 (×2): qty 50

## 2014-11-16 MED ORDER — CEFAZOLIN SODIUM 1-5 GM-% IV SOLN
1.0000 g | Freq: Once | INTRAVENOUS | Status: DC
  Filled 2014-11-16: qty 50

## 2014-11-16 MED ORDER — BUPIVACAINE-EPINEPHRINE 0.5% -1:200000 IJ SOLN
INTRAMUSCULAR | Status: DC | PRN
  Administered 2014-11-16: 20 mL

## 2014-11-16 MED ORDER — ACETAMINOPHEN 10 MG/ML IV SOLN
INTRAVENOUS | Status: AC
  Filled 2014-11-16: qty 100

## 2014-11-16 MED ORDER — MEPERIDINE HCL 50 MG/ML IJ SOLN: 6.2500 mg | INTRAMUSCULAR | Status: DC | PRN

## 2014-11-16 MED ORDER — DOCUSATE SODIUM 100 MG PO CAPS: 100.0000 mg | ORAL_CAPSULE | Freq: Two times a day (BID) | ORAL | Status: DC | PRN

## 2014-11-16 MED ORDER — METHOCARBAMOL 500 MG PO TABS
500.0000 mg | ORAL_TABLET | Freq: Three times a day (TID) | ORAL | Status: DC | PRN
  Administered 2014-11-16: 500 mg via ORAL
  Filled 2014-11-16: qty 1

## 2014-11-16 MED ORDER — CEPHALEXIN 500 MG PO CAPS: 500.0000 mg | ORAL_CAPSULE | Freq: Four times a day (QID) | ORAL | Status: DC

## 2014-11-16 MED ORDER — OXYCODONE-ACETAMINOPHEN 5-325 MG PO TABS: 1.0000 | ORAL_TABLET | ORAL | Status: DC | PRN

## 2014-11-16 MED ORDER — DEXAMETHASONE SODIUM PHOSPHATE 10 MG/ML IJ SOLN
INTRAMUSCULAR | Status: DC | PRN
  Administered 2014-11-16: 10 mg via INTRAVENOUS

## 2014-11-16 MED ORDER — LACTATED RINGERS IV SOLN
INTRAVENOUS | Status: DC
  Administered 2014-11-16: 07:00:00 via INTRAVENOUS

## 2014-11-16 MED ORDER — FENTANYL CITRATE (PF) 250 MCG/5ML IJ SOLN
INTRAMUSCULAR | Status: DC | PRN
  Administered 2014-11-16 (×2): 50 ug via INTRAVENOUS

## 2014-11-16 MED ORDER — LIDOCAINE HCL (CARDIAC) 20 MG/ML IV SOLN
INTRAVENOUS | Status: DC | PRN
  Administered 2014-11-16: 60 mg via INTRAVENOUS

## 2014-11-16 MED ORDER — MIDAZOLAM HCL 2 MG/2ML IJ SOLN
INTRAMUSCULAR | Status: AC
  Filled 2014-11-16: qty 4

## 2014-11-16 SURGICAL SUPPLY — 53 items
ANCH SUT SWLK 19.1X4.75 VT (Anchor) ×2 IMPLANT
ANCHOR NDL 9/16 CIR SZ 8 (NEEDLE) IMPLANT
ANCHOR NEEDLE 9/16 CIR SZ 8 (NEEDLE) ×3 IMPLANT
ANCHOR PEEK 4.75X19.1 SWLK C (Anchor) ×4 IMPLANT
BAG SPEC THK2 15X12 ZIP CLS (MISCELLANEOUS)
BAG ZIPLOCK 12X15 (MISCELLANEOUS) IMPLANT
BLADE OSCILLATING/SAGITTAL (BLADE)
BLADE SW THK.38XMED LNG THN (BLADE) IMPLANT
CLOSURE WOUND 1/2 X4 (GAUZE/BANDAGES/DRESSINGS) ×1
CLOTH 2% CHLOROHEXIDINE 3PK (PERSONAL CARE ITEMS) ×3 IMPLANT
DRAPE POUCH INSTRU U-SHP 10X18 (DRAPES) ×3 IMPLANT
DRSG AQUACEL AG ADV 3.5X 4 (GAUZE/BANDAGES/DRESSINGS) ×2 IMPLANT
DRSG AQUACEL AG ADV 3.5X 6 (GAUZE/BANDAGES/DRESSINGS) IMPLANT
DURAPREP 26ML APPLICATOR (WOUND CARE) ×3 IMPLANT
ELECT NDL TIP 2.8 STRL (NEEDLE) ×1 IMPLANT
ELECT NEEDLE TIP 2.8 STRL (NEEDLE) IMPLANT
ELECT REM PT RETURN 9FT ADLT (ELECTROSURGICAL) ×3
ELECTRODE REM PT RTRN 9FT ADLT (ELECTROSURGICAL) ×1 IMPLANT
GLOVE BIOGEL PI IND STRL 7.0 (GLOVE) ×1 IMPLANT
GLOVE BIOGEL PI IND STRL 8 (GLOVE) ×1 IMPLANT
GLOVE BIOGEL PI INDICATOR 7.0 (GLOVE) ×2
GLOVE BIOGEL PI INDICATOR 8 (GLOVE)
GLOVE SURG SS PI 7.0 STRL IVOR (GLOVE) ×3 IMPLANT
GLOVE SURG SS PI 7.5 STRL IVOR (GLOVE) ×1 IMPLANT
GLOVE SURG SS PI 8.0 STRL IVOR (GLOVE) ×3 IMPLANT
GOWN STRL REUS W/TWL XL LVL3 (GOWN DISPOSABLE) ×6 IMPLANT
KIT BASIN OR (CUSTOM PROCEDURE TRAY) ×3 IMPLANT
KIT POSITION SHOULDER SCHLEI (MISCELLANEOUS) ×3 IMPLANT
MANIFOLD NEPTUNE II (INSTRUMENTS) ×3 IMPLANT
NDL SCORPION (NEEDLE) IMPLANT
NDL SCORPION MULTI FIRE (NEEDLE) IMPLANT
NEEDLE SCORPION (NEEDLE) ×3 IMPLANT
NEEDLE SCORPION MULTI FIRE (NEEDLE) ×3 IMPLANT
PACK SHOULDER (CUSTOM PROCEDURE TRAY) ×3 IMPLANT
PEN SKIN MARKING BROAD (MISCELLANEOUS) ×3 IMPLANT
POSITIONER SURGICAL ARM (MISCELLANEOUS) ×3 IMPLANT
SLING ARM IMMOBILIZER LRG (SOFTGOODS) IMPLANT
SLING ARM MED ADULT FOAM STRAP (SOFTGOODS) ×2 IMPLANT
SLING ULTRA II L (ORTHOPEDIC SUPPLIES) IMPLANT
STRIP CLOSURE SKIN 1/2X4 (GAUZE/BANDAGES/DRESSINGS) ×2 IMPLANT
SUT BONE WAX W31G (SUTURE) IMPLANT
SUT ETHIBOND NAB CT1 #1 30IN (SUTURE) IMPLANT
SUT FIBERWIRE #2 38 T-5 BLUE (SUTURE)
SUT PROLENE 3 0 PS 2 (SUTURE) ×3 IMPLANT
SUT TIGER TAPE 7 IN WHITE (SUTURE) ×2 IMPLANT
SUT VIC AB 1-0 CT2 27 (SUTURE) ×3 IMPLANT
SUT VIC AB 2-0 CT2 27 (SUTURE) ×3 IMPLANT
SUT VICRYL 0-0 OS 2 NEEDLE (SUTURE) IMPLANT
SUTURE FIBERWR #2 38 T-5 BLUE (SUTURE) IMPLANT
TAPE FIBER 2MM 7IN #2 BLUE (SUTURE) IMPLANT
TOWEL OR 17X26 10 PK STRL BLUE (TOWEL DISPOSABLE) ×3 IMPLANT
TOWEL OR NON WOVEN STRL DISP B (DISPOSABLE) ×3 IMPLANT
YANKAUER SUCT BULB TIP NO VENT (SUCTIONS) ×3 IMPLANT

## 2014-11-16 NOTE — Interval H&P Note (Signed)
History and Physical Interval Note:  11/16/2014 7:27 AM  Donna Bullock  has presented today for surgery, with the diagnosis of LEFT SHOULDER ROTATOR CUFF TEAR  The various methods of treatment have been discussed with the patient and family. After consideration of risks, benefits and other options for treatment, the patient has consented to  Procedure(s): LEFT MINI OPEN ROTATOR CUFF REPAIR SHOULDER OPEN WITH SUBACROMIAL DECOMPRESSION (Left) as a surgical intervention .  The patient's history has been reviewed, patient examined, no change in status, stable for surgery.  I have reviewed the patient's chart and labs.  Questions were answered to the patient's satisfaction.     Abad Manard C

## 2014-11-16 NOTE — Progress Notes (Signed)
Patient received from PACU after left shoulder surgery. Left arm sling and left elbow up on pillow. Ice bag to left shoulder. To stay until 11 AM for IV antibiotic.

## 2014-11-16 NOTE — Transfer of Care (Signed)
Immediate Anesthesia Transfer of Care Note  Patient: Donna Bullock  Procedure(s) Performed: Procedure(s): LEFT MINI OPEN ROTATOR CUFF REPAIR SHOULDER OPEN WITH SUBACROMIAL DECOMPRESSION (Left)  Patient Location: PACU  Anesthesia Type:General  Level of Consciousness: awake, alert , oriented and patient cooperative  Airway & Oxygen Therapy: Patient Spontanous Breathing and Patient connected to face mask oxygen  Post-op Assessment: Report given to RN and Post -op Vital signs reviewed and stable  Post vital signs: Reviewed and stable  Last Vitals:  Filed Vitals:   11/16/14 0519  BP: 126/83  Pulse: 59  Temp: 36.3 C  Resp: 18    Complications: No apparent anesthesia complications

## 2014-11-16 NOTE — Op Note (Signed)
NAMEMAKAI, AGOSTINELLI NO.:  [id number]  MEDICAL RECORD NO.:  [id number]  LOCATION:  [facility]                         FACILITY:  [facility]  PHYSICIAN:  Susa Day, M.D.    DATE OF BIRTH:  1956/01/05  DATE OF PROCEDURE:  11/16/2014 DATE OF DISCHARGE:                              OPERATIVE REPORT   PREOPERATIVE DIAGNOSIS:  Full-thickness tear, rotator cuff, left.  POSTOPERATIVE DIAGNOSIS:  Full-thickness tear, rotator cuff, left.  PROCEDURES PERFORMED: 1. Mini open rotator cuff repair, left shoulder. 2. Subacromial decompression bursectomy.  ANESTHESIA:  General.  SURGEON:  Susa Day, M.D.  ASSISTANT:  Cleophas Dunker, PA.  HISTORY:  A 59 year old with a full-thickness tear, retracted rotator cuff, indicated for repair.  Risks and benefits were discussed including bleeding, infection, suboptimal range of motion, DVT, PE, anesthetic complications, etc.  TECHNIQUE:  With the patient in supine beach-chair position, after induction of adequate general anesthesia, 2 g Kefzol, the shoulder was prepped and draped in the usual sterile fashion.  Surgical marker utilized on the acromion, AC joint coracoid.  A small 2-cm incision was made over the anterolateral aspect of the acromion.  Subcutaneous tissue was dissected.  Electrocautery was utilized to achieve hemostasis. Raphe between the anterolateral heads was identified, divided in line with skin incision.  Self-retaining Charnley retractor was placed.  A 3 mm Kerrison utilized, performed removal of a small spur off the anterolateral aspect of the acromion.  We partially released the CA ligament with an AO elevator.  I digitally lysed adhesions.  Full bursectomy was performed.  Full-thickness tear of the rotator cuff, longitudinal was noted and insertion of the supraspinatus, we debrided that and fashioned the trough, distal lateral aspect of the greater tuberosity.  I placed a PushLock anchor in this position  utilizing an awl, insertion of the anchor with excellent resistance to pull out. Suture was then passed through the tendon over the top of the tendon.  A good surgical knot applied to that and then the knot was further secured in a double row fashion with a second PushLock over the greater tuberosity.  Utilizing an awl, insertion of the PushLock with appropriate tension.  Excellent repair was noted.  Redundant suture removed.  Copiously irrigated the wound.  Remainder of the cuff was unremarkable.  I then repaired the raphe with 1 Vicryl, subcu with 2-0, and skin with subcuticular Prolene.  Sterile dressing applied.  The patient was placed in a sling, extubated without difficulty, and transferred.  Prior to that, a 25% Marcaine with epinephrine was infiltrated in the joint.  She was extubated without difficulty, and transported to the recovery room in satisfactory condition.  The patient tolerated the procedure well. No complications.  Minimal blood loss.     Susa Day, M.D.     Geralynn Rile  D:  11/16/2014  T:  11/16/2014  Job:  [id number]

## 2014-11-16 NOTE — H&P (View-Only) (Signed)
Donna Bullock is an 59 y.o. female.   Chief Complaint: L shoulder pain HPI: The patient is a 59 year old female who presents today for follow up of their shoulder. The patient is being followed for their left shoulder pain and impingement. They are 7 1/2 months out from when symptoms began. Symptoms reported today include: pain. Current treatment includes: activity modification. The following medication has been used for pain control: none. The patient presents today following MRI. The patient has not gotten any relief of their symptoms with Cortisone injections.  Donna Bullock follows up to review MRI of left shoulder. She is here with her husband, Donna Bullock, also our patient. She reports ongoing symptoms of left shoulder. It has been about seven and a half months now. No specific injury. She had no relief with subacromial steroid injection that I have done over a month ago. She has been in [place] a lot recently. Her sister just got diagnosed with an acute form of B-cell leukemia and so she has been back and forth. Her sister is getting radiation now, she will be in the hospital for several weeks. Her sister does also have her partner up there as a caregiver as well, but Jonell is worried as far as any recommendations for surgery timing and all of that with her sister.   Past Medical History  Diagnosis Date  . Hyperlipidemia   . HTN (hypertension)   . CAD (coronary artery disease)     Inferior MI 1996 tx with bare metal stent RCA. Repeat  anterior MI 2002 with LAD vasospasm. Repeat inferior MI 2003 with occlusion RCA with overlapping Cypher DES in RCA. Repeat cath 2003 with profound vasospasm LAd and Circumflex. Repeat cath 2007  with patent RCA and no disease in LAD or Circumflex.  . Cervical spine disease   . Adrenal tumor   . Myocardial infarction     times 4  . Benign tumor of adrenal gland   . Fibroid   . Osteopenia     Past Surgical History  Procedure Laterality Date  .  Abdominal hysterectomy    . Back surgery    . Myomectomy      age 61    Family History  Problem Relation Age of Onset  . Alcohol abuse Mother   . Breast cancer Mother   . Alcohol abuse Father   . Cancer Father     lung  . Breast cancer Maternal Aunt   . Breast cancer Maternal Grandmother   . Breast cancer Maternal Aunt   . Breast cancer Cousin    Social History:  reports that she has quit smoking. She has never used smokeless tobacco. She reports that she does not drink alcohol or use illicit drugs.  Allergies:  Allergies  Allergen Reactions  . Other Diarrhea    Peppers   . Tomato Diarrhea  . Valium Other (See Comments)    Affects her behavioral     (Not in a hospital admission)  No results found for this or any previous visit (from the past 48 hour(s)). No results found.  Review of Systems  Constitutional: Negative.   HENT: Negative.   Eyes: Negative.   Respiratory: Negative.   Cardiovascular: Negative.   Gastrointestinal: Negative.   Genitourinary: Negative.   Musculoskeletal: Positive for joint pain.  Skin: Negative.   Neurological: Negative.   Psychiatric/Behavioral: Negative.     Last menstrual period 02/18/1995. Physical Exam  Constitutional: She is oriented to person, place, and  time. She appears well-developed and well-nourished.  HENT:  Head: Normocephalic.  Eyes: Pupils are equal, round, and reactive to light.  Neck: Normal range of motion.  Cardiovascular: Normal rate.   Respiratory: Effort normal.  GI: Soft.  Musculoskeletal:  She is tender to palpation over left shoulder, subacromial space and deltoid. Forward flexion within 5% of normal range of motion of contralateral side, but it is painful. Decreased internal rotation, severely decreased, painful. Mildly decreased abduction. Strength is 4+/5 with external rotation against resistance; otherwise 5/5, but it is painful with abduction testing. Positive impingement and secondary impingement. No  instability.   Neurological: She is alert and oriented to person, place, and time.    X-rays were reviewed by Dr. Tonita Cong. Type II acromion. MRI of left shoulder, images and report reviewed by Dr. Tonita Cong. There is a high grade partial tear at the posterior supraspinatus insertion. It does actually appear to be full thickness at this posterior aspect upon review of the images and per the report, it says that there is a small focus of full thickness extension. Mild AC joint hypertrophic changes. No significant undersurface spurring noted. No muscle atrophy noted. Small joint effusion, mild bursitis.  Assessment/Plan L shoulder RCT  Seven and a half months of left shoulder pain without injury, ongoing impingement symptoms; partial to full thickness high grade rotator cuff tear, supraspinatus tendon.  We discussed treatment options with the patient. Given the duration of her symptoms and the MRI findings of a partial to full thickness rotator cuff tear in the supraspinatus tendon, do recommend proceeding with rotator cuff repair. We discussed the possibility of this tear to propagate if she does delay the surgery, although her time constraints with her sister are understandable. We would not recommend waiting past three months for repair at this point. We discussed the procedure itself, as well as risks, complications and alternatives including, but not limited to DVT, PE, infection, bleeding, failure of procedure, need for secondary procedure, anesthesia risk, even death. Discussed postop protocol, time in a sling, need for physical therapy, passive range of motion exercises. She will need clearance from her cardiologist. She is on Plavix, she does have a history of an MI. She also has RA and is on methotrexate for that, which she will need to hold perioperatively as well. In the interim, we discussed continued activity modifications to avoid exacerbation or further tearing of the tendon. She will continue with  Tylenol as needed for pain, continue with home exercises as tolerated. I did give her a handout that discusses rotator cuff tears and surgery, does have illustrations in there as well. She is given a clearance letter for her cardiologist. She actually sees the PA Glean Hess at the cardiologist office. She will be dropping that off today and they can fax it back to [place]. We will proceed accordingly once we receive that with scheduling surgery left shoulder, mini open rotator cuff repair and subacromial decompression. She will follow up 10 to 14 days postop for suture removal and will call with any questions or concerns in the interim.  Plan L shoulder mini-open RCR/SAD  BISSELL, JACLYN M. PA-C for Dr. Tonita Cong 11/07/2014, 9:06 AM

## 2014-11-16 NOTE — Brief Op Note (Signed)
11/16/2014  8:37 AM  PATIENT:  Donna Bullock  59 y.o. female  PRE-OPERATIVE DIAGNOSIS:  LEFT SHOULDER ROTATOR CUFF TEAR  POST-OPERATIVE DIAGNOSIS:  LEFT SHOULDER ROTATOR CUFF TEAR  PROCEDURE:  Procedure(s): LEFT MINI OPEN ROTATOR CUFF REPAIR SHOULDER OPEN WITH SUBACROMIAL DECOMPRESSION (Left)  SURGEON:  Surgeon(s) and Role:    * Susa Day, MD - Primary  PHYSICIAN ASSISTANT:   ASSISTANTS: Bissell   ANESTHESIA:   general  EBL:  Total I/O In: -  Out: 25 [Blood:25]  BLOOD ADMINISTERED:none  DRAINS: none   LOCAL MEDICATIONS USED:  MARCAINE     SPECIMEN:  No Specimen  DISPOSITION OF SPECIMEN:  N/A  COUNTS:  YES  TOURNIQUET:  * No tourniquets in log *  DICTATION: .Note written in EPIC and Other Dictation: Dictation Number [id number]  PLAN OF CARE: Discharge to home after PACU  PATIENT DISPOSITION:  PACU - hemodynamically stable.   Delay start of Pharmacological VTE agent (>24hrs) due to surgical blood loss or risk of bleeding: no

## 2014-11-16 NOTE — Discharge Instructions (Signed)
Aquacel dressing may remain in place until follow up. May shower with aquacel dressing in place. If the dressing becomes saturated or peels off, you may remove it and place a new dressing with gauze and tape which should be kept clean and dry and changed daily. Use sling at times except when exercising or showering No driving for 4-6 weeks No lifting for 6 weeks operative arm Pendulum exercises as instructed. Ok to move wrist, elbow, and hand. See Dr. Tonita Cong in 10-14 days. Resume Plavix. Start Lisinopril tomoprrow.     General Anesthesia, Care After Refer to this sheet in the next few weeks. These instructions provide you with information on caring for yourself after your procedure. Your health care provider may also give you more specific instructions. Your treatment has been planned according to current medical practices, but problems sometimes occur. Call your health care provider if you have any problems or questions after your procedure. WHAT TO EXPECT AFTER THE PROCEDURE After the procedure, it is typical to experience:  Sleepiness.  Nausea and vomiting. HOME CARE INSTRUCTIONS  For the first 24 hours after general anesthesia:  Have a responsible person with you.  Do not drive a car. If you are alone, do not take public transportation.  Do not drink alcohol.  Do not take medicine that has not been prescribed by your health care provider.  Do not sign important papers or make important decisions.  You may resume a normal diet and activities as directed by your health care provider.  Change bandages (dressings) as directed.  If you have questions or problems that seem related to general anesthesia, call the hospital and ask for the anesthetist or anesthesiologist on call. SEEK MEDICAL CARE IF:  You have nausea and vomiting that continue the day after anesthesia.  You develop a rash. SEEK IMMEDIATE MEDICAL CARE IF:   You have difficulty breathing.  You have chest  pain.  You have any allergic problems. Document Released: 05/12/2000 Document Revised: 02/08/2013 Document Reviewed: 08/19/2012 [facility] Patient Information 2015 [place], [place]. This information is not intended to replace advice given to you by your health care provider. Make sure you discuss any questions you have with your health care provider. Shoulder: Pendulum (Clockwise / Counterclockwise)   Bending forward, move arm in a circle clockwise, then counterclockwise. Repeat ____ times each direction per session. Do ____ sessions per week.  Copyright  VHI. All rights reserved.

## 2014-11-16 NOTE — Anesthesia Postprocedure Evaluation (Signed)
  Anesthesia Post-op Note  Patient: Donna Bullock  Procedure(s) Performed: Procedure(s): LEFT MINI OPEN ROTATOR CUFF REPAIR SHOULDER OPEN WITH SUBACROMIAL DECOMPRESSION (Left)  Patient Location: PACU  Anesthesia Type:General  Level of Consciousness: awake and alert   Airway and Oxygen Therapy: Patient Spontanous Breathing  Post-op Pain: mild  Post-op Assessment: Post-op Vital signs reviewed and Patient's Cardiovascular Status Stable              Post-op Vital Signs: Reviewed and stable  Last Vitals:  Filed Vitals:   11/16/14 1008  BP: 128/69  Pulse: 62  Temp: 36.4 C  Resp: 15    Complications: No apparent anesthesia complications

## 2014-11-16 NOTE — Anesthesia Procedure Notes (Signed)
Procedure Name: Intubation Date/Time: 11/16/2014 7:41 AM Performed by: Dione Booze Pre-anesthesia Checklist: Patient identified, Emergency Drugs available, Suction available and Patient being monitored Patient Re-evaluated:Patient Re-evaluated prior to inductionOxygen Delivery Method: Circle system utilized Preoxygenation: Pre-oxygenation with 100% oxygen Intubation Type: IV induction Laryngoscope Size: Mac and 4 Grade View: Grade I Tube type: Oral Tube size: 7.5 mm Number of attempts: 1 Airway Equipment and Method: Stylet Placement Confirmation: ETT inserted through vocal cords under direct vision,  breath sounds checked- equal and bilateral and positive ETCO2 Secured at: 20 cm Tube secured with: Tape Dental Injury: Teeth and Oropharynx as per pre-operative assessment

## 2014-12-07 ENCOUNTER — Encounter (HOSPITAL_COMMUNITY): Payer: Self-pay | Admitting: Specialist

## 2014-12-14 ENCOUNTER — Other Ambulatory Visit: Payer: Self-pay | Admitting: Cardiovascular Disease

## 2014-12-29 ENCOUNTER — Ambulatory Visit: Payer: Managed Care, Other (non HMO) | Admitting: Nurse Practitioner

## 2015-01-12 ENCOUNTER — Ambulatory Visit: Payer: Managed Care, Other (non HMO) | Admitting: Nurse Practitioner

## 2015-01-17 ENCOUNTER — Ambulatory Visit (INDEPENDENT_AMBULATORY_CARE_PROVIDER_SITE_OTHER): Payer: Managed Care, Other (non HMO) | Admitting: Nurse Practitioner

## 2015-01-17 ENCOUNTER — Encounter: Payer: Self-pay | Admitting: Nurse Practitioner

## 2015-01-17 VITALS — BP 158/100 | HR 58 | Ht 67.0 in | Wt 174.8 lb

## 2015-01-17 DIAGNOSIS — I2510 Atherosclerotic heart disease of native coronary artery without angina pectoris: Secondary | ICD-10-CM

## 2015-01-17 DIAGNOSIS — I10 Essential (primary) hypertension: Secondary | ICD-10-CM | POA: Diagnosis not present

## 2015-01-17 DIAGNOSIS — F17201 Nicotine dependence, unspecified, in remission: Secondary | ICD-10-CM

## 2015-01-17 DIAGNOSIS — E7800 Pure hypercholesterolemia, unspecified: Secondary | ICD-10-CM | POA: Diagnosis not present

## 2015-01-17 LAB — BASIC METABOLIC PANEL
BUN: 18 mg/dL (ref 7–25)
CO2: 28 mmol/L (ref 20–31)
Calcium: 9.8 mg/dL (ref 8.6–10.4)
Chloride: 104 mmol/L (ref 98–110)
Creat: 0.92 mg/dL (ref 0.50–0.99)
Glucose, Bld: 97 mg/dL (ref 65–99)
Potassium: 4.1 mmol/L (ref 3.5–5.3)
Sodium: 142 mmol/L (ref 135–146)

## 2015-01-17 MED ORDER — HYDROCHLOROTHIAZIDE 25 MG PO TABS: 25.0000 mg | ORAL_TABLET | Freq: Every day | ORAL | Status: DC

## 2015-01-17 NOTE — Patient Instructions (Addendum)
We will be checking the following labs today - BMET  BMET in one week   Medication Instructions:    Continue with your current medicines. Add  HCTZ 25 mg daily for your blood pressure - this has been sent to [facility]    Testing/Procedures To Be Arranged:  N/A  Follow-Up:   See me in one month    Other Special Instructions:   Monitor your BP at home - if you need a new cuff - get an Omron     If you need a refill on your cardiac medications before your next appointment, please call your pharmacy.   Call the [place] office at [phone] if you have any questions, problems or concerns.

## 2015-01-17 NOTE — Progress Notes (Signed)
CARDIOLOGY OFFICE NOTE  Date:  01/17/2015    Donna Bullock Date of Birth: [birth year] Medical Record [id number]  PCP:  Jani Gravel, MD  Cardiologist:  [facility]    Chief Complaint  Patient presents with  . Coronary Artery Disease    Follow up visit - seen for Dr. [name]  . Hyperlipidemia  . Hypertension    History of Present Illness: Donna Bullock is a 59 y.o. female who presents today for a follow up visit. Seen for Dr. [name]. She is a former patient of Dr. Susa Simmonds that I previously cared for.   She has a history of CAD, HLD, HTN, and tobacco abuse. Her CAD dates back to 1996 when she had a stent placed in her RCA. She had an anterior MI with cardiac arrest (v. Fib) in 2002. Cath showed a 75% distal LAD narrowing and this was managed conservatively with relook cath two days later showing 50% distal stenosis. She then had an inferior MI in 2003 with placement of overlapping Cypher drug eluting stents in the RCA. The LAD and Circumflex had mild disease per report. Four days later, she had severe chest pain, cath showed severe spasm of the LAD and Circumflex which resolved with IC vasodilators. Her last cath was in 2007 and showed patency of stents in the RCA and no other obstructive disease in the LAD and Circumflex. Her last stress test was in November 2009 and showed normal LVEF with no evidence of ischemia. She was seen in the ED 09/02/13 with bright red blood per rectum. Her ASA and Plavix were held. Colonoscopy 09/09/13 per Dr. Collene Mares with polyps removed but no other bleeding source identified. Aspirin has been stopped.   Her other issues include RA, HLD, HTN and past tobacco abuse along with depression/anxiety.  I had not seen her in 5 1/2 years until this past August. She had since gotten married - Donna Bullock who I also saw in the past. Stopped smoking. Some vague symptoms noted - did not want to have stress testing but then needed shoulder surgery and I got her Myoview  updated.   Comes in today. Here alone today. Pretty tearful. She has lost her sister and cousin - both from cancer since I last saw. BP is up here today. BP up at home as well. Recent left shoulder surgery and still with some pain. Admits she has gotten off track with her diet. Not walking. No chest pain. Breathing is ok.   Past Medical History  Diagnosis Date  . Hyperlipidemia   . HTN (hypertension)   . CAD (coronary artery disease)     Inferior MI 1996 tx with bare metal stent RCA. Repeat  anterior MI 2002 with LAD vasospasm. Repeat inferior MI 2003 with occlusion RCA with overlapping Cypher DES in RCA. Repeat cath 2003 with profound vasospasm LAd and Circumflex. Repeat cath 2007  with patent RCA and no disease in LAD or Circumflex.  . Benign tumor of adrenal gland   . Osteopenia   . Family history of adverse reaction to anesthesia     mother had ? problem with anesthesia - was alcoholic - "mind was never right again"  . Myocardial infarction ([place])     times 4 "very minor" stents x2   . Headache     sinus headaches  . Environmental allergies   . Arthritis   . Rotator cuff tear     left  . GERD (gastroesophageal reflux disease)   .  History of stomach ulcers     due to taking aspirin for headaches  . Sleep apnea     uses c pap  . Depression     Past Surgical History  Procedure Laterality Date  . Abdominal hysterectomy    . Back surgery    . Myomectomy      age 17  . Pelvic abcess drainage    . Laparoscopic lysis of adhesions    . Shoulder open rotator cuff repair Left 11/16/2014    Procedure: LEFT MINI OPEN ROTATOR CUFF REPAIR SHOULDER OPEN WITH SUBACROMIAL DECOMPRESSION;  Surgeon: Susa Day, MD;  Location: [facility];  Service: Orthopedics;  Laterality: Left;     Medications: Current Outpatient Prescriptions  Medication Sig Dispense Refill  . Acetaminophen-Caffeine 500-65 MG TABS Take 1 tablet by mouth every 6 (six) hours as needed. (Patient taking differently: Take 1  tablet by mouth every 6 (six) hours as needed (for pain). ) 60 each 0  . atenolol (TENORMIN) 25 MG tablet Take 1 tablet (25 mg total) by mouth daily. 90 tablet 3  . calcium citrate-vitamin D (CITRACAL+D) 315-200 MG-UNIT per tablet Take 1 tablet by mouth 3 (three) times daily.    . cephALEXin (KEFLEX) 500 MG capsule Take 1 capsule (500 mg total) by mouth 4 (four) times daily. 4 capsule 0  . cholecalciferol (VITAMIN D) 1000 UNITS tablet Take 1,000 Units by mouth 2 (two) times daily.    . clopidogrel (PLAVIX) 75 MG tablet TAKE 1 TABLET BY MOUTH DAILY 90 tablet 2  . docusate sodium (COLACE) 100 MG capsule Take 1 capsule (100 mg total) by mouth 2 (two) times daily as needed for mild constipation. 20 capsule 1  . escitalopram (LEXAPRO) 10 MG tablet Take 1 tablet (10 mg total) by mouth daily. (Patient taking differently: Take 10 mg by mouth every morning. ) 90 tablet 3  . fluticasone (FLONASE) 50 MCG/ACT nasal spray Place 1 spray into both nostrils daily.    . isosorbide mononitrate (IMDUR) 30 MG 24 hr tablet TAKE ONE-HALF (1/2) TABLET BY MOUTH DAILY (Patient taking differently: TAKE ONE-HALF (1/2) TABLET BY MOUTH DAILY EVENING) 45 tablet 0  . lisinopril (PRINIVIL,ZESTRIL) 20 MG tablet TAKE 1 TABLET BY MOUTH DAILY (Patient taking differently: TAKE 1 TABLET BY MOUTH DAILY AM) 90 tablet 0  . Multiple Vitamins-Minerals (CENTRUM SILVER PO) Take 1 tablet by mouth at bedtime.    Marland Kitchen omeprazole (PRILOSEC) 20 MG capsule Take 20 mg by mouth daily.    Marland Kitchen oxyCODONE-acetaminophen (PERCOCET) 5-325 MG tablet Take 1-2 tablets by mouth every 4 (four) hours as needed. 60 tablet 0  . simvastatin (ZOCOR) 40 MG tablet TAKE 1 TABLET BY MOUTH AT BEDTIME 90 tablet 0  . hydrochlorothiazide (HYDRODIURIL) 25 MG tablet Take 1 tablet (25 mg total) by mouth daily. 90 tablet 3   No current facility-administered medications for this visit.    Allergies: Allergies  Allergen Reactions  . Other Hives and Diarrhea    Peppers   . Tomato  Diarrhea  . Valium Other (See Comments)    Affects her behavioral    Social History: The patient  reports that she quit smoking about 3 years ago. She has never used smokeless tobacco. She reports that she does not drink alcohol or use illicit drugs.   Family History: The patient's family history includes Alcohol abuse in her father and mother; Breast cancer in her cousin, maternal aunt, maternal aunt, maternal grandmother, and mother; Cancer in her father.   Review of  Systems: Please see the history of present illness.   Otherwise, the review of systems is positive for none.   All other systems are reviewed and negative.   Physical Exam: VS:  BP 158/100 mmHg  Pulse 58  Ht 5\' 7"  (1.702 m)  Wt 174 lb 12.8 oz (79.289 kg)  BMI 27.37 kg/m2  SpO2 98%  LMP 02/18/1995 .  BMI Body mass index is 27.37 kg/(m^2).  Wt Readings from Last 3 Encounters:  01/17/15 174 lb 12.8 oz (79.289 kg)  11/16/14 171 lb (77.565 kg)  11/14/14 171 lb (77.565 kg)    General: Pleasant. Well developed, well nourished and in no acute distress.  HEENT: Normal. Neck: Supple, no JVD, carotid bruits, or masses noted.  Cardiac: Regular rate and rhythm. No murmurs, rubs, or gallops. No edema.  Respiratory:  Lungs are clear to auscultation bilaterally with normal work of breathing.  GI: Soft and nontender.  MS: No deformity or atrophy. Gait and ROM intact. Skin: Warm and dry. Color is normal.  Neuro:  Strength and sensation are intact and no gross focal deficits noted.  Psych: Alert, appropriate and with normal affect.   LABORATORY DATA:  EKG:  EKG is not ordered today.  Lab Results  Component Value Date   WBC 7.3 11/14/2014   HGB 14.1 11/14/2014   HCT 43.6 11/14/2014   PLT 268 11/14/2014   GLUCOSE 86 11/14/2014   CHOL 154 09/13/2014   TRIG 88.0 09/13/2014   HDL 62.30 09/13/2014   LDLCALC 74 09/13/2014   ALT 13 09/15/2014   AST 19 09/15/2014   NA 139 11/14/2014   K 4.1 11/14/2014   CL 105  11/14/2014   CREATININE 0.89 11/14/2014   BUN 20 11/14/2014   CO2 27 11/14/2014   INR 0.90 09/02/2013    BNP (last 3 results) No results for input(s): BNP in the last 8760 hours.  ProBNP (last 3 results) No results for input(s): PROBNP in the last 8760 hours.   Other Studies Reviewed Today: Myoview Study Highlights from 10/2014    Nuclear stress EF: 47%.  The left ventricular ejection fraction is mildly decreased (45-54%).  There was no ST segment deviation noted during stress.  This is a low risk study.  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      Assessment/Plan: 1. CAD: Stable. No chest pains. Doing well on current medical therapy. She is known to have coronary vasospasm. Continues on Plavix given overlapping first generation DES. No longer on aspirin due to prior GI bleeding. Recent Myoview was stable but with mild LV dysfunction.   2. HTN: BP not controlled. Adding HCTZ 25 mg daily. Check BMET today and in one week.   3. Tobacco abuse, in remission: She is using an electronic cigarette occasionally.   4. Hyperlipidemia: Lipids well controlled. Continue statin.  5. Mild LV dysfunction - she is on ACE and beta blocker - no symptoms  6. Depression - she would really like to get back on her Zoloft - I have put her on this in the past - will restart.     Current medicines are reviewed with the patient today.  The patient does not have concerns regarding medicines other than what has been noted above.  The following changes have been made:  See above.  Labs/ tests ordered today include:    Orders  Placed This Encounter  Procedures  . Basic metabolic panel  . Basic metabolic panel     Disposition:   FU with me in 4 week. BMET in one week.    Patient is agreeable to this plan and will call if  any problems develop in the interim.   Signed: Burtis Junes, RN, ANP-C 01/17/2015 9:13 AM  [facility] [address] [place], [place]  [address] Phone: [phone] Fax: [phone]

## 2015-01-24 ENCOUNTER — Other Ambulatory Visit (INDEPENDENT_AMBULATORY_CARE_PROVIDER_SITE_OTHER): Payer: Managed Care, Other (non HMO) | Admitting: *Deleted

## 2015-01-24 DIAGNOSIS — I2510 Atherosclerotic heart disease of native coronary artery without angina pectoris: Secondary | ICD-10-CM | POA: Diagnosis not present

## 2015-01-24 DIAGNOSIS — I10 Essential (primary) hypertension: Secondary | ICD-10-CM

## 2015-01-24 DIAGNOSIS — F17201 Nicotine dependence, unspecified, in remission: Secondary | ICD-10-CM

## 2015-01-24 DIAGNOSIS — E7800 Pure hypercholesterolemia, unspecified: Secondary | ICD-10-CM

## 2015-01-24 LAB — BASIC METABOLIC PANEL
BUN: 24 mg/dL (ref 7–25)
CO2: 27 mmol/L (ref 20–31)
Calcium: 9.9 mg/dL (ref 8.6–10.4)
Chloride: 99 mmol/L (ref 98–110)
Creat: 1.07 mg/dL — ABNORMAL HIGH (ref 0.50–0.99)
Glucose, Bld: 164 mg/dL — ABNORMAL HIGH (ref 65–99)
Potassium: 3.3 mmol/L — ABNORMAL LOW (ref 3.5–5.3)
Sodium: 139 mmol/L (ref 135–146)

## 2015-02-01 ENCOUNTER — Other Ambulatory Visit (INDEPENDENT_AMBULATORY_CARE_PROVIDER_SITE_OTHER): Payer: Managed Care, Other (non HMO) | Admitting: *Deleted

## 2015-02-01 DIAGNOSIS — E785 Hyperlipidemia, unspecified: Secondary | ICD-10-CM | POA: Diagnosis not present

## 2015-02-01 DIAGNOSIS — I2101 ST elevation (STEMI) myocardial infarction involving left main coronary artery: Secondary | ICD-10-CM

## 2015-02-01 LAB — BASIC METABOLIC PANEL
BUN: 24 mg/dL (ref 7–25)
CO2: 26 mmol/L (ref 20–31)
Calcium: 9.5 mg/dL (ref 8.6–10.4)
Chloride: 101 mmol/L (ref 98–110)
Creat: 1.02 mg/dL — ABNORMAL HIGH (ref 0.50–0.99)
Glucose, Bld: 98 mg/dL (ref 65–99)
Potassium: 3.4 mmol/L — ABNORMAL LOW (ref 3.5–5.3)
Sodium: 140 mmol/L (ref 135–146)

## 2015-02-01 NOTE — Addendum Note (Signed)
Addended by: Eulis Foster on: 02/01/2015 12:03 PM   Modules accepted: Orders

## 2015-02-06 ENCOUNTER — Encounter: Payer: Self-pay | Admitting: Nurse Practitioner

## 2015-02-06 ENCOUNTER — Ambulatory Visit (INDEPENDENT_AMBULATORY_CARE_PROVIDER_SITE_OTHER): Payer: Managed Care, Other (non HMO) | Admitting: Nurse Practitioner

## 2015-02-06 VITALS — BP 130/82 | HR 60 | Ht 67.0 in | Wt 174.8 lb

## 2015-02-06 DIAGNOSIS — E7800 Pure hypercholesterolemia, unspecified: Secondary | ICD-10-CM | POA: Diagnosis not present

## 2015-02-06 DIAGNOSIS — I10 Essential (primary) hypertension: Secondary | ICD-10-CM

## 2015-02-06 DIAGNOSIS — I2510 Atherosclerotic heart disease of native coronary artery without angina pectoris: Secondary | ICD-10-CM

## 2015-02-06 LAB — BASIC METABOLIC PANEL
BUN: 20 mg/dL (ref 7–25)
CO2: 27 mmol/L (ref 20–31)
Calcium: 9.4 mg/dL (ref 8.6–10.4)
Chloride: 102 mmol/L (ref 98–110)
Creat: 1 mg/dL — ABNORMAL HIGH (ref 0.50–0.99)
Glucose, Bld: 92 mg/dL (ref 65–99)
Potassium: 3.3 mmol/L — ABNORMAL LOW (ref 3.5–5.3)
Sodium: 141 mmol/L (ref 135–146)

## 2015-02-06 NOTE — Patient Instructions (Addendum)
We will be checking the following labs today - BMET   Medication Instructions:    Continue with your current medicines.     Testing/Procedures To Be Arranged:  N/A  Follow-Up:   See me in 6 months    Other Special Instructions:   Keep a check on your blood pressure for me.     If you need a refill on your cardiac medications before your next appointment, please call your pharmacy.   Call the [place] office at [phone] if you have any questions, problems or concerns.

## 2015-02-06 NOTE — Progress Notes (Signed)
CARDIOLOGY OFFICE NOTE  Date:  02/06/2015    Donna Bullock Date of Birth: Nov 01, 1955 Medical Record [id number]  PCP:  Donna Gravel, MD  Cardiologist:  Donna Bullock    Chief Complaint  Patient presents with  . Coronary Artery Disease    Follow up visit - seen for Donna Bullock  . Hyperlipidemia  . Hypertension    History of Present Illness: Donna Bullock is a 59 y.o. female who presents today for a follow up visit. This is a 3 week check. Seen for Donna Bullock. She is a former patient of Donna Bullock that I previously cared for.   She has a history of CAD, HLD, HTN, and tobacco abuse. Her CAD dates back to 1996 when she had a stent placed in her RCA. She had an anterior MI with cardiac arrest (v. Fib) in 2002. Cath showed a 75% distal LAD narrowing and this was managed conservatively with relook cath two days later showing 50% distal stenosis. She then had an inferior MI in 2003 with placement of overlapping Cypher drug eluting stents in the RCA. The LAD and Circumflex had mild disease per report. Four days later, she had severe chest pain, cath showed severe spasm of the LAD and Circumflex which resolved with IC vasodilators. Her last cath was in 2007 and showed patency of stents in the RCA and no other obstructive disease in the LAD and Circumflex. Her last stress test was in November 2009 and showed normal LVEF with no evidence of ischemia. She was seen in the ED 09/02/13 with bright red blood per rectum. Her ASA and Plavix were held. Colonoscopy 09/09/13 per Donna Bullock with polyps removed but no other bleeding source identified. Aspirin has been stopped.   Her other issues include RA, HLD, HTN and past tobacco abuse along with depression/anxiety.  I had not seen her in 5 1/2 years until this past August of 2016. She had since gotten married - Donna Bullock who I also saw in the past. Stopped smoking. Some vague symptoms noted - did not want to have stress testing but then  needed shoulder surgery and I got her Myoview updated.   Saw last month - lots of death in her family. BP was up. Had gotten off track with taking care of herself. I ended up adding HCTZ for her BP. This has caused some hypokalemia - she has not wanted to take [place] and wanted to try and supplement her diet instead. Last check was only up to 3.4.   Comes in today. Here with Donna Bullock today. She is doing well. BP has improved nicely. Her cuff correlates pretty well. No chest pain. Not short of breath. Has had another death in her family. But says she is back on track with trying to exercise. Not dizzy or lightheaded.    Past Medical History  Diagnosis Date  . Hyperlipidemia   . HTN (hypertension)   . CAD (coronary artery disease)     Inferior MI 1996 tx with bare metal stent RCA. Repeat  anterior MI 2002 with LAD vasospasm. Repeat inferior MI 2003 with occlusion RCA with overlapping Cypher DES in RCA. Repeat cath 2003 with profound vasospasm LAd and Circumflex. Repeat cath 2007  with patent RCA and no disease in LAD or Circumflex.  . Benign tumor of adrenal gland   . Osteopenia   . Family history of adverse reaction to anesthesia     mother had ? problem with anesthesia - was alcoholic - "  mind was never right again"  . Myocardial infarction ([place])     times 4 "very minor" stents x2   . Headache     sinus headaches  . Environmental allergies   . Arthritis   . Rotator cuff tear     left  . GERD (gastroesophageal reflux disease)   . History of stomach ulcers     due to taking aspirin for headaches  . Sleep apnea     uses c pap  . Depression     Past Surgical History  Procedure Laterality Date  . Abdominal hysterectomy    . Back surgery    . Myomectomy      age 28  . Pelvic abcess drainage    . Laparoscopic lysis of adhesions    . Shoulder open rotator cuff repair Left 11/16/2014    Procedure: LEFT MINI OPEN ROTATOR CUFF REPAIR SHOULDER OPEN WITH SUBACROMIAL DECOMPRESSION;  Surgeon:  Donna Day, MD;  Location: [facility];  Service: Orthopedics;  Laterality: Left;     Medications: Current Outpatient Prescriptions  Medication Sig Dispense Refill  . Acetaminophen-Caffeine 500-65 MG TABS Take 1 tablet by mouth every 6 (six) hours as needed. (Patient taking differently: Take 1 tablet by mouth every 6 (six) hours as needed (for pain). ) 60 each 0  . atenolol (TENORMIN) 25 MG tablet Take 1 tablet (25 mg total) by mouth daily. 90 tablet 3  . calcium citrate-vitamin D (CITRACAL+D) 315-200 MG-UNIT per tablet Take 1 tablet by mouth 3 (three) times daily.    . cephALEXin (KEFLEX) 500 MG capsule Take 1 capsule (500 mg total) by mouth 4 (four) times daily. 4 capsule 0  . cholecalciferol (VITAMIN D) 1000 UNITS tablet Take 1,000 Units by mouth 2 (two) times daily.    . clopidogrel (PLAVIX) 75 MG tablet TAKE 1 TABLET BY MOUTH DAILY 90 tablet 2  . docusate sodium (COLACE) 100 MG capsule Take 1 capsule (100 mg total) by mouth 2 (two) times daily as needed for mild constipation. 20 capsule 1  . escitalopram (LEXAPRO) 10 MG tablet Take 1 tablet (10 mg total) by mouth daily. (Patient taking differently: Take 10 mg by mouth every morning. ) 90 tablet 3  . fluticasone (FLONASE) 50 MCG/ACT nasal spray Place 1 spray into both nostrils daily.    . hydrochlorothiazide (HYDRODIURIL) 25 MG tablet Take 1 tablet (25 mg total) by mouth daily. 90 tablet 3  . isosorbide mononitrate (IMDUR) 30 MG 24 hr tablet TAKE ONE-HALF (1/2) TABLET BY MOUTH DAILY (Patient taking differently: TAKE ONE-HALF (1/2) TABLET BY MOUTH DAILY EVENING) 45 tablet 0  . lisinopril (PRINIVIL,ZESTRIL) 20 MG tablet TAKE 1 TABLET BY MOUTH DAILY (Patient taking differently: TAKE 1 TABLET BY MOUTH DAILY AM) 90 tablet 0  . Multiple Vitamins-Minerals (CENTRUM SILVER PO) Take 1 tablet by mouth at bedtime.    Marland Kitchen omeprazole (PRILOSEC) 20 MG capsule Take 20 mg by mouth daily.    Marland Kitchen oxyCODONE-acetaminophen (PERCOCET) 5-325 MG tablet Take 1 tablet by  mouth every 4 (four) hours as needed for moderate pain or severe pain (45 mintues prior to physical therapy).    . simvastatin (ZOCOR) 40 MG tablet TAKE 1 TABLET BY MOUTH AT BEDTIME 90 tablet 0   No current facility-administered medications for this visit.    Allergies: Allergies  Allergen Reactions  . Other Hives and Diarrhea    Peppers   . Tomato Diarrhea  . Valium Other (See Comments)    Affects her behavioral  Social History: The patient  reports that she quit smoking about 3 years ago. She has never used smokeless tobacco. She reports that she does not drink alcohol or use illicit drugs.   Family History: The patient's family history includes Alcohol abuse in her father and mother; Breast cancer in her cousin, maternal aunt, maternal aunt, maternal grandmother, and mother; Cancer in her father.   Review of Systems: Please see the history of present illness.   Otherwise, the review of systems is positive for none.   All other systems are reviewed and negative.   Physical Exam: VS:  BP 130/82 mmHg  Pulse 60  Ht 5\' 7"  (1.702 m)  Wt 174 lb 12.8 oz (79.289 kg)  BMI 27.37 kg/m2  SpO2 98%  LMP 02/18/1995 .  BMI Body mass index is 27.37 kg/(m^2).  Wt Readings from Last 3 Encounters:  02/06/15 174 lb 12.8 oz (79.289 kg)  01/17/15 174 lb 12.8 oz (79.289 kg)  11/16/14 171 lb (77.565 kg)    General: Pleasant. Well developed, well nourished and in no acute distress.  HEENT: Normal. Neck: Supple, no JVD, carotid bruits, or masses noted.  Cardiac: Regular rate and rhythm. No murmurs, rubs, or gallops. No edema.  Respiratory:  Lungs are clear to auscultation bilaterally with normal work of breathing.  GI: Soft and nontender.  MS: No deformity or atrophy. Gait and ROM intact. Skin: Warm and dry. Color is normal.  Neuro:  Strength and sensation are intact and no gross focal deficits noted.  Psych: Alert, appropriate and with normal affect.   LABORATORY DATA:  EKG:  EKG is  not ordered today.  Lab Results  Component Value Date   WBC 7.3 11/14/2014   HGB 14.1 11/14/2014   HCT 43.6 11/14/2014   PLT 268 11/14/2014   GLUCOSE 98 02/01/2015   CHOL 154 09/13/2014   TRIG 88.0 09/13/2014   HDL 62.30 09/13/2014   LDLCALC 74 09/13/2014   ALT 13 09/15/2014   AST 19 09/15/2014   NA 140 02/01/2015   K 3.4* 02/01/2015   CL 101 02/01/2015   CREATININE 1.02* 02/01/2015   BUN 24 02/01/2015   CO2 26 02/01/2015   INR 0.90 09/02/2013    BNP (last 3 results) No results for input(s): BNP in the last 8760 hours.  ProBNP (last 3 results) No results for input(s): PROBNP in the last 8760 hours.   Other Studies Reviewed Today:  Myoview Study Highlights from 10/2014    Nuclear stress EF: 47%.  The left ventricular ejection fraction is mildly decreased (45-54%).  There was no ST segment deviation noted during stress.  This is a low risk study.  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      Assessment/Plan: 1. CAD: Stable. No chest pains. Doing well on current medical therapy with no symptoms reported. She is known to have coronary vasospasm. Continues on Plavix given overlapping first generation DES. No longer on aspirin due to prior GI bleeding. Recent Myoview was stable but with mild LV dysfunction.   2. HTN: BP - added HCTZ at last visit - BP has improved. I have left her on her current regimen.   3. Tobacco abuse, in remission: She is using an electronic cigarette occasionally.   4. Hyperlipidemia: On statin.  5. Mild LV dysfunction - she is on ACE and beta blocker -  no symptoms  6. Hypokalemia - recheck lab today.           Current medicines are reviewed with the patient today.  The patient does not have concerns regarding medicines other than what has been noted above.  The  following changes have been made:  See above.  Labs/ tests ordered today include:    Orders Placed This Encounter  Procedures  . Basic metabolic panel     Disposition:   FU with me in 6 months.   Patient is agreeable to this plan and will call if any problems develop in the interim.   Signed: Burtis Junes, RN, ANP-C 02/06/2015 10:30 AM  [facility] [address] [place], [place]  [address] Phone: [phone] Fax: [phone]

## 2015-02-07 ENCOUNTER — Other Ambulatory Visit: Payer: Self-pay | Admitting: *Deleted

## 2015-02-07 DIAGNOSIS — E876 Hypokalemia: Secondary | ICD-10-CM

## 2015-02-07 MED ORDER — POTASSIUM CHLORIDE ER 10 MEQ PO TBCR: 10.0000 meq | EXTENDED_RELEASE_TABLET | Freq: Every day | ORAL | Status: DC

## 2015-02-08 ENCOUNTER — Other Ambulatory Visit: Payer: Self-pay

## 2015-02-08 DIAGNOSIS — E876 Hypokalemia: Secondary | ICD-10-CM

## 2015-02-08 MED ORDER — POTASSIUM CHLORIDE ER 10 MEQ PO TBCR: 10.0000 meq | EXTENDED_RELEASE_TABLET | Freq: Every day | ORAL | Status: DC

## 2015-02-08 MED ORDER — HYDROCHLOROTHIAZIDE 25 MG PO TABS: 25.0000 mg | ORAL_TABLET | Freq: Every day | ORAL | Status: DC

## 2015-02-23 ENCOUNTER — Other Ambulatory Visit (INDEPENDENT_AMBULATORY_CARE_PROVIDER_SITE_OTHER): Payer: Managed Care, Other (non HMO) | Admitting: *Deleted

## 2015-02-23 DIAGNOSIS — E876 Hypokalemia: Secondary | ICD-10-CM

## 2015-02-23 LAB — BASIC METABOLIC PANEL
BUN: 22 mg/dL (ref 7–25)
CO2: 26 mmol/L (ref 20–31)
Calcium: 10.1 mg/dL (ref 8.6–10.4)
Chloride: 104 mmol/L (ref 98–110)
Creat: 0.99 mg/dL (ref 0.50–0.99)
Glucose, Bld: 100 mg/dL — ABNORMAL HIGH (ref 65–99)
Potassium: 3.9 mmol/L (ref 3.5–5.3)
Sodium: 140 mmol/L (ref 135–146)

## 2015-02-23 NOTE — Addendum Note (Signed)
Addended by: Eulis Foster on: 02/23/2015 09:25 AM   Modules accepted: Orders

## 2015-04-05 ENCOUNTER — Other Ambulatory Visit: Payer: Self-pay | Admitting: Cardiovascular Disease

## 2015-05-18 ENCOUNTER — Other Ambulatory Visit: Payer: Self-pay | Admitting: Cardiovascular Disease

## 2015-07-17 ENCOUNTER — Telehealth: Payer: Self-pay | Admitting: *Deleted

## 2015-07-17 NOTE — Telephone Encounter (Signed)
Moved pt's appointment due to Truitt Merle, NP has funeral.

## 2015-07-24 ENCOUNTER — Ambulatory Visit: Payer: Managed Care, Other (non HMO) | Admitting: Nurse Practitioner

## 2015-07-25 ENCOUNTER — Encounter: Payer: Self-pay | Admitting: Nurse Practitioner

## 2015-07-27 ENCOUNTER — Encounter: Payer: Self-pay | Admitting: Nurse Practitioner

## 2015-07-27 ENCOUNTER — Ambulatory Visit (INDEPENDENT_AMBULATORY_CARE_PROVIDER_SITE_OTHER): Payer: Managed Care, Other (non HMO) | Admitting: Nurse Practitioner

## 2015-07-27 VITALS — BP 120/80 | HR 56 | Ht 67.0 in | Wt 171.8 lb

## 2015-07-27 DIAGNOSIS — I10 Essential (primary) hypertension: Secondary | ICD-10-CM

## 2015-07-27 DIAGNOSIS — I2510 Atherosclerotic heart disease of native coronary artery without angina pectoris: Secondary | ICD-10-CM | POA: Diagnosis not present

## 2015-07-27 DIAGNOSIS — E7800 Pure hypercholesterolemia, unspecified: Secondary | ICD-10-CM

## 2015-07-27 LAB — LIPID PANEL
Cholesterol: 168 mg/dL (ref 125–200)
HDL: 70 mg/dL (ref >=46)
LDL Cholesterol: 72 mg/dL (ref <130)
Total CHOL/HDL Ratio: 2.4 Ratio (ref <=5.0)
Triglycerides: 131 mg/dL (ref <150)
VLDL: 26 mg/dL (ref <30)

## 2015-07-27 LAB — HEPATIC FUNCTION PANEL
ALT: 14 U/L (ref 6–29)
AST: 19 U/L (ref 10–35)
Albumin: 4.4 g/dL (ref 3.6–5.1)
Alkaline Phosphatase: 64 U/L (ref 33–130)
Bilirubin, Direct: 0.1 mg/dL (ref <=0.2)
Indirect Bilirubin: 0.3 mg/dL (ref 0.2–1.2)
Total Bilirubin: 0.4 mg/dL (ref 0.2–1.2)
Total Protein: 6.7 g/dL (ref 6.1–8.1)

## 2015-07-27 LAB — BASIC METABOLIC PANEL
BUN: 23 mg/dL (ref 7–25)
CO2: 27 mmol/L (ref 20–31)
Calcium: 9.6 mg/dL (ref 8.6–10.4)
Chloride: 101 mmol/L (ref 98–110)
Creat: 0.94 mg/dL (ref 0.50–0.99)
Glucose, Bld: 109 mg/dL — ABNORMAL HIGH (ref 65–99)
Potassium: 3.5 mmol/L (ref 3.5–5.3)
Sodium: 139 mmol/L (ref 135–146)

## 2015-07-27 NOTE — Patient Instructions (Addendum)
We will be checking the following labs today - BMET, HPF and lipids   Medication Instructions:    Continue with your current medicines.     Testing/Procedures To Be Arranged:  N/A  Follow-Up:   See me in 6 months    Other Special Instructions:   Call Dr. Julianne Rice office about changing the Lexapro    If you need a refill on your cardiac medications before your next appointment, please call your pharmacy.   Call the [place] office at [phone] if you have any questions, problems or concerns.

## 2015-07-27 NOTE — Progress Notes (Signed)
CARDIOLOGY OFFICE NOTE  Date:  07/27/2015    Donna Bullock Date of Birth: 07/16/1955 Medical Record [id number]  PCP:  Jani Gravel, MD  Cardiologist:  [place]/Dozier Berkovich    Chief Complaint  Patient presents with  . Coronary Artery Disease  . Hypertension  . Hyperlipidemia    6 month check - seen for Dr. [name]    History of Present Illness: Donna Bullock is a 60 y.o. female who presents today for a 6 month check. Seen for Dr. [name]. She is a former patient of Dr. Susa Simmonds that I previously cared for.   She has a history of CAD, HLD, HTN, and tobacco abuse. Her CAD dates back to 1996 when she had a stent placed in her RCA. She had an anterior MI with cardiac arrest (v. Fib) in 2002. Cath showed a 75% distal LAD narrowing and this was managed conservatively with relook cath two days later showing 50% distal stenosis. She then had an inferior MI in 2003 with placement of overlapping Cypher drug eluting stents in the RCA. The LAD and Circumflex had mild disease per report. Four days later, she had severe chest pain, cath showed severe spasm of the LAD and Circumflex which resolved with IC vasodilators. Her last cath was in 2007 and showed patency of stents in the RCA and no other obstructive disease in the LAD and Circumflex. Her last stress test was in November 2009 and showed normal LVEF with no evidence of ischemia. She was seen in the ED 09/02/13 with bright red blood per rectum. Her ASA and Plavix were held. Colonoscopy 09/09/13 per Dr. Collene Mares with polyps removed but no other bleeding source identified. Aspirin has been stopped.   Her other issues include RA, HLD, HTN and past tobacco abuse along with depression/anxiety.  I had not seen her in 5 1/2 years until August of 2016. She had since gotten married - [name] who I also saw in the past. Stopped smoking. Some vague symptoms noted - did not want to have stress testing but then needed shoulder surgery and I got her Myoview  updated.   Last seen by me back in December and she was felt to be doing well.   Comes in today. Here alone. She is doing ok from our standpoint. No chest pain. Breathing is good. Pretty active. Has been clearing a lot - got poison oak - on a steroid dose pack. Asking about using Benadryl. She notes decreased libido with the Lexapro. She tried to wean herself off - got too "bitchy" so she went back on it. She is going to talk with Dr. Julianne Rice office to see if there is an alternative without so much sexual side effects.   Past Medical History  Diagnosis Date  . Hyperlipidemia   . HTN (hypertension)   . CAD (coronary artery disease)     Inferior MI 1996 tx with bare metal stent RCA. Repeat  anterior MI 2002 with LAD vasospasm. Repeat inferior MI 2003 with occlusion RCA with overlapping Cypher DES in RCA. Repeat cath 2003 with profound vasospasm LAd and Circumflex. Repeat cath 2007  with patent RCA and no disease in LAD or Circumflex.  . Benign tumor of adrenal gland   . Osteopenia   . Family history of adverse reaction to anesthesia     mother had ? problem with anesthesia - was alcoholic - "mind was never right again"  . Myocardial infarction ([place])     times 4 "very  minor" stents x2   . Headache     sinus headaches  . Environmental allergies   . Arthritis   . Rotator cuff tear     left  . GERD (gastroesophageal reflux disease)   . History of stomach ulcers     due to taking aspirin for headaches  . Sleep apnea     uses c pap  . Depression     Past Surgical History  Procedure Laterality Date  . Abdominal hysterectomy    . Back surgery    . Myomectomy      age 61  . Pelvic abcess drainage    . Laparoscopic lysis of adhesions    . Shoulder open rotator cuff repair Left 11/16/2014    Procedure: LEFT MINI OPEN ROTATOR CUFF REPAIR SHOULDER OPEN WITH SUBACROMIAL DECOMPRESSION;  Surgeon: Susa Day, MD;  Location: [facility];  Service: Orthopedics;  Laterality: Left;      Medications: Current Outpatient Prescriptions  Medication Sig Dispense Refill  . aspirin-acetaminophen-caffeine (EXCEDRIN MIGRAINE) 250-250-65 MG tablet Take 1 tablet by mouth daily.    Marland Kitchen atenolol (TENORMIN) 25 MG tablet Take 1 tablet (25 mg total) by mouth daily. 90 tablet 3  . calcium citrate-vitamin D (CITRACAL+D) 315-200 MG-UNIT per tablet Take 1 tablet by mouth 3 (three) times daily.    . clopidogrel (PLAVIX) 75 MG tablet TAKE 1 TABLET BY MOUTH DAILY 90 tablet 2  . diltiazem (CARDIZEM CD) 120 MG 24 hr capsule TAKE 1 CAPSULE BY MOUTH DAILY 90 capsule 0  . escitalopram (LEXAPRO) 10 MG tablet Take 10 mg by mouth daily.    . folic acid (FOLVITE) 1 MG tablet Take 1 mg by mouth daily.     . halobetasol (ULTRAVATE) 0.05 % ointment Apply 1 Dose topically 2 (two) times daily.     . hydrochlorothiazide (HYDRODIURIL) 25 MG tablet Take 1 tablet (25 mg total) by mouth daily. 90 tablet 3  . isosorbide mononitrate (IMDUR) 30 MG 24 hr tablet TAKE ONE-HALF (1/2) TABLET BY MOUTH DAILY 45 tablet 0  . lisinopril (PRINIVIL,ZESTRIL) 20 MG tablet TAKE 1 TABLET BY MOUTH DAILY 90 tablet 1  . Multiple Vitamins-Minerals (CENTRUM SILVER PO) Take 1 tablet by mouth at bedtime.    Marland Kitchen omeprazole (PRILOSEC) 20 MG capsule Take 20 mg by mouth daily.    . potassium chloride (K-DUR) 10 MEQ tablet Take 1 tablet (10 mEq total) by mouth daily. 30 tablet 11  . predniSONE (STERAPRED UNI-PAK 21 TAB) 10 MG (21) TBPK tablet Take 10 mg by mouth daily.    . simvastatin (ZOCOR) 40 MG tablet TAKE 1 TABLET BY MOUTH AT BEDTIME 90 tablet 0   No current facility-administered medications for this visit.    Allergies: Allergies  Allergen Reactions  . Other Hives and Diarrhea    Peppers   . Tomato Diarrhea  . Valium Other (See Comments)    Affects her behavioral    Social History: The patient  reports that she quit smoking about 3 years ago. She has never used smokeless tobacco. She reports that she does not drink alcohol or  use illicit drugs.   Family History: The patient's family history includes Alcohol abuse in her father and mother; Breast cancer in her cousin, maternal aunt, maternal aunt, maternal grandmother, and mother; Cancer in her father.   Review of Systems: Please see the history of present illness.   Otherwise, the review of systems is positive for none.   All other systems are reviewed and negative.  Physical Exam: VS:  BP 120/80 mmHg  Pulse 56  Ht 5\' 7"  (1.702 m)  Wt 171 lb 12.8 oz (77.928 kg)  BMI 26.90 kg/m2  LMP 02/18/1995 .  BMI Body mass index is 26.9 kg/(m^2).  Wt Readings from Last 3 Encounters:  07/27/15 171 lb 12.8 oz (77.928 kg)  02/06/15 174 lb 12.8 oz (79.289 kg)  01/17/15 174 lb 12.8 oz (79.289 kg)    General: Pleasant. Well developed, well nourished and in no acute distress.  HEENT: Normal. Neck: Supple, no JVD, carotid bruits, or masses noted.  Cardiac: Regular rate and rhythm. No murmurs, rubs, or gallops. No edema.  Respiratory:  Lungs are clear to auscultation bilaterally with normal work of breathing.  GI: Soft and nontender.  MS: No deformity or atrophy. Gait and ROM intact. Skin: Warm and dry. Color is normal.  Neuro:  Strength and sensation are intact and no gross focal deficits noted.  Psych: Alert, appropriate and with normal affect.   LABORATORY DATA:  EKG:  EKG is not ordered today.  Lab Results  Component Value Date   WBC 7.3 11/14/2014   HGB 14.1 11/14/2014   HCT 43.6 11/14/2014   PLT 268 11/14/2014   GLUCOSE 100* 02/23/2015   CHOL 154 09/13/2014   TRIG 88.0 09/13/2014   HDL 62.30 09/13/2014   LDLCALC 74 09/13/2014   ALT 13 09/15/2014   AST 19 09/15/2014   NA 140 02/23/2015   K 3.9 02/23/2015   CL 104 02/23/2015   CREATININE 0.99 02/23/2015   BUN 22 02/23/2015   CO2 26 02/23/2015   INR 0.90 09/02/2013    BNP (last 3 results) No results for input(s): BNP in the last 8760 hours.  ProBNP (last 3 results) No results for input(s):  PROBNP in the last 8760 hours.   Other Studies Reviewed Today:  Myoview Study Highlights from 10/2014    Nuclear stress EF: 47%.  The left ventricular ejection fraction is mildly decreased (45-54%).  There was no ST segment deviation noted during stress.  This is a low risk study.  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      Assessment/Plan: 1. CAD: Stable. No chest pains. Doing well on current medical therapy with no symptoms reported. She is known to have coronary vasospasm. Continues on Plavix given overlapping first generation DES. No longer on aspirin due to prior GI bleeding. Last Myoview was stable but with mild LV dysfunction.   2. HTN: BP - added HCTZ at last visit - BP looks great on her current regimen.   3. Tobacco abuse, in remission: She is using an electronic cigarette occasionally.   4. Hyperlipidemia: On statin.Lab today  5. Mild LV dysfunction - she is on ACE and beta blocker - no symptoms  6. Hypokalemia - recheck lab today.   7. Decreased libido - most likely from her SSRI therapy - she is doing to talk with Dr. Maudie Mercury and see if other options with less side effects is available - I just really do not prescribe these agents anymore.                Current medicines are reviewed with the patient today.  The patient does not have concerns regarding medicines other than what has been noted above.  The following changes have been made:  See above.  Labs/ tests  ordered today include:    Orders Placed This Encounter  Procedures  . Basic metabolic panel  . Hepatic function panel  . Lipid panel     Disposition:   FU with me in 6 months.   Patient is agreeable to this plan and will call if any problems develop in the interim.   Signed: Burtis Junes, RN, ANP-C 07/27/2015  10:24 AM  [facility] [address] [place], [place]  [address] Phone: [phone] Fax: [phone]

## 2015-10-15 ENCOUNTER — Other Ambulatory Visit: Payer: Self-pay | Admitting: Nurse Practitioner

## 2015-10-15 ENCOUNTER — Other Ambulatory Visit: Payer: Self-pay | Admitting: Cardiovascular Disease

## 2015-10-16 ENCOUNTER — Other Ambulatory Visit: Payer: Self-pay | Admitting: *Deleted

## 2015-10-16 MED ORDER — DILTIAZEM HCL ER COATED BEADS 120 MG PO CP24: 120.0000 mg | ORAL_CAPSULE | Freq: Every day | ORAL | 2 refills | Status: DC

## 2015-10-25 ENCOUNTER — Telehealth: Payer: Self-pay | Admitting: *Deleted

## 2015-10-25 MED ORDER — METOPROLOL SUCCINATE ER 25 MG PO TB24: 25.0000 mg | ORAL_TABLET | Freq: Every day | ORAL | 3 refills | Status: DC

## 2015-10-25 NOTE — Telephone Encounter (Signed)
Pt notified and new prescription sent to [facility]

## 2015-10-25 NOTE — Telephone Encounter (Signed)
Fax received in office from [facility] requesting a change for atenolol 25 mg daily because all strengths of atenolol are unavailable.

## 2015-10-25 NOTE — Telephone Encounter (Signed)
We can change to Toprol XL 25 mg daily. cdm

## 2015-12-14 ENCOUNTER — Other Ambulatory Visit: Payer: Self-pay | Admitting: Internal Medicine

## 2015-12-14 DIAGNOSIS — Z1231 Encounter for screening mammogram for malignant neoplasm of breast: Secondary | ICD-10-CM

## 2016-01-01 ENCOUNTER — Other Ambulatory Visit: Payer: Self-pay | Admitting: Cardiovascular Disease

## 2016-01-15 ENCOUNTER — Ambulatory Visit
Admission: RE | Admit: 2016-01-15 | Discharge: 2016-01-15 | Disposition: A | Discharge status: Home / Self Care | Payer: Managed Care, Other (non HMO) | Source: Ambulatory Visit | Attending: Internal Medicine | Admitting: Internal Medicine

## 2016-01-15 DIAGNOSIS — Z1231 Encounter for screening mammogram for malignant neoplasm of breast: Secondary | ICD-10-CM

## 2016-01-29 NOTE — Progress Notes (Addendum)
CARDIOLOGY OFFICE NOTE  Date:  01/30/2016    Donna Bullock Date of Birth: 1955-08-06 Medical Record #[id number]  PCP:  Jani Gravel, MD  Cardiologist:  Aldona Bar    Chief Complaint  Patient presents with  . Coronary Artery Disease    6 month check - seen for Dr. [name]    History of Present Illness: Donna Bullock is a 60 y.o. female who presents today for a 6 month check. Seen for Dr. [name]. She is a former patient of Dr. Susa Simmonds that I previously cared for.   She has a history of CAD, HLD, HTN, and tobacco abuse. Her CAD dates back to 1996 when she had a stent placed in her RCA. She had an anterior MI with cardiac arrest (v. Fib) in 2002. Cath showed a 75% distal LAD narrowing and this was managed conservatively with relook cath two days later showing 50% distal stenosis. She then had an inferior MI in 2003 with placement of overlapping Cypher drug eluting stents in the RCA. The LAD and Circumflex had mild disease per report. Four days later, she had severe chest pain, cath showed severe spasm of the LAD and Circumflex which resolved with IC vasodilators. Her last cath was in 2007 and showed patency of stents in the RCA and no other obstructive disease in the LAD and Circumflex. Her last stress test was in November 2009 and showed normal LVEF with no evidence of ischemia. She was seen in the ED 09/02/13 with bright red blood per rectum. Her ASA and Plavix were held. Colonoscopy 09/09/13 per Dr. Collene Mares with polyps removed but no other bleeding source identified. Aspirin has been stopped.   Her other issues include RA, HLD, HTN and past tobacco abuse along with depression/anxiety.  I had not seen her in 5 1/2 years until August of 2016. She had since gotten married - Donna Bullock who I also see. Stopped smoking. Some vague symptoms noted - did not want to have stress testing but then needed shoulder surgery and I got her Myoview updated.   Last seen by me back in  June and was felt to be doing well.  She was going to talk with Dr. Julianne Rice office to see if there is an alternative to her Lexapro. She noted decreased libido with the Lexapro. She tried to wean herself off - got too "bitchy" so she went back on.   Comes in today. Here alone. Hurts all over. Notes skin crawling. Takes over 30 minutes to get out of bed - then has to move immediately or will not move. Her muscles hurt. She is on 40 mg of Zocor. No chest pain. Has gained weight. Not exercising. She is researching the [employer] - [name] gave this to her as a gift. Not smoking. No chest pain.    Past Medical History:  Diagnosis Date  . Arthritis   . Benign tumor of adrenal gland   . CAD (coronary artery disease)    Inferior MI 1996 tx with bare metal stent RCA. Repeat  anterior MI 2002 with LAD vasospasm. Repeat inferior MI 2003 with occlusion RCA with overlapping Cypher DES in RCA. Repeat cath 2003 with profound vasospasm LAd and Circumflex. Repeat cath 2007  with patent RCA and no disease in LAD or Circumflex.  . Depression   . Environmental allergies   . Family history of adverse reaction to anesthesia    mother had ? problem with anesthesia - was alcoholic - "  mind was never right again"  . GERD (gastroesophageal reflux disease)   . Headache    sinus headaches  . History of stomach ulcers    due to taking aspirin for headaches  . HTN (hypertension)   . Hyperlipidemia   . Myocardial infarction    times 4 "very minor" stents x2   . Osteopenia   . Rotator cuff tear    left  . Sleep apnea    uses c pap    Past Surgical History:  Procedure Laterality Date  . ABDOMINAL HYSTERECTOMY    . BACK SURGERY    . LAPAROSCOPIC LYSIS OF ADHESIONS    . MYOMECTOMY     age 12  . PELVIC ABCESS DRAINAGE    . SHOULDER OPEN ROTATOR CUFF REPAIR Left 11/16/2014   Procedure: LEFT MINI OPEN ROTATOR CUFF REPAIR SHOULDER OPEN WITH SUBACROMIAL DECOMPRESSION;  Surgeon: Susa Day, MD;  Location: [facility];   Service: Orthopedics;  Laterality: Left;     Medications: Current Outpatient Prescriptions  Medication Sig Dispense Refill  . aspirin-acetaminophen-caffeine (EXCEDRIN MIGRAINE) 250-250-65 MG tablet Take 1 tablet by mouth daily.    . calcium citrate-vitamin D (CITRACAL+D) 315-200 MG-UNIT per tablet Take 1 tablet by mouth 3 (three) times daily.    . clopidogrel (PLAVIX) 75 MG tablet TAKE 1 TABLET BY MOUTH DAILY 90 tablet 2  . diltiazem (CARDIZEM CD) 120 MG 24 hr capsule Take 1 capsule (120 mg total) by mouth daily. 90 capsule 2  . escitalopram (LEXAPRO) 10 MG tablet TAKE 1 TABLET BY MOUTH DAILY 90 tablet 2  . folic acid (FOLVITE) 1 MG tablet Take 1 mg by mouth daily.     . halobetasol (ULTRAVATE) 0.05 % ointment Apply 1 Dose topically 2 (two) times daily.     . hydrochlorothiazide (HYDRODIURIL) 25 MG tablet Take 1 tablet (25 mg total) by mouth daily. 90 tablet 3  . isosorbide mononitrate (IMDUR) 30 MG 24 hr tablet TAKE ONE-HALF (1/2) TABLET BY MOUTH DAILY 45 tablet 2  . lisinopril (PRINIVIL,ZESTRIL) 20 MG tablet TAKE 1 TABLET BY MOUTH DAILY 90 tablet 1  . metoprolol succinate (TOPROL XL) 25 MG 24 hr tablet Take 1 tablet (25 mg total) by mouth daily. 90 tablet 3  . Multiple Vitamins-Minerals (CENTRUM SILVER PO) Take 1 tablet by mouth at bedtime.    Marland Kitchen omeprazole (PRILOSEC) 20 MG capsule Take 20 mg by mouth daily.    . potassium chloride (K-DUR) 10 MEQ tablet Take 1 tablet (10 mEq total) by mouth daily. 30 tablet 11  . simvastatin (ZOCOR) 40 MG tablet TAKE 1 TABLET BY MOUTH AT BEDTIME 90 tablet 2   No current facility-administered medications for this visit.     Allergies: Allergies  Allergen Reactions  . Other Hives and Diarrhea    Peppers   . Tomato Diarrhea  . Valium Other (See Comments)    Affects her behavioral    Social History: The patient  reports that she quit smoking about 4 years ago. She smoked 1.00 pack per day. She has never used smokeless tobacco. She reports that she  does not drink alcohol or use drugs.   Family History: The patient's family history includes Alcohol abuse in her father and mother; Breast cancer in her cousin, maternal aunt, maternal aunt, maternal grandmother, and mother; Cancer in her father.   Review of Systems: Please see the history of present illness.   Otherwise, the review of systems is positive for none.   All other systems are reviewed  and negative.   Physical Exam: VS:  BP 108/70   Pulse 65   Ht _0  (1.702 m)   Wt 179 lb 12.8 oz (81.6 kg)   LMP 02/18/1995   BMI 28.16 kg/m  .  BMI Body mass index is 28.16 kg/m.  Wt Readings from Last 3 Encounters:  01/30/16 179 lb 12.8 oz (81.6 kg)  07/27/15 171 lb 12.8 oz (77.9 kg)  02/06/15 174 lb 12.8 oz (79.3 kg)    General: Pleasant. Well developed, well nourished and in no acute distress.   HEENT: Normal.  Neck: Supple, no JVD, carotid bruits, or masses noted.  Cardiac: Regular rate and rhythm. No murmurs, rubs, or gallops. No edema.  Respiratory:  Lungs are clear to auscultation bilaterally with normal work of breathing.  GI: Soft and nontender.  MS: No deformity or atrophy. Gait and ROM intact.  Skin: Warm and dry. Color is normal.  Neuro:  Strength and sensation are intact and no gross focal deficits noted.  Psych: Alert, appropriate and with normal affect.   LABORATORY DATA:  EKG:  EKG is ordered today. This demonstrates NSR - diffuse T wave changes - unchanged.  Lab Results  Component Value Date   WBC 7.3 11/14/2014   HGB 14.1 11/14/2014   HCT 43.6 11/14/2014   PLT 268 11/14/2014   GLUCOSE 109 (H) 07/27/2015   CHOL 168 07/27/2015   TRIG 131 07/27/2015   HDL 70 07/27/2015   LDLCALC 72 07/27/2015   ALT 14 07/27/2015   AST 19 07/27/2015   NA 139 07/27/2015   K 3.5 07/27/2015   CL 101 07/27/2015   CREATININE 0.94 07/27/2015   BUN 23 07/27/2015   CO2 27 07/27/2015   INR 0.90 09/02/2013    BNP (last 3 results) No results for input(s): BNP in the last  8760 hours.  ProBNP (last 3 results) No results for input(s): PROBNP in the last 8760 hours.   Other Studies Reviewed Today:  Myoview Study Highlights from 10/2014    Nuclear stress EF: 47%.  The left ventricular ejection fraction is mildly decreased (45-54%).  There was no ST segment deviation noted during stress.  This is a low risk study.  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    Assessment/Plan:  1. CAD: Stable. No chest pains. Doing well on current medical therapy with no symptoms reported. She is known to have coronary vasospasm. Continues on Plavix given overlapping first generation DES. No longer on aspirin due to prior GI bleeding. Last Myoview was stable but with mild LV dysfunction.   2. HTN: BP - added HCTZ at last visit - BP looks great on her current regimen.   3. Tobacco abuse, in remission: She is using an electronic cigarette occasionally.   4. Hyperlipidemia: On statin.Lab today - lots of aching - will check lab today. Will give her a 2 week drug holiday from Zocor. May need to change but if she does not improve - would send back to PCP for work up. Lab today to include sed rate and Ck  5. Mild LV dysfunction - she is on ACE and beta blocker - no symptoms  6. Hypokalemia - recheck lab today.   7. Decreased libido - most likely from her SSRI therapy as well as being menopausal - researching the [facility].   Current medicines are  reviewed with the patient today.  The patient does not have concerns regarding medicines other than what has been noted above.  The following changes have been made:  See above.  Labs/ tests ordered today include:    Orders Placed This Encounter  Procedures  . Basic metabolic panel  . CBC  . Hepatic function panel  . Lipid panel  . Sed Rate (ESR)    . CK (Creatine Kinase)  . EKG 12-Lead     Disposition:   FU with me in 6 months.   Patient is agreeable to this plan and will call if any problems develop in the interim.   Signed: Burtis Junes, RN, ANP-C 01/30/2016 11:10 AM  [place] [address] [place], [place]  [address] Phone: [phone] Fax: [phone]

## 2016-01-30 ENCOUNTER — Ambulatory Visit (INDEPENDENT_AMBULATORY_CARE_PROVIDER_SITE_OTHER): Payer: Managed Care, Other (non HMO) | Admitting: Nurse Practitioner

## 2016-01-30 ENCOUNTER — Encounter: Payer: Self-pay | Admitting: Nurse Practitioner

## 2016-01-30 VITALS — BP 108/70 | HR 65 | Ht 67.0 in | Wt 179.8 lb

## 2016-01-30 DIAGNOSIS — I259 Chronic ischemic heart disease, unspecified: Secondary | ICD-10-CM

## 2016-01-30 DIAGNOSIS — E7800 Pure hypercholesterolemia, unspecified: Secondary | ICD-10-CM

## 2016-01-30 LAB — LIPID PANEL
Cholesterol: 197 mg/dL (ref <200)
HDL: 57 mg/dL (ref >50)
LDL Cholesterol: 69 mg/dL (ref <100)
Total CHOL/HDL Ratio: 3.5 Ratio (ref <5.0)
Triglycerides: 354 mg/dL — ABNORMAL HIGH (ref <150)
VLDL: 71 mg/dL — ABNORMAL HIGH (ref <30)

## 2016-01-30 LAB — BASIC METABOLIC PANEL
BUN: 19 mg/dL (ref 7–25)
CO2: 22 mmol/L (ref 20–31)
Calcium: 9.7 mg/dL (ref 8.6–10.4)
Chloride: 102 mmol/L (ref 98–110)
Creat: 1.01 mg/dL — ABNORMAL HIGH (ref 0.50–0.99)
Glucose, Bld: 103 mg/dL — ABNORMAL HIGH (ref 65–99)
Potassium: 3.7 mmol/L (ref 3.5–5.3)
Sodium: 140 mmol/L (ref 135–146)

## 2016-01-30 LAB — CBC
HCT: 42.8 % (ref 35.0–45.0)
Hemoglobin: 14 g/dL (ref 11.7–15.5)
MCH: 29.9 pg (ref 27.0–33.0)
MCHC: 32.7 g/dL (ref 32.0–36.0)
MCV: 91.5 fL (ref 80.0–100.0)
MPV: 10.6 fL (ref 7.5–12.5)
Platelets: 271 10*3/uL (ref 140–400)
RBC: 4.68 MIL/uL (ref 3.80–5.10)
RDW: 13.6 % (ref 11.0–15.0)
WBC: 6.9 10*3/uL (ref 3.8–10.8)

## 2016-01-30 LAB — HEPATIC FUNCTION PANEL
ALT: 23 U/L (ref 6–29)
AST: 29 U/L (ref 10–35)
Albumin: 4.3 g/dL (ref 3.6–5.1)
Alkaline Phosphatase: 75 U/L (ref 33–130)
Bilirubin, Direct: 0 mg/dL (ref <=0.2)
Indirect Bilirubin: 0.3 mg/dL (ref 0.2–1.2)
Total Bilirubin: 0.3 mg/dL (ref 0.2–1.2)
Total Protein: 7 g/dL (ref 6.1–8.1)

## 2016-01-30 LAB — SEDIMENTATION RATE: Sed Rate: 6 mm/hr (ref 0–30)

## 2016-01-30 NOTE — Patient Instructions (Addendum)
We will be checking the following labs today - BMET, CBC, HPF, Lipids, sed rate, CK total   Medication Instructions:    Continue with your current medicines. BUT  I am stopping Simvastatin for the next 2 weeks - will see if this helps your aching.     Testing/Procedures To Be Arranged:  N/A  Follow-Up:   See me in 6 months    Other Special Instructions:   Call me with an update after Christmas about your aching    If you need a refill on your cardiac medications before your next appointment, please call your pharmacy.   Call the [place] office at [phone] if you have any questions, problems or concerns.

## 2016-01-31 LAB — CK: Total CK: 134 U/L (ref 7–177)

## 2016-02-19 ENCOUNTER — Other Ambulatory Visit: Payer: Self-pay | Admitting: Nurse Practitioner

## 2016-02-19 DIAGNOSIS — E876 Hypokalemia: Secondary | ICD-10-CM

## 2016-03-12 ENCOUNTER — Encounter: Payer: Self-pay | Admitting: Nurse Practitioner

## 2016-07-30 ENCOUNTER — Encounter (INDEPENDENT_AMBULATORY_CARE_PROVIDER_SITE_OTHER): Payer: Self-pay

## 2016-07-30 ENCOUNTER — Ambulatory Visit (INDEPENDENT_AMBULATORY_CARE_PROVIDER_SITE_OTHER): Payer: Managed Care, Other (non HMO) | Admitting: Nurse Practitioner

## 2016-07-30 ENCOUNTER — Encounter: Payer: Self-pay | Admitting: Nurse Practitioner

## 2016-07-30 VITALS — BP 108/80 | HR 60 | Ht 67.0 in | Wt 186.0 lb

## 2016-07-30 DIAGNOSIS — R002 Palpitations: Secondary | ICD-10-CM | POA: Diagnosis not present

## 2016-07-30 DIAGNOSIS — E7800 Pure hypercholesterolemia, unspecified: Secondary | ICD-10-CM

## 2016-07-30 DIAGNOSIS — E876 Hypokalemia: Secondary | ICD-10-CM

## 2016-07-30 DIAGNOSIS — I259 Chronic ischemic heart disease, unspecified: Secondary | ICD-10-CM

## 2016-07-30 DIAGNOSIS — I10 Essential (primary) hypertension: Secondary | ICD-10-CM

## 2016-07-30 MED ORDER — CLOPIDOGREL BISULFATE 75 MG PO TABS: 75.0000 mg | ORAL_TABLET | Freq: Every day | ORAL | 3 refills | Status: DC

## 2016-07-30 MED ORDER — OMEPRAZOLE 20 MG PO CPDR: 20.0000 mg | DELAYED_RELEASE_CAPSULE | Freq: Every day | ORAL | 3 refills | Status: AC

## 2016-07-30 MED ORDER — ESCITALOPRAM OXALATE 10 MG PO TABS: 10.0000 mg | ORAL_TABLET | Freq: Every day | ORAL | 3 refills | Status: DC

## 2016-07-30 MED ORDER — DILTIAZEM HCL ER COATED BEADS 120 MG PO CP24: 120.0000 mg | ORAL_CAPSULE | Freq: Every day | ORAL | 3 refills | Status: DC

## 2016-07-30 MED ORDER — SIMVASTATIN 40 MG PO TABS: 40.0000 mg | ORAL_TABLET | Freq: Every day | ORAL | 3 refills | Status: DC

## 2016-07-30 MED ORDER — HYDROCHLOROTHIAZIDE 25 MG PO TABS
25.0000 mg | ORAL_TABLET | Freq: Every day | ORAL | 3 refills | Status: DC
Start: 2016-07-30 — End: 2017-03-10

## 2016-07-30 MED ORDER — POTASSIUM CHLORIDE CRYS ER 10 MEQ PO TBCR: 10.0000 meq | EXTENDED_RELEASE_TABLET | Freq: Every day | ORAL | 3 refills | Status: DC

## 2016-07-30 MED ORDER — LISINOPRIL 20 MG PO TABS: 20.0000 mg | ORAL_TABLET | Freq: Every day | ORAL | 3 refills | Status: DC

## 2016-07-30 MED ORDER — METOPROLOL SUCCINATE ER 25 MG PO TB24: 25.0000 mg | ORAL_TABLET | Freq: Every day | ORAL | 3 refills | Status: DC

## 2016-07-30 MED ORDER — ISOSORBIDE MONONITRATE ER 30 MG PO TB24: ORAL_TABLET | ORAL | 3 refills | Status: DC

## 2016-07-30 NOTE — Progress Notes (Addendum)
CARDIOLOGY OFFICE NOTE  Date:  07/30/2016    Donna Bullock Date of Birth: July 19, 1955 Medical Record #[id number]  PCP:  Jani Gravel, MD  Cardiologist:  Aldona Bar  Chief Complaint  Patient presents with  . Coronary Artery Disease    6 month check - seen for Dr. [name]    History of Present Illness: Donna Bullock is a 60 y.o. female who presents today for a 6 month check. Seen for Dr. [name]. She is a former patient of Dr. Susa Simmonds that I previously cared for.   She has a history of CAD, HLD, HTN, and tobacco abuse. Her CAD dates back to 1996 when she had a stent placed in her RCA. She had an anterior MI with cardiac arrest (v. Fib) in 2002. Cath showed a 75% distal LAD narrowing and this was managed conservatively with relook cath two days later showing 50% distal stenosis. She then had an inferior MI in 2003 with placement of overlapping Cypher drug eluting stents in the RCA. The LAD and Circumflex had mild disease per report. Four days later, she had severe chest pain, cath showed severe spasm of the LAD and Circumflex which resolved with IC vasodilators. Her last cath was in 2007 and showed patency of stents in the RCA and no other obstructive disease in the LAD and Circumflex. Her last stress test was in November 2009 and showed normal LVEF with no evidence of ischemia. She was seen in the ED 09/02/13 with bright red blood per rectum. Her ASA and Plavix were held. Colonoscopy 09/09/13 per Dr. Collene Mares with polyps removed but no other bleeding source identified. Aspirin has been stopped.   Her other issues include RA, HLD, HTN and past tobacco abuse along with depression/anxiety.  I had not seen her in 5 1/2 years until August of 2016. She had since gotten married - [name] who I also see. Stopped smoking. Some vague symptoms noted - did not want to have stress testing but then needed shoulder surgery and I got her Myoview updated.   I saw her back in June of  last year and was felt to be doing well.  She was going to talk with Dr. Julianne Rice office to see if there is an alternative to her Lexapro. She noted decreased libido with the Lexapro. She tried to wean herself off - got too "bitchy" so she went back on. Last visit with me was back in December - more issues with her joints/muscles and other somatic issues but cardiac status was felt to be ok.    Comes in today. Here alone. She continues to do well. No chest pain. Breathing is good. She has had some palpitations - she describes these as "flutters" - happens about every day - does not cause her to feel feel bad - not dizzy or lightheaded but she gets "freaked out". It can happen anytime and whether she is at rest or exercising the gym. She continues to gain weight - but not smoking. Going to the gym 3 times a week. Needs medicines refilled. Still with some libido/sexual issues - she did have the [facility].   Past Medical History:  Diagnosis Date  . Arthritis   . Benign tumor of adrenal gland   . CAD (coronary artery disease)    Inferior MI 1996 tx with bare metal stent RCA. Repeat  anterior MI 2002 with LAD vasospasm. Repeat inferior MI 2003 with occlusion RCA with overlapping Cypher DES in  RCA. Repeat cath 2003 with profound vasospasm LAd and Circumflex. Repeat cath 2007  with patent RCA and no disease in LAD or Circumflex.  . Depression   . Environmental allergies   . Family history of adverse reaction to anesthesia    mother had ? problem with anesthesia - was alcoholic - "mind was never right again"  . GERD (gastroesophageal reflux disease)   . Headache    sinus headaches  . History of stomach ulcers    due to taking aspirin for headaches  . HTN (hypertension)   . Hyperlipidemia   . Myocardial infarction ([place])    times 4 "very minor" stents x2   . Osteopenia   . Rotator cuff tear    left  . Sleep apnea    uses c pap    Past Surgical History:  Procedure Laterality Date  . ABDOMINAL  HYSTERECTOMY    . BACK SURGERY    . LAPAROSCOPIC LYSIS OF ADHESIONS    . MYOMECTOMY     age 37  . PELVIC ABCESS DRAINAGE    . SHOULDER OPEN ROTATOR CUFF REPAIR Left 11/16/2014   Procedure: LEFT MINI OPEN ROTATOR CUFF REPAIR SHOULDER OPEN WITH SUBACROMIAL DECOMPRESSION;  Surgeon: Susa Day, MD;  Location: [facility];  Service: Orthopedics;  Laterality: Left;     Medications: Current Outpatient Prescriptions  Medication Sig Dispense Refill  . aspirin-acetaminophen-caffeine (EXCEDRIN MIGRAINE) 250-250-65 MG tablet Take 1 tablet by mouth daily.    . calcium citrate-vitamin D (CITRACAL+D) 315-200 MG-UNIT per tablet Take 1 tablet by mouth 3 (three) times daily.    . clopidogrel (PLAVIX) 75 MG tablet Take 1 tablet (75 mg total) by mouth daily. 90 tablet 3  . diltiazem (CARDIZEM CD) 120 MG 24 hr capsule Take 1 capsule (120 mg total) by mouth daily. 90 capsule 3  . escitalopram (LEXAPRO) 10 MG tablet Take 1 tablet (10 mg total) by mouth daily. 90 tablet 3  . folic acid (FOLVITE) 1 MG tablet Take 1 mg by mouth daily.     . halobetasol (ULTRAVATE) 0.05 % ointment Apply 1 Dose topically 2 (two) times daily.     . hydrochlorothiazide (HYDRODIURIL) 25 MG tablet Take 1 tablet (25 mg total) by mouth daily. 90 tablet 3  . isosorbide mononitrate (IMDUR) 30 MG 24 hr tablet TAKE ONE-HALF (1/2) TABLET BY MOUTH DAILY 45 tablet 3  . lisinopril (PRINIVIL,ZESTRIL) 20 MG tablet Take 1 tablet (20 mg total) by mouth daily. 90 tablet 3  . metoprolol succinate (TOPROL XL) 25 MG 24 hr tablet Take 1 tablet (25 mg total) by mouth daily. 90 tablet 3  . Multiple Vitamins-Minerals (CENTRUM SILVER PO) Take 1 tablet by mouth at bedtime.    Marland Kitchen omeprazole (PRILOSEC) 20 MG capsule Take 1 capsule (20 mg total) by mouth daily. 90 capsule 3  . potassium chloride (K-DUR,KLOR-CON) 10 MEQ tablet Take 1 tablet (10 mEq total) by mouth daily. 90 tablet 3  . simvastatin (ZOCOR) 40 MG tablet Take 1 tablet (40 mg total) by mouth at bedtime.  90 tablet 3   No current facility-administered medications for this visit.     Allergies: Allergies  Allergen Reactions  . Other Hives and Diarrhea    Peppers   . Tomato Diarrhea  . Valium Other (See Comments)    Affects her behavioral    Social History: The patient  reports that she quit smoking about 4 years ago. She smoked 1.00 pack per day. She has never used smokeless tobacco. She reports  that she does not drink alcohol or use drugs.   Family History: The patient's family history includes Alcohol abuse in her father and mother; Breast cancer in her cousin, maternal aunt, maternal aunt, maternal grandmother, and mother; Cancer in her father.   Review of Systems: Please see the history of present illness.   Otherwise, the review of systems is positive for none.   All other systems are reviewed and negative.   Physical Exam: VS:  BP 108/80 (BP Location: Left Arm, Patient Position: Sitting, Cuff Size: Large)   Pulse 60   Ht 5\' 7"  (1.702 m)   Wt 186 lb (84.4 kg)   LMP 02/18/1995   BMI 29.13 kg/m  .  BMI Body mass index is 29.13 kg/m.  Wt Readings from Last 3 Encounters:  07/30/16 186 lb (84.4 kg)  01/30/16 179 lb 12.8 oz (81.6 kg)  07/27/15 171 lb 12.8 oz (77.9 kg)    General: Pleasant. Well developed, well nourished and in no acute distress. She continues to gain weight.    HEENT: Normal.  Neck: Supple, no JVD, carotid bruits, or masses noted.  Cardiac: Regular rate and rhythm. No murmurs, rubs, or gallops. No edema.  Respiratory:  Lungs are clear to auscultation bilaterally with normal work of breathing.  GI: Soft and nontender.  MS: No deformity or atrophy. Gait and ROM intact.  Skin: Warm and dry. Color is normal.  Neuro:  Strength and sensation are intact and no gross focal deficits noted.  Psych: Alert, appropriate and with normal affect.   LABORATORY DATA:  EKG:  EKG is ordered today. This shows NSR with inferior Q's  Lab Results  Component Value Date     WBC 6.9 01/30/2016   HGB 14.0 01/30/2016   HCT 42.8 01/30/2016   PLT 271 01/30/2016   GLUCOSE 103 (H) 01/30/2016   CHOL 197 01/30/2016   TRIG 354 (H) 01/30/2016   HDL 57 01/30/2016   LDLCALC 69 01/30/2016   ALT 23 01/30/2016   AST 29 01/30/2016   NA 140 01/30/2016   K 3.7 01/30/2016   CL 102 01/30/2016   CREATININE 1.01 (H) 01/30/2016   BUN 19 01/30/2016   CO2 22 01/30/2016   INR 0.90 09/02/2013     BNP (last 3 results) No results for input(s): BNP in the last 8760 hours.  ProBNP (last 3 results) No results for input(s): PROBNP in the last 8760 hours.   Other Studies Reviewed Today:   Myoview Study Highlights from 10/2014    Nuclear stress EF: 47%.  The left ventricular ejection fraction is mildly decreased (45-54%).  There was no ST segment deviation noted during stress.  This is a low risk study.  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    Assessment/Plan:  1. CAD: Stable. No chest pains. Doing well on current medical therapy with no symptoms reported. She is known to have coronary vasospasm. Continues on Plavix given overlapping first generation DES. No longer on aspirin due to prior GI bleeding but does take Excedrin Migraine fairly regularly. Last Myoview was stable but with mild LV dysfunction.   2. HTN:BP looks great on her current regimen.   3. Tobacco abuse, in remission: Not smoking  4. Hyperlipidemia: On statin.Lab from March with her PCP noted - triglycerides have come down to 180.   5. Mild LV dysfunction -  she is on ACE and beta blocker - no symptoms.   6. Palpitations - will arrange for 48 hour Holter - no change in medicines yet. She needs to totally decaffeinate herself. Discussed at length. TSh was checked back in January by PCP and was normal. If she continues to  have this issue - may need to arrange GXT.   Current medicines are reviewed with the patient today.  The patient does not have concerns regarding medicines other than what has been noted above.  The following changes have been made:  See above.  Labs/ tests ordered today include:    Orders Placed This Encounter  Procedures  . Holter monitor - 48 hour  . EKG 12-Lead     Disposition:   FU with me in 6 months - sooner if she continues to have problems with palpitations.   Patient is agreeable to this plan and will call if any problems develop in the interim.   SignedTruitt Merle, NP  07/30/2016 11:29 AM  [facility] [address] [place], [place]  [address] Phone: [phone] Fax: [phone]

## 2016-07-30 NOTE — Patient Instructions (Addendum)
We will be checking the following labs today - NONE   Medication Instructions:    Continue with your current medicines.     Testing/Procedures To Be Arranged:  48 hour Holter  Follow-Up:   See me in 6 months - unless you keep having issues - let me know    Other Special Instructions:   Try to totally decaffeinate yourself  Think about what we talked about today  Gym 4 times a week!!    If you need a refill on your cardiac medications before your next appointment, please call your pharmacy.   Call the [place] office at [phone] if you have any questions, problems or concerns.

## 2016-08-12 ENCOUNTER — Ambulatory Visit (INDEPENDENT_AMBULATORY_CARE_PROVIDER_SITE_OTHER): Payer: Managed Care, Other (non HMO)

## 2016-08-12 DIAGNOSIS — R002 Palpitations: Secondary | ICD-10-CM | POA: Diagnosis not present

## 2016-08-19 ENCOUNTER — Telehealth: Payer: Self-pay | Admitting: Cardiovascular Disease

## 2016-08-19 MED ORDER — METOPROLOL SUCCINATE ER 50 MG PO TB24: 50.0000 mg | ORAL_TABLET | Freq: Every day | ORAL | 3 refills | Status: DC

## 2016-08-19 NOTE — Telephone Encounter (Signed)
Notes recorded by Burnell Blanks, MD on 08/19/2016 at 3:12 PM EDT Can we let her know that her monitor shows early beats? I would have her increase her Toprol to 50 mg daily. Thanks, chris. Pt was called and instructed, medication was sent in and confirmation received. I advised pt that if she has any other problems, questions or concerns to call the office. Pt verbalized understanding.

## 2016-08-19 NOTE — Telephone Encounter (Signed)
-----   Message from Burnell Blanks, MD sent at 08/19/2016  3:12 PM EDT ----- Can we let her know that her monitor shows early beats? I would have her increase her Toprol to 50 mg daily. Thanks, chris

## 2016-09-17 ENCOUNTER — Ambulatory Visit (INDEPENDENT_AMBULATORY_CARE_PROVIDER_SITE_OTHER): Payer: Managed Care, Other (non HMO) | Admitting: Nurse Practitioner

## 2016-09-17 ENCOUNTER — Other Ambulatory Visit: Payer: Self-pay | Admitting: *Deleted

## 2016-09-17 ENCOUNTER — Encounter: Payer: Self-pay | Admitting: Nurse Practitioner

## 2016-09-17 VITALS — BP 106/70 | HR 59 | Ht 67.0 in | Wt 184.0 lb

## 2016-09-17 DIAGNOSIS — I10 Essential (primary) hypertension: Secondary | ICD-10-CM | POA: Diagnosis not present

## 2016-09-17 DIAGNOSIS — R002 Palpitations: Secondary | ICD-10-CM

## 2016-09-17 DIAGNOSIS — I259 Chronic ischemic heart disease, unspecified: Secondary | ICD-10-CM

## 2016-09-17 DIAGNOSIS — I2510 Atherosclerotic heart disease of native coronary artery without angina pectoris: Secondary | ICD-10-CM

## 2016-09-17 DIAGNOSIS — I519 Heart disease, unspecified: Secondary | ICD-10-CM

## 2016-09-17 NOTE — Patient Instructions (Addendum)
We will be checking the following labs today - NONE   Medication Instructions:    Continue with your current medicines.     Testing/Procedures To Be Arranged:  Echocardiogram  GXT  Follow-Up:   Let's see how all this turns out and then we will decide about follow up.      Other Special Instructions:   N/A    If you need a refill on your cardiac medications before your next appointment, please call your pharmacy.   Call the [place] office at [phone] if you have any questions, problems or concerns.

## 2016-09-17 NOTE — Progress Notes (Signed)
CARDIOLOGY OFFICE NOTE  Date:  09/17/2016    [name] Date of Birth: 10-Jan-1956 Medical Record #[id number]  PCP:  Jani Gravel, MD  Cardiologist:  Aldona Bar  Chief Complaint  Patient presents with  . Palpitations    Follow up visit after Holter - seen for Dr. [name]    History of Present Illness: Donna Bullock is a 61 y.o. female who presents today for a work in visit. Seen for Dr. [name]. She is a former patient of Dr. Susa Simmonds that I previously cared for.   She has a history of CAD, HLD, HTN, and tobacco abuse. Her CAD dates back to 1996 when she had a stent placed in her RCA. 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 Cath showed a 75% distal LAD narrowing and this was managed conservatively with relook cath two days later showing 50% distal stenosis. She then had an inferior MI in 2003 with placement of overlapping Cypher drug eluting stents in the RCA. The LAD and Circumflex had mild disease per report. Four days later, she had severe chest pain, cath showed severe spasm of the LAD and Circumflex which resolved with IC vasodilators. Her last cath was in 2007 and showed patency of stents in the RCA and no other obstructive disease in the LAD and Circumflex. Her last stress test was in November 2009 and showed normal LVEF with no evidence of ischemia. She was seen in the ED 09/02/13 with bright red blood per rectum. Her ASA and Plavix were held. Colonoscopy 09/09/13 per Dr. Collene Mares with polyps removed but no other bleeding source identified. Aspirin has been stopped but she uses Excedrin Migraine.   Her other issues include RA, HLD, HTN and past tobacco abuse along with depression/anxiety.  I had not seen her in 5 1/2 years until August of 2016. She had since gotten married - to [employer] who I also see. Stopped smoking. Some vague symptoms noted - did not want to have stress testing but then needed shoulder surgery and I got her Myoview  updated - this turned out ok.   I saw her back in June of last year and was felt to be doing well. She wasgoing to talk with Dr. Julianne Rice office to see if there is an alternative to her Lexapro. She noteddecreased libido with the Lexapro. She tried to wean herself off - got too "bitchy" so she went back on. Has had more issues with her joints/muscles and other somatic issues but cardiac status was felt to be ok.  Last seen back in June - was having palpitations - even with exercise - PVCs noted on Holter - Toprol increased by Dr. [name].  Comes in today. Here with [name]. She continues to have these "flutters". No improvement with the increase in Toprol. BP now soft. Not dizzy. She is scared to exercise. No chest pain. Not smoking. Breathing is good. No swelling.    Past Medical History:  Diagnosis Date  . Arthritis   . Benign tumor of adrenal gland   . CAD (coronary artery disease)    Inferior MI 1996 tx with bare metal stent RCA. Repeat  anterior MI 2002 with LAD vasospasm. Repeat inferior MI 2003 with occlusion RCA with overlapping Cypher DES in RCA. Repeat cath 2003 with profound vasospasm LAd and Circumflex. Repeat cath 2007  with patent RCA and no disease in LAD or Circumflex.  . Depression   . Environmental allergies   .  Family history of adverse reaction to anesthesia    mother had ? problem with anesthesia - was alcoholic - "mind was never right again"  . GERD (gastroesophageal reflux disease)   . Headache    sinus headaches  . History of stomach ulcers    due to taking aspirin for headaches  . HTN (hypertension)   . Hyperlipidemia   . Myocardial infarction ([place])    times 4 "very minor" stents x2   . Osteopenia   . Rotator cuff tear    left  . Sleep apnea    uses c pap    Past Surgical History:  Procedure Laterality Date  . ABDOMINAL HYSTERECTOMY    . BACK SURGERY    . LAPAROSCOPIC LYSIS OF ADHESIONS    . MYOMECTOMY     age 72  . PELVIC ABCESS DRAINAGE    . SHOULDER  OPEN ROTATOR CUFF REPAIR Left 11/16/2014   Procedure: LEFT MINI OPEN ROTATOR CUFF REPAIR SHOULDER OPEN WITH SUBACROMIAL DECOMPRESSION;  Surgeon: Susa Day, MD;  Location: [facility];  Service: Orthopedics;  Laterality: Left;     Medications: Current Meds  Medication Sig  . aspirin-acetaminophen-caffeine (EXCEDRIN MIGRAINE) 250-250-65 MG tablet Take 1 tablet by mouth daily.  . calcium citrate-vitamin D (CITRACAL+D) 315-200 MG-UNIT per tablet Take 1 tablet by mouth 3 (three) times daily.  . clopidogrel (PLAVIX) 75 MG tablet Take 1 tablet (75 mg total) by mouth daily.  Marland Kitchen diltiazem (CARDIZEM CD) 120 MG 24 hr capsule Take 1 capsule (120 mg total) by mouth daily.  Marland Kitchen escitalopram (LEXAPRO) 10 MG tablet Take 1 tablet (10 mg total) by mouth daily.  . folic acid (FOLVITE) 1 MG tablet Take 1 mg by mouth daily.   . halobetasol (ULTRAVATE) 0.05 % ointment Apply 1 Dose topically 2 (two) times daily.   . hydrochlorothiazide (HYDRODIURIL) 25 MG tablet Take 1 tablet (25 mg total) by mouth daily.  . isosorbide mononitrate (IMDUR) 30 MG 24 hr tablet TAKE ONE-HALF (1/2) TABLET BY MOUTH DAILY  . lisinopril (PRINIVIL,ZESTRIL) 20 MG tablet Take 1 tablet (20 mg total) by mouth daily.  . metoprolol succinate (TOPROL-XL) 50 MG 24 hr tablet Take 1 tablet (50 mg total) by mouth daily. Take with or immediately following a meal.  . Multiple Vitamins-Minerals (CENTRUM SILVER PO) Take 1 tablet by mouth at bedtime.  Marland Kitchen omeprazole (PRILOSEC) 20 MG capsule Take 1 capsule (20 mg total) by mouth daily.  . potassium chloride (K-DUR,KLOR-CON) 10 MEQ tablet Take 1 tablet (10 mEq total) by mouth daily.  . simvastatin (ZOCOR) 40 MG tablet Take 1 tablet (40 mg total) by mouth at bedtime.     Allergies: Allergies  Allergen Reactions  . Other Hives and Diarrhea    Peppers   . Tomato Diarrhea  . Valium Other (See Comments)    Affects her behavioral    Social History: The patient  reports that she quit smoking about 4 years  ago. She smoked 1.00 pack per day. She has never used smokeless tobacco. She reports that she does not drink alcohol or use drugs.   Family History: The patient's family history includes Alcohol abuse in her father and mother; Breast cancer in her cousin, maternal aunt, maternal aunt, maternal grandmother, and mother; Cancer in her father.   Review of Systems: Please see the history of present illness.   Otherwise, the review of systems is positive for none.   All other systems are reviewed and negative.   Physical Exam: VS:  BP  106/70 (BP Location: Left Arm, Patient Position: Sitting, Cuff Size: Normal)   Pulse (!) 59   Ht 5\' 7"  (1.702 m)   Wt 184 lb (83.5 kg)   LMP 02/18/1995   SpO2 95%   BMI 28.82 kg/m  .  BMI Body mass index is 28.82 kg/m.  Wt Readings from Last 3 Encounters:  09/17/16 184 lb (83.5 kg)  07/30/16 186 lb (84.4 kg)  01/30/16 179 lb 12.8 oz (81.6 kg)    General: Pleasant. Well developed, well nourished and in no acute distress.   HEENT: Normal.  Neck: Supple, no JVD, carotid bruits, or masses noted.  Cardiac: Regular rate and rhythm. No murmurs, rubs, or gallops. No edema.  Respiratory:  Lungs are clear to auscultation bilaterally with normal work of breathing.  GI: Soft and nontender.  MS: No deformity or atrophy. Gait and ROM intact.  Skin: Warm and dry. Color is normal.  Neuro:  Strength and sensation are intact and no gross focal deficits noted.  Psych: Alert, appropriate and with normal affect.   LABORATORY DATA:  EKG:  EKG is not ordered today.  Lab Results  Component Value Date   WBC 6.9 01/30/2016   HGB 14.0 01/30/2016   HCT 42.8 01/30/2016   PLT 271 01/30/2016   GLUCOSE 103 (H) 01/30/2016   CHOL 197 01/30/2016   TRIG 354 (H) 01/30/2016   HDL 57 01/30/2016   LDLCALC 69 01/30/2016   ALT 23 01/30/2016   AST 29 01/30/2016   NA 140 01/30/2016   K 3.7 01/30/2016   CL 102 01/30/2016   CREATININE 1.01 (H) 01/30/2016   BUN 19 01/30/2016    CO2 22 01/30/2016   INR 0.90 09/02/2013     BNP (last 3 results) No results for input(s): BNP in the last 8760 hours.  ProBNP (last 3 results) No results for input(s): PROBNP in the last 8760 hours.   Other Studies Reviewed Today:   48 HR Holter Study Highlights 07/2016  Sinus rhythm with sinus bradycardia. Lowest heart rate 49 bpm at 4am.  Frequent premature ventricular contractions (760 during monitoring period) with several episodes of bigeminy.  Rare premature atrial contractions      Myoview Study Highlights from 10/2014    Nuclear stress EF: 47%.  The left ventricular ejection fraction is mildly decreased (45-54%).  There was no ST segment deviation noted during stress.  This is a low risk study.  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    Assessment/Plan:  1. Palpitations - noted to have PVCs with bigeminy - Toprol just recently increased. Continue with caffeine cessation and this includes her Excedrin Migraine. Needs echo updated to see if EF has fallen further. Arrange for GXT as well - further disposition to follow.  May need to refer to EP.  2. CAD: Stable. No chest pains. Doing well on current medical therapy with no symptoms reported other than palpitations - identified as PVCs. She is known to have coronary vasospasm. Continues on Plavix given overlapping first generation DES. No longer on aspirin due to prior GI bleeding but does take Excedrin Migraine fairly regularly. Last Myoview was stable but with mild LV dysfunction.   3. HTN:BP is now lower - will make it difficult to further up titrate her medicines.   4. 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 Hyperlipidemia: On statin.  6. Mild LV dysfunction - she is on ACE and now on higher dose of beta blocker due to  palpitations. Getting echo updated. Further disposition to follow.     Current medicines are reviewed with the patient today.  The patient does not have concerns regarding medicines other than what has been noted above.  The following changes have been made:  See above.  Labs/ tests ordered today include:   No orders of the defined types were placed in this encounter.    Disposition:   Further disposition pending results of her studies.    Patient is agreeable to this plan and will call if any problems develop in the interim.   SignedTruitt Merle, NP  09/17/2016 10:25 AM  [facility] [address] [place], [place]  [address] Phone: [phone] Fax: [phone]

## 2016-09-25 ENCOUNTER — Ambulatory Visit (INDEPENDENT_AMBULATORY_CARE_PROVIDER_SITE_OTHER): Payer: Managed Care, Other (non HMO)

## 2016-09-25 ENCOUNTER — Ambulatory Visit (HOSPITAL_COMMUNITY): Payer: Managed Care, Other (non HMO) | Attending: Cardiology

## 2016-09-25 ENCOUNTER — Other Ambulatory Visit: Payer: Self-pay

## 2016-09-25 DIAGNOSIS — Z87891 Personal history of nicotine dependence: Secondary | ICD-10-CM | POA: Insufficient documentation

## 2016-09-25 DIAGNOSIS — I082 Rheumatic disorders of both aortic and tricuspid valves: Secondary | ICD-10-CM | POA: Diagnosis not present

## 2016-09-25 DIAGNOSIS — I252 Old myocardial infarction: Secondary | ICD-10-CM | POA: Diagnosis not present

## 2016-09-25 DIAGNOSIS — R002 Palpitations: Secondary | ICD-10-CM | POA: Diagnosis not present

## 2016-09-25 DIAGNOSIS — E785 Hyperlipidemia, unspecified: Secondary | ICD-10-CM | POA: Diagnosis not present

## 2016-09-25 DIAGNOSIS — I519 Heart disease, unspecified: Secondary | ICD-10-CM

## 2016-09-25 DIAGNOSIS — I2510 Atherosclerotic heart disease of native coronary artery without angina pectoris: Secondary | ICD-10-CM | POA: Insufficient documentation

## 2016-09-25 LAB — EXERCISE TOLERANCE TEST
Estimated workload: 8.4 METS
Exercise duration (min): 6 min
Exercise duration (sec): 58 s
MPHR: 157 {beats}/min
Peak HR: 121 {beats}/min
Percent HR: 77 %
RPE: 17
Rest HR: 57 {beats}/min

## 2016-11-26 ENCOUNTER — Other Ambulatory Visit: Payer: Self-pay | Admitting: Nurse Practitioner

## 2016-11-26 ENCOUNTER — Telehealth: Payer: Self-pay | Admitting: Nurse Practitioner

## 2016-11-26 DIAGNOSIS — I493 Ventricular premature depolarization: Secondary | ICD-10-CM

## 2016-11-26 DIAGNOSIS — R55 Syncope and collapse: Secondary | ICD-10-CM

## 2016-11-26 NOTE — Telephone Encounter (Signed)
Patient in the office today with her husband for his visit. She continues to complain of significant palpitations, heart racing and presyncope with marked diaphoresis.   She sees no improvement with the increase in Toprol.   EF was normal on recent Echo.   Would send to EP to consider AAD therapy or see what other options are available. She notes she is "desperate" to try and feel better. She is basically too scared to now exercise due to this sensation.   Referral to EP placed.   Burtis Junes, RN, [facility] [address] [place], [place]  [id number] [phone]

## 2016-12-03 ENCOUNTER — Encounter: Payer: Self-pay | Admitting: Internal Medicine

## 2016-12-09 ENCOUNTER — Other Ambulatory Visit: Payer: Self-pay | Admitting: Internal Medicine

## 2016-12-09 DIAGNOSIS — Z1231 Encounter for screening mammogram for malignant neoplasm of breast: Secondary | ICD-10-CM

## 2016-12-16 ENCOUNTER — Encounter: Payer: Self-pay | Admitting: Internal Medicine

## 2016-12-16 ENCOUNTER — Ambulatory Visit (INDEPENDENT_AMBULATORY_CARE_PROVIDER_SITE_OTHER): Payer: Managed Care, Other (non HMO) | Admitting: Internal Medicine

## 2016-12-16 VITALS — BP 112/74 | HR 56 | Ht 67.0 in | Wt 193.0 lb

## 2016-12-16 DIAGNOSIS — R002 Palpitations: Secondary | ICD-10-CM

## 2016-12-16 MED ORDER — METOPROLOL SUCCINATE ER 25 MG PO TB24: 25.0000 mg | ORAL_TABLET | Freq: Every day | ORAL | 3 refills | Status: DC

## 2016-12-16 NOTE — Patient Instructions (Addendum)
Medication Instructions:  Your physician has recommended you make the following change in your medication: 1.   Decrease Toprol XL- Take Toprol XL 50 mg (1/2 tablet) by mouth daily.  When this prescription is complete, your next prescription will be for 25 mg daily so take one pill when you renew.   2.  Decrease caffeine intake as directed by Dr. Lovena Le. 3.  Start walking/working out  Labwork: None ordered.  Testing/Procedures: None ordered.  Follow-Up: Your physician wants you to follow-up in: 3 months with Dr. Lovena Le.     Any Other Special Instructions Will Be Listed Below (If Applicable).     If you need a refill on your cardiac medications before your next appointment, please call your pharmacy.

## 2016-12-16 NOTE — Progress Notes (Signed)
HPI [name] is referred today by Truitt Merle or evaluation of palpitations. She is a pleasant 61 yo woman with CAD, s/p stenting remotely who has had exercise induced palpitations. She wore a cardiac monitor which demonstrated PVC's and PAC's. She had less than a 1000 in 24 hours. She has been taking metoprolol and cardizem. She has felt some fatigue since uptitrating her beta blocker.  Allergies  Allergen Reactions  . Other Hives and Diarrhea    Peppers   . Tomato Diarrhea  . Valium Other (See Comments)    Affects her behavioral     Current Outpatient Prescriptions  Medication Sig Dispense Refill  . Acetaminophen-Caffeine (EXCEDRIN ASPIRIN FREE PO) Take 3 tablets by mouth daily.    . calcium citrate-vitamin D (CITRACAL+D) 315-200 MG-UNIT per tablet Take 1 tablet by mouth 3 (three) times daily.    . clopidogrel (PLAVIX) 75 MG tablet Take 1 tablet (75 mg total) by mouth daily. 90 tablet 3  . diltiazem (CARDIZEM CD) 120 MG 24 hr capsule Take 1 capsule (120 mg total) by mouth daily. 90 capsule 3  . escitalopram (LEXAPRO) 10 MG tablet Take 1 tablet (10 mg total) by mouth daily. 90 tablet 3  . folic acid (FOLVITE) 1 MG tablet Take 1 mg by mouth daily.     . halobetasol (ULTRAVATE) 0.05 % ointment Apply 1 Dose topically 2 (two) times daily.     . hydrochlorothiazide (HYDRODIURIL) 25 MG tablet Take 1 tablet (25 mg total) by mouth daily. 90 tablet 3  . isosorbide mononitrate (IMDUR) 30 MG 24 hr tablet TAKE ONE-HALF (1/2) TABLET BY MOUTH DAILY 45 tablet 3  . lisinopril (PRINIVIL,ZESTRIL) 20 MG tablet Take 1 tablet (20 mg total) by mouth daily. 90 tablet 3  . Multiple Vitamins-Minerals (CENTRUM SILVER PO) Take 1 tablet by mouth at bedtime.    Marland Kitchen omeprazole (PRILOSEC) 20 MG capsule Take 1 capsule (20 mg total) by mouth daily. 90 capsule 3  . potassium chloride (K-DUR,KLOR-CON) 10 MEQ tablet Take 1 tablet (10 mEq total) by mouth daily. 90 tablet 3  . simvastatin (ZOCOR) 40 MG tablet Take  1 tablet (40 mg total) by mouth at bedtime. 90 tablet 3  . metoprolol succinate (TOPROL-XL) 50 MG 24 hr tablet Take 1 tablet (50 mg total) by mouth daily. Take with or immediately following a meal. 90 tablet 3   No current facility-administered medications for this visit.      Past Medical History:  Diagnosis Date  . Arthritis   . Benign tumor of adrenal gland   . CAD (coronary artery disease)    Inferior MI 1996 tx with bare metal stent RCA. Repeat  anterior MI 2002 with LAD vasospasm. Repeat inferior MI 2003 with occlusion RCA with overlapping Cypher DES in RCA. Repeat cath 2003 with profound vasospasm LAd and Circumflex. Repeat cath 2007  with patent RCA and no disease in LAD or Circumflex.  . Depression   . Environmental allergies   . Family history of adverse reaction to anesthesia    mother had ? problem with anesthesia - was alcoholic - "mind was never right again"  . GERD (gastroesophageal reflux disease)   . Headache    sinus headaches  . History of stomach ulcers    due to taking aspirin for headaches  . HTN (hypertension)   . Hyperlipidemia   . Myocardial infarction ([place])    times 4 "very minor" stents x2   . Osteopenia   . Rotator  cuff tear    left  . Sleep apnea    uses c pap    ROS:   All systems reviewed and negative except as noted in the HPI.   Past Surgical History:  Procedure Laterality Date  . ABDOMINAL HYSTERECTOMY    . BACK SURGERY    . LAPAROSCOPIC LYSIS OF ADHESIONS    . MYOMECTOMY     age 53  . PELVIC ABCESS DRAINAGE    . SHOULDER OPEN ROTATOR CUFF REPAIR Left 11/16/2014   Procedure: LEFT MINI OPEN ROTATOR CUFF REPAIR SHOULDER OPEN WITH SUBACROMIAL DECOMPRESSION;  Surgeon: Susa Day, MD;  Location: [facility];  Service: Orthopedics;  Laterality: Left;     Family History  Problem Relation Age of Onset  . Alcohol abuse Father   . Cancer Father        lung  . Alcohol abuse Mother   . Breast cancer Mother   . Breast cancer Maternal Aunt     . Breast cancer Maternal Grandmother   . Breast cancer Maternal Aunt   . Breast cancer Cousin      Social History   Social History  . Marital status: Married    Spouse name: N/A  . Number of children: 0  . Years of education: N/A   Occupational History  .  [employer]   Social History Main Topics  . Smoking status: Former Smoker    Packs/day: 1.00    Quit date: 11/14/2011  . Smokeless tobacco: Never Used     Comment: currently using e-cigs  . Alcohol use No  . Drug use: No  . Sexual activity: Yes    Partners: Male    Birth control/ protection: Surgical     Comment: TAH   Other Topics Concern  . Not on file   Social History Narrative  . No narrative on file     LMP 02/18/1995   Physical Exam:  Well appearing NAD HEENT: Unremarkable Neck:  No JVD, no thyromegally Lymphatics:  No adenopathy Back:  No CVA tenderness Lungs:  Clear HEART:  Regular rate rhythm, no murmurs, no rubs, no clicks Abd:  soft, positive bowel sounds, no organomegally, no rebound, no guarding Ext:  2 plus pulses, no edema, no cyanosis, no clubbing Skin:  No rashes no nodules Neuro:  CN II through XII intact, motor grossly intact  EKG - NSR with non-specific STT wave abnormality  Assess/Plan: 1. Palpitations - we discussed the benign nature of her symptoms. I have asked her to wean off of the caffeine.  2. Fatigue - she thinks that increasing her beta blocker has made her more tired. I have asked her to reduce her toprol by half and to start back exercising. 3. CAD - she denies anginal symptoms. She will continue her current meds except as noted above.  Mikle Bosworth.D.

## 2017-01-15 ENCOUNTER — Ambulatory Visit
Admission: RE | Admit: 2017-01-15 | Discharge: 2017-01-15 | Disposition: A | Discharge status: Home / Self Care | Payer: Managed Care, Other (non HMO) | Source: Ambulatory Visit | Attending: Internal Medicine | Admitting: Internal Medicine

## 2017-01-15 DIAGNOSIS — Z1231 Encounter for screening mammogram for malignant neoplasm of breast: Secondary | ICD-10-CM

## 2017-01-28 ENCOUNTER — Ambulatory Visit: Payer: Managed Care, Other (non HMO) | Admitting: Nurse Practitioner

## 2017-01-28 NOTE — Progress Notes (Deleted)
CARDIOLOGY OFFICE NOTE  Date:  01/28/2017    Donna Bullock Date of Birth: [birth year] Medical Record #[id number]  PCP:  Jani Gravel, MD  Cardiologist:  Aldona Bar    No chief complaint on file.   History of Present Illness: Donna Bullock is a 61 y.o. female who presents today for a follow up visit.  Seen for Dr. [name]. She is a former patient of Dr. Susa Simmonds that I previously cared for.   She has a history of CAD, HLD, HTN, and tobacco abuse. Her CAD dates back to 1996 when she had a stent placed in her RCA. 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 Cath showed a 75% distal LAD narrowing and this was managed conservatively with relook cath two days later showing 50% distal stenosis. She then had an inferior MI in 2003 with placement of overlapping Cypher drug eluting stents in the RCA. The LAD and Circumflex had mild disease per report. Four days later, she had severe chest pain, cath showed severe spasm of the LAD and Circumflex which resolved with IC vasodilators. Her last cath was in 2007 and showed patency of stents in the RCA and no other obstructive disease in the LAD and Circumflex. Her last stress test was in November 2009 and showed normal LVEF with no evidence of ischemia. She was seen in the ED 09/02/13 with bright red blood per rectum. Her ASA and Plavix were held. Colonoscopy 09/09/13 per Dr. Collene Mares with polyps removed but no other bleeding source identified. Aspirin has been stopped but she uses Excedrin Migraine.   Her other issues include RA, HLD, HTN and past tobacco abuse along with depression/anxiety.  I had not seen her in 5 1/2 years until August of 2016. She had since gotten married - to [employer] who I also see. Stopped smoking. Some vague symptoms noted - did not want to have stress testing but then needed shoulder surgery and I got her Myoview updated - this turned out ok.   I saw her back in June of last year and was felt to  be doing well. She wasgoing to talk with Dr. Julianne Rice office to see if there is an alternative to her Lexapro. She noteddecreased libido with the Lexapro. She tried to wean herself off - got too "bitchy" so she went back on. Has had more issues with her joints/muscles and other somatic issues but cardiac status was felt to be ok. Last seen back in June of 2018 - was having palpitations - even with exercise - PVCs noted on Holter - Toprol increased by Dr. [name].  Last visit with me back in August - she continued to have palpitations - to the point that she was scared to exercise. Ended up referring to Dr. Lovena Le for his opinion.   Comes in today. Here with [name].    Past Medical History:  Diagnosis Date  . Arthritis   . Benign tumor of adrenal gland   . CAD (coronary artery disease)    Inferior MI 1996 tx with bare metal stent RCA. Repeat  anterior MI 2002 with LAD vasospasm. Repeat inferior MI 2003 with occlusion RCA with overlapping Cypher DES in RCA. Repeat cath 2003 with profound vasospasm LAd and Circumflex. Repeat cath 2007  with patent RCA and no disease in LAD or Circumflex.  . Depression   . Environmental allergies   . Family history of adverse reaction to anesthesia    mother  had ? problem with anesthesia - was alcoholic - "mind was never right again"  . GERD (gastroesophageal reflux disease)   . Headache    sinus headaches  . History of stomach ulcers    due to taking aspirin for headaches  . HTN (hypertension)   . Hyperlipidemia   . Myocardial infarction ([place])    times 4 "very minor" stents x2   . Osteopenia   . Rotator cuff tear    left  . Sleep apnea    uses c pap    Past Surgical History:  Procedure Laterality Date  . ABDOMINAL HYSTERECTOMY    . BACK SURGERY    . LAPAROSCOPIC LYSIS OF ADHESIONS    . MYOMECTOMY     age 5  . PELVIC ABCESS DRAINAGE    . SHOULDER OPEN ROTATOR CUFF REPAIR Left 11/16/2014   Procedure: LEFT MINI OPEN ROTATOR CUFF REPAIR SHOULDER  OPEN WITH SUBACROMIAL DECOMPRESSION;  Surgeon: Susa Day, MD;  Location: [facility];  Service: Orthopedics;  Laterality: Left;     Medications: No outpatient medications have been marked as taking for the 01/28/17 encounter (Appointment) with Burtis Junes, NP.     Allergies: Allergies  Allergen Reactions  . Other Hives and Diarrhea    Peppers   . Tomato Diarrhea  . Valium Other (See Comments)    Affects her behavioral    Social History: The patient  reports that she quit smoking about 5 years ago. She smoked 1.00 pack per day. she has never used smokeless tobacco. She reports that she does not drink alcohol or use drugs.   Family History: The patient's family history includes Alcohol abuse in her father and mother; Breast cancer in her cousin, maternal aunt, maternal aunt, maternal grandmother, and mother; Cancer in her father.   Review of Systems: Please see the history of present illness.   Otherwise, the review of systems is positive for none.   All other systems are reviewed and negative.   Physical Exam: VS:  LMP 02/18/1995  .  BMI There is no height or weight on file to calculate BMI.  Wt Readings from Last 3 Encounters:  12/16/16 193 lb (87.5 kg)  09/17/16 184 lb (83.5 kg)  07/30/16 186 lb (84.4 kg)    General: Pleasant. Well developed, well nourished and in no acute distress.   HEENT: Normal.  Neck: Supple, no JVD, carotid bruits, or masses noted.  Cardiac: Regular rate and rhythm. No murmurs, rubs, or gallops. No edema.  Respiratory:  Lungs are clear to auscultation bilaterally with normal work of breathing.  GI: Soft and nontender.  MS: No deformity or atrophy. Gait and ROM intact.  Skin: Warm and dry. Color is normal.  Neuro:  Strength and sensation are intact and no gross focal deficits noted.  Psych: Alert, appropriate and with normal affect.   LABORATORY DATA:  EKG:  EKG is ordered today. This demonstrates .  Lab Results  Component Value Date    WBC 6.9 01/30/2016   HGB 14.0 01/30/2016   HCT 42.8 01/30/2016   PLT 271 01/30/2016   GLUCOSE 103 (H) 01/30/2016   CHOL 197 01/30/2016   TRIG 354 (H) 01/30/2016   HDL 57 01/30/2016   LDLCALC 69 01/30/2016   ALT 23 01/30/2016   AST 29 01/30/2016   NA 140 01/30/2016   K 3.7 01/30/2016   CL 102 01/30/2016   CREATININE 1.01 (H) 01/30/2016   BUN 19 01/30/2016   CO2 22 01/30/2016  INR 0.90 09/02/2013     BNP (last 3 results) No results for input(s): BNP in the last 8760 hours.  ProBNP (last 3 results) No results for input(s): PROBNP in the last 8760 hours.   Other Studies Reviewed Today:  48 HR Holter Study Highlights 07/2016  Sinus rhythm with sinus bradycardia. Lowest heart rate 49 bpm at 4am.  Frequent premature ventricular contractions (760 during monitoring period) with several episodes of bigeminy.  Rare premature atrial contractions         Myoview Study Highlights from 10/2014    Nuclear stress EF: 47%.  The left ventricular ejection fraction is mildly decreased (45-54%).  There was no ST segment deviation noted during stress.  This is a low risk study.  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    Assessment/Plan:  1. Palpitations - noted to have PVCs with bigeminy - Toprol just recently increased. Continue with caffeine cessation and this includes her Excedrin Migraine. Needs echo updated to see if EF has fallen further. Arrange for GXT as well - further disposition to follow.  May need to refer to EP.  2. CAD: Stable. No chest pains. Doing well on current medical therapy with no symptoms reported other than palpitations - identified as PVCs. She is known to have coronary vasospasm. Continues on Plavix given overlapping first generation DES. No longer on aspirin due to prior GI  bleeding but does take Excedrin Migraine fairly regularly. Last Myoview was stable but with mild LV dysfunction.   3. HTN:BP is now lower - will make it difficult to further up titrate her medicines.   4. 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 Hyperlipidemia: On statin.  6. Mild LV dysfunction - she is on ACE and now on higher dose of beta blocker due to palpitations. Getting echo updated. Further disposition to follow.    Current medicines are reviewed with the patient today.  The patient does not have concerns regarding medicines other than what has been noted above.  The following changes have been made:  See above.  Labs/ tests ordered today include:   No orders of the defined types were placed in this encounter.    Disposition:   FU with *** in {gen number 7-34:193790} {Days to years:10300}.   Patient is agreeable to this plan and will call if any problems develop in the interim.   SignedTruitt Merle, NP  01/28/2017 9:10 AM  [facility] [address] [place], [place]  [address] Phone: [phone] Fax: [phone]

## 2017-02-24 ENCOUNTER — Telehealth: Payer: Self-pay | Admitting: *Deleted

## 2017-02-24 ENCOUNTER — Ambulatory Visit: Payer: Managed Care, Other (non HMO) | Admitting: Nurse Practitioner

## 2017-02-24 NOTE — Telephone Encounter (Signed)
Pt called in today to reschedule appt due to stomach bug and fever.  Rescheduled pt with Cecille Rubin.

## 2017-02-24 NOTE — Progress Notes (Deleted)
CARDIOLOGY OFFICE NOTE  Date:  02/24/2017    Donna Bullock Date of Birth: [birth year] Medical Record #[id number]  PCP:  Jani Gravel, MD  Cardiologist:  Servando Snare & ***    No chief complaint on file.   History of Present Illness: Donna Bullock is a 62 y.o. female who presents today for a ***   Comes in today. Here with   Past Medical History:  Diagnosis Date  . Arthritis   . Benign tumor of adrenal gland   . CAD (coronary artery disease)    Inferior MI 1996 tx with bare metal stent RCA. Repeat  anterior MI 2002 with LAD vasospasm. Repeat inferior MI 2003 with occlusion RCA with overlapping Cypher DES in RCA. Repeat cath 2003 with profound vasospasm LAd and Circumflex. Repeat cath 2007  with patent RCA and no disease in LAD or Circumflex.  . Depression   . Environmental allergies   . Family history of adverse reaction to anesthesia    mother had ? problem with anesthesia - was alcoholic - "mind was never right again"  . GERD (gastroesophageal reflux disease)   . Headache    sinus headaches  . History of stomach ulcers    due to taking aspirin for headaches  . HTN (hypertension)   . Hyperlipidemia   . Myocardial infarction ([place])    times 4 "very minor" stents x2   . Osteopenia   . Rotator cuff tear    left  . Sleep apnea    uses c pap    Past Surgical History:  Procedure Laterality Date  . ABDOMINAL HYSTERECTOMY    . BACK SURGERY    . LAPAROSCOPIC LYSIS OF ADHESIONS    . MYOMECTOMY     age 86  . PELVIC ABCESS DRAINAGE    . SHOULDER OPEN ROTATOR CUFF REPAIR Left 11/16/2014   Procedure: LEFT MINI OPEN ROTATOR CUFF REPAIR SHOULDER OPEN WITH SUBACROMIAL DECOMPRESSION;  Surgeon: Susa Day, MD;  Location: [facility];  Service: Orthopedics;  Laterality: Left;     Medications: No outpatient medications have been marked as taking for the 02/24/17 encounter (Appointment) with Burtis Junes, NP.     Allergies: Allergies  Allergen Reactions  . Other Hives  and Diarrhea    Peppers   . Tomato Diarrhea  . Valium Other (See Comments)    Affects her behavioral    Social History: The patient  reports that she quit smoking about 5 years ago. She smoked 1.00 pack per day. she has never used smokeless tobacco. She reports that she does not drink alcohol or use drugs.   Family History: The patient's ***family history includes Alcohol abuse in her father and mother; Breast cancer in her cousin, maternal aunt, maternal aunt, maternal grandmother, and mother; Cancer in her father.   Review of Systems: Please see the history of present illness.   Otherwise, the review of systems is positive for {NONE DEFAULTED:18576::"none"}.   All other systems are reviewed and negative.   Physical Exam: VS:  LMP 02/18/1995  .  BMI There is no height or weight on file to calculate BMI.  Wt Readings from Last 3 Encounters:  12/16/16 193 lb (87.5 kg)  09/17/16 184 lb (83.5 kg)  07/30/16 186 lb (84.4 kg)    General: Pleasant. Well developed, well nourished and in no acute distress.   HEENT: Normal.  Neck: Supple, no JVD, carotid bruits, or masses noted.  Cardiac: ***Regular rate and rhythm. No murmurs, rubs,  or gallops. No edema.  Respiratory:  Lungs are clear to auscultation bilaterally with normal work of breathing.  GI: Soft and nontender.  MS: No deformity or atrophy. Gait and ROM intact.  Skin: Warm and dry. Color is normal.  Neuro:  Strength and sensation are intact and no gross focal deficits noted.  Psych: Alert, appropriate and with normal affect.   LABORATORY DATA:  EKG:  EKG {ACTION; IS/IS XBW:[id number]} ordered today. This demonstrates ***.  Lab Results  Component Value Date   WBC 6.9 01/30/2016   HGB 14.0 01/30/2016   HCT 42.8 01/30/2016   PLT 271 01/30/2016   GLUCOSE 103 (H) 01/30/2016   CHOL 197 01/30/2016   TRIG 354 (H) 01/30/2016   HDL 57 01/30/2016   LDLCALC 69 01/30/2016   ALT 23 01/30/2016   AST 29 01/30/2016   NA 140 01/30/2016    K 3.7 01/30/2016   CL 102 01/30/2016   CREATININE 1.01 (H) 01/30/2016   BUN 19 01/30/2016   CO2 22 01/30/2016   INR 0.90 09/02/2013     BNP (last 3 results) No results for input(s): BNP in the last 8760 hours.  ProBNP (last 3 results) No results for input(s): PROBNP in the last 8760 hours.   Other Studies Reviewed Today:   Assessment/Plan:   Current medicines are reviewed with the patient today.  The patient does not have concerns regarding medicines other than what has been noted above.  The following changes have been made:  See above.  Labs/ tests ordered today include:   No orders of the defined types were placed in this encounter.    Disposition:   FU with *** in {gen number 4-16:[id number]} {Days to years:10300}.   Patient is agreeable to this plan and will call if any problems develop in the interim.   SignedTruitt Merle, NP  02/24/2017 7:32 AM  [facility] [address] [place], [place]  [address] Phone: [phone] Fax: [phone]

## 2017-02-27 ENCOUNTER — Encounter: Payer: Self-pay | Admitting: *Deleted

## 2017-03-09 ENCOUNTER — Encounter: Payer: Self-pay | Admitting: Nurse Practitioner

## 2017-03-09 ENCOUNTER — Ambulatory Visit (INDEPENDENT_AMBULATORY_CARE_PROVIDER_SITE_OTHER): Payer: Managed Care, Other (non HMO) | Admitting: Nurse Practitioner

## 2017-03-09 VITALS — BP 110/80 | HR 60 | Ht 67.0 in | Wt 188.8 lb

## 2017-03-09 DIAGNOSIS — I2510 Atherosclerotic heart disease of native coronary artery without angina pectoris: Secondary | ICD-10-CM

## 2017-03-09 DIAGNOSIS — R202 Paresthesia of skin: Secondary | ICD-10-CM

## 2017-03-09 LAB — BASIC METABOLIC PANEL WITH GFR
BUN/Creatinine Ratio: 25 (ref 12–28)
BUN: 21 mg/dL (ref 8–27)
CO2: 23 mmol/L (ref 20–29)
Calcium: 9.5 mg/dL (ref 8.7–10.3)
Chloride: 101 mmol/L (ref 96–106)
Creatinine, Ser: 0.85 mg/dL (ref 0.57–1.00)
GFR calc Af Amer: 84 mL/min/1.73
GFR calc non Af Amer: 73 mL/min/1.73
Glucose: 100 mg/dL — ABNORMAL HIGH (ref 65–99)
Potassium: 3.2 mmol/L — ABNORMAL LOW (ref 3.5–5.2)
Sodium: 141 mmol/L (ref 134–144)

## 2017-03-09 LAB — TSH: TSH: 1.19 u[IU]/mL (ref 0.450–4.500)

## 2017-03-09 NOTE — Progress Notes (Signed)
CARDIOLOGY OFFICE NOTE  Date:  03/09/2017    Donna Bullock Date of Birth: 1955-09-27 Medical Record #[id number]  PCP:  Jani Gravel, MD  Cardiologist:  Aldona Bar   Chief Complaint  Patient presents with  . Coronary Artery Disease  . Hypertension  . Palpitations    Follow up visit - seen for Dr. [name]    History of Present Illness: Donna Bullock is a 62 y.o. female who presents today for a follow up visit. Seen for Dr. [name]. She is a former patient of Dr. Susa Simmonds that I previously cared for.   She has a history of CAD, HLD, HTN, and tobacco abuse. Her CAD dates back to 1996 when she had a stent placed in her RCA. 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 Cath showed a 75% distal LAD narrowing and this was managed conservatively with relook cath two days later showing 50% distal stenosis. She then had an inferior MI in 2003 with placement of overlapping Cypher drug eluting stents in the RCA. The LAD and Circumflex had mild disease per report. Four days later, she had severe chest pain, cath showed severe spasm of the LAD and Circumflex which resolved with IC vasodilators. Her last cath was in 2007 and showed patency of stents in the RCA and no other obstructive disease in the LAD and Circumflex. Her last stress test was in November 2009 and showed normal LVEF with no evidence of ischemia. She was seen in the ED 09/02/13 with bright red blood per rectum. Her ASA and Plavix were held. Colonoscopy 09/09/13 per Dr. Collene Mares with polyps removed but no other bleeding source identified. Aspirin has been stopped but she uses Excedrin Migraine.   Her other issues include RA, HLD, HTN and past tobacco abuse along with depression/anxiety.  I had not seen her in 5 1/2 years until August of 2016. She had since gotten married - to [employer] who I also see. Stopped smoking. Some vague symptoms noted - did not want to have stress testing but then needed shoulder  surgery and I got her Myoview updated - this turned out ok.   I have followed her since then - she has basically done ok. Had more palpitations over the past 6 months - we have gotten a heart monitor. She has had her echo updated. Myoview was from 2016. Ended up sending her to see Dr. Lovena Le for EP. Reassurance given.   Comes in today. Here alone. [name] has had more issues with his back and she is basically taking care of him. She is doing better. She is cutting her portions in half. She has lost 5 pounds. Has not returned to the gym since [name] is not doing well. Less palpitations and she is cutting back her caffeine. No exertional chest pain. She does note some hot flashes. Her hands and feet get tingling at times. Overall, she feels like she is doing ok.   Past Medical History:  Diagnosis Date  . Arthritis   . Benign tumor of adrenal gland   . CAD (coronary artery disease)    Inferior MI 1996 tx with bare metal stent RCA. Repeat  anterior MI 2002 with LAD vasospasm. Repeat inferior MI 2003 with occlusion RCA with overlapping Cypher DES in RCA. Repeat cath 2003 with profound vasospasm LAd and Circumflex. Repeat cath 2007  with patent RCA and no disease in LAD or Circumflex.  . Depression   . Environmental  allergies   . Family history of adverse reaction to anesthesia    mother had ? problem with anesthesia - was alcoholic - "mind was never right again"  . GERD (gastroesophageal reflux disease)   . Headache    sinus headaches  . History of stomach ulcers    due to taking aspirin for headaches  . HTN (hypertension)   . Hyperlipidemia   . Myocardial infarction ([place])    times 4 "very minor" stents x2   . Osteopenia   . Rotator cuff tear    left  . Sleep apnea    uses c pap    Past Surgical History:  Procedure Laterality Date  . ABDOMINAL HYSTERECTOMY    . BACK SURGERY    . LAPAROSCOPIC LYSIS OF ADHESIONS    . MYOMECTOMY     age 22  . PELVIC ABCESS DRAINAGE    . SHOULDER OPEN  ROTATOR CUFF REPAIR Left 11/16/2014   Procedure: LEFT MINI OPEN ROTATOR CUFF REPAIR SHOULDER OPEN WITH SUBACROMIAL DECOMPRESSION;  Surgeon: Susa Day, MD;  Location: [facility];  Service: Orthopedics;  Laterality: Left;     Medications: Current Meds  Medication Sig  . Acetaminophen-Caffeine (EXCEDRIN ASPIRIN FREE PO) Take 3 tablets by mouth daily.  . calcium citrate-vitamin D (CITRACAL+D) 315-200 MG-UNIT per tablet Take 1 tablet by mouth 3 (three) times daily.  . clopidogrel (PLAVIX) 75 MG tablet Take 1 tablet (75 mg total) by mouth daily.  Marland Kitchen diltiazem (CARDIZEM CD) 120 MG 24 hr capsule Take 1 capsule (120 mg total) by mouth daily.  Marland Kitchen escitalopram (LEXAPRO) 10 MG tablet Take 1 tablet (10 mg total) by mouth daily.  . hydrochlorothiazide (HYDRODIURIL) 25 MG tablet Take 1 tablet (25 mg total) by mouth daily.  . isosorbide mononitrate (IMDUR) 30 MG 24 hr tablet TAKE ONE-HALF (1/2) TABLET BY MOUTH DAILY  . lisinopril (PRINIVIL,ZESTRIL) 20 MG tablet Take 1 tablet (20 mg total) by mouth daily.  . metoprolol succinate (TOPROL-XL) 25 MG 24 hr tablet Take 1 tablet (25 mg total) by mouth daily. Take with or immediately following a meal.  . Multiple Vitamins-Minerals (CENTRUM SILVER PO) Take 1 tablet by mouth at bedtime.  Marland Kitchen omeprazole (PRILOSEC) 20 MG capsule Take 1 capsule (20 mg total) by mouth daily.  . potassium chloride (K-DUR,KLOR-CON) 10 MEQ tablet Take 1 tablet (10 mEq total) by mouth daily.  . Ranibizumab (LUCENTIS IO) Inject into the eye as directed. Eye injections once monthly- unsure of dose  . simvastatin (ZOCOR) 40 MG tablet Take 1 tablet (40 mg total) by mouth at bedtime.     Allergies: Allergies  Allergen Reactions  . Other Hives and Diarrhea    Peppers   . Tomato Diarrhea  . Valium Other (See Comments)    Affects her behavioral    Social History: The patient  reports that she quit smoking about 5 years ago. She smoked 1.00 pack per day. she has never used smokeless tobacco.  She reports that she does not drink alcohol or use drugs.   Family History: The patient's family history includes Alcohol abuse in her father and mother; Breast cancer in her cousin, maternal aunt, maternal aunt, maternal grandmother, and mother; Cancer in her father.   Review of Systems: Please see the history of present illness.   Otherwise, the review of systems is positive for none.   All other systems are reviewed and negative.   Physical Exam: VS:  BP 110/80 (BP Location: Left Arm, Patient Position: Sitting, Cuff Size:  Normal)   Pulse 60   Ht 5\' 7"  (1.702 m)   Wt 188 lb 12.8 oz (85.6 kg)   LMP 02/18/1995   BMI 29.57 kg/m  .  BMI Body mass index is 29.57 kg/m.  Wt Readings from Last 3 Encounters:  03/09/17 188 lb 12.8 oz (85.6 kg)  12/16/16 193 lb (87.5 kg)  09/17/16 184 lb (83.5 kg)    General: Pleasant. Well developed, well nourished and in no acute distress.   HEENT: Normal.  Neck: Supple, no JVD, carotid bruits, or masses noted.  Cardiac: Regular rate and rhythm. No murmurs, rubs, or gallops. No edema.  Respiratory:  Lungs are clear to auscultation bilaterally with normal work of breathing.  GI: Soft and nontender.  MS: No deformity or atrophy. Gait and ROM intact.  Skin: Warm and dry. Color is normal.  Neuro:  Strength and sensation are intact and no gross focal deficits noted.  Psych: Alert, appropriate and with normal affect.   LABORATORY DATA:  EKG:  EKG is not ordered today.  Lab Results  Component Value Date   WBC 6.9 01/30/2016   HGB 14.0 01/30/2016   HCT 42.8 01/30/2016   PLT 271 01/30/2016   GLUCOSE 103 (H) 01/30/2016   CHOL 197 01/30/2016   TRIG 354 (H) 01/30/2016   HDL 57 01/30/2016   LDLCALC 69 01/30/2016   ALT 23 01/30/2016   AST 29 01/30/2016   NA 140 01/30/2016   K 3.7 01/30/2016   CL 102 01/30/2016   CREATININE 1.01 (H) 01/30/2016   BUN 19 01/30/2016   CO2 22 01/30/2016   INR 0.90 09/02/2013     BNP (last 3 results) No results  for input(s): BNP in the last 8760 hours.  ProBNP (last 3 results) No results for input(s): PROBNP in the last 8760 hours.   Other Studies Reviewed Today:   Assessment/Plan:  Echo Study Conclusions 09/2016  - Left ventricle: The cavity size was mildly dilated. Systolic   function was normal. The estimated ejection fraction was in the   range of 55% to 60%. Wall motion was normal; there were no   regional wall motion abnormalities. Features are consistent with   a pseudonormal left ventricular filling pattern, with concomitant   abnormal relaxation and increased filling pressure (grade 2   diastolic dysfunction). Doppler parameters are consistent with   high ventricular filling pressure. - Aortic valve: There was mild regurgitation. - Mitral valve: There was trivial regurgitation.   48 HR Holter Study Highlights 07/2016  Sinus rhythm with sinus bradycardia. Lowest heart rate 49 bpm at 4am.  Frequent premature ventricular contractions (760 during monitoring period) with several episodes of bigeminy.  Rare premature atrial contractions         Myoview Study Highlights from 10/2014    Nuclear stress EF: 47%.  The left ventricular ejection fraction is mildly decreased (45-54%).  There was no ST segment deviation noted during stress.  This is a low risk study.  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    Assessment/Plan:  1. Palpitations - she has had PVCs with bigeminy - Echo stable. GXT was stable. She has seen EP - she is doing better clinically - trying to use less caffeine. She does not feel like she needs to go back and see EP.   2. CAD: Stable. No chest pains. Doing  well on current medical therapy with no symptoms reported other than palpitations - identified as PVCs. She is known to have  coronary vasospasm. Continues on Plavix given overlapping first generation DES. No longer on aspirin due to prior GI bleeding but does take excessive amount of Excedrin Migraine fairly regularly. Last Myoview was stable but with mild LV dysfunction  Improved by last echo.    3. HTN:BP is now lower - I have asked her to monitor - may need to cut back the HCTZ - she will monitor.   4. 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 Hyperlipidemia: On statin.  6. Mild LV dysfunction - she is on ACE and now on higher dose of beta blocker due to palpitations. Most recent echo improved - continue with current regimen.    7. Tingling - recheck her labs - not sure what to make of this.   Current medicines are reviewed with the patient today.  The patient does not have concerns regarding medicines other than what has been noted above.  The following changes have been made:  See above.  Labs/ tests ordered today include:    Orders Placed This Encounter  Procedures  . Basic metabolic panel  . TSH     Disposition:   FU with me in April.     Patient is agreeable to this plan and will call if any problems develop in the interim.   SignedTruitt Merle, NP  03/09/2017 12:42 PM  [facility] [address] [place], [place]  [address] Phone: [phone] Fax: [phone]

## 2017-03-09 NOTE — Patient Instructions (Addendum)
We will be checking the following labs today - BMET and TSH   Medication Instructions:    Continue with your current medicines.     Testing/Procedures To Be Arranged:  N/A  Follow-Up:   See me in April   Other Special Instructions:   Monitor your BP for me - let me know if it is running like what it is here.     If you need a refill on your cardiac medications before your next appointment, please call your pharmacy.   Call the [place] office at [phone] if you have any questions, problems or concerns.

## 2017-03-10 ENCOUNTER — Other Ambulatory Visit: Payer: Self-pay | Admitting: *Deleted

## 2017-03-10 DIAGNOSIS — I2510 Atherosclerotic heart disease of native coronary artery without angina pectoris: Secondary | ICD-10-CM

## 2017-03-10 DIAGNOSIS — E785 Hyperlipidemia, unspecified: Secondary | ICD-10-CM

## 2017-03-10 DIAGNOSIS — E876 Hypokalemia: Secondary | ICD-10-CM

## 2017-03-10 MED ORDER — POTASSIUM CHLORIDE CRYS ER 10 MEQ PO TBCR: 10.0000 meq | EXTENDED_RELEASE_TABLET | Freq: Two times a day (BID) | ORAL | 3 refills | Status: DC

## 2017-03-10 MED ORDER — HYDROCHLOROTHIAZIDE 25 MG PO TABS: 12.5000 mg | ORAL_TABLET | Freq: Every day | ORAL | 9 refills | Status: DC

## 2017-03-16 ENCOUNTER — Other Ambulatory Visit: Payer: Self-pay | Admitting: Cardiovascular Disease

## 2017-03-16 DIAGNOSIS — E876 Hypokalemia: Secondary | ICD-10-CM

## 2017-03-16 MED ORDER — POTASSIUM CHLORIDE CRYS ER 10 MEQ PO TBCR: 10.0000 meq | EXTENDED_RELEASE_TABLET | Freq: Two times a day (BID) | ORAL | 3 refills | Status: DC

## 2017-03-18 ENCOUNTER — Ambulatory Visit: Payer: Managed Care, Other (non HMO) | Admitting: Internal Medicine

## 2017-03-18 MED ORDER — POTASSIUM CHLORIDE CRYS ER 10 MEQ PO TBCR: 10.0000 meq | EXTENDED_RELEASE_TABLET | Freq: Two times a day (BID) | ORAL | 3 refills | Status: DC

## 2017-03-18 NOTE — Addendum Note (Signed)
Addended by: Derl Barrow on: 03/18/2017 08:45 AM   Modules accepted: Orders

## 2017-03-18 NOTE — Telephone Encounter (Signed)
Pt's medication was resent to pt's pharmacy because dosage was incorrect. Pt takes medication BID, only 90 tablets were sent in for a 90 day supply. I resent Rx in for 180 tablets for 90 day supply. Confirmation received.

## 2017-03-20 ENCOUNTER — Other Ambulatory Visit: Payer: Managed Care, Other (non HMO) | Admitting: *Deleted

## 2017-03-20 DIAGNOSIS — E876 Hypokalemia: Secondary | ICD-10-CM

## 2017-03-20 DIAGNOSIS — E785 Hyperlipidemia, unspecified: Secondary | ICD-10-CM

## 2017-03-20 DIAGNOSIS — I2510 Atherosclerotic heart disease of native coronary artery without angina pectoris: Secondary | ICD-10-CM

## 2017-03-20 LAB — BASIC METABOLIC PANEL
BUN/Creatinine Ratio: 16 (ref 12–28)
BUN: 13 mg/dL (ref 8–27)
CO2: 22 mmol/L (ref 20–29)
Calcium: 9.5 mg/dL (ref 8.7–10.3)
Chloride: 102 mmol/L (ref 96–106)
Creatinine, Ser: 0.83 mg/dL (ref 0.57–1.00)
GFR calc Af Amer: 87 mL/min/{1.73_m2} (ref >59)
GFR calc non Af Amer: 75 mL/min/{1.73_m2} (ref >59)
Glucose: 105 mg/dL — ABNORMAL HIGH (ref 65–99)
Potassium: 3.8 mmol/L (ref 3.5–5.2)
Sodium: 142 mmol/L (ref 134–144)

## 2017-03-20 LAB — HEPATIC FUNCTION PANEL
ALT: 17 IU/L (ref 0–32)
AST: 20 IU/L (ref 0–40)
Albumin: 4.2 g/dL (ref 3.6–4.8)
Alkaline Phosphatase: 87 IU/L (ref 39–117)
Bilirubin Total: 0.3 mg/dL (ref 0.0–1.2)
Bilirubin, Direct: 0.1 mg/dL (ref 0.00–0.40)
Total Protein: 6.3 g/dL (ref 6.0–8.5)

## 2017-03-20 LAB — LIPID PANEL
Chol/HDL Ratio: 2.9 ratio (ref 0.0–4.4)
Cholesterol, Total: 145 mg/dL (ref 100–199)
HDL: 50 mg/dL (ref >39)
LDL Calculated: 58 mg/dL (ref 0–99)
Triglycerides: 184 mg/dL — ABNORMAL HIGH (ref 0–149)
VLDL Cholesterol Cal: 37 mg/dL (ref 5–40)

## 2017-05-06 ENCOUNTER — Ambulatory Visit (INDEPENDENT_AMBULATORY_CARE_PROVIDER_SITE_OTHER): Payer: Managed Care, Other (non HMO) | Admitting: Obstetrics and Gynecology

## 2017-05-06 ENCOUNTER — Other Ambulatory Visit: Payer: Self-pay

## 2017-05-06 ENCOUNTER — Encounter: Payer: Self-pay | Admitting: Obstetrics and Gynecology

## 2017-05-06 VITALS — BP 122/80 | HR 60 | Resp 16 | Ht 66.25 in | Wt 182.0 lb

## 2017-05-06 DIAGNOSIS — N952 Postmenopausal atrophic vaginitis: Secondary | ICD-10-CM | POA: Diagnosis not present

## 2017-05-06 DIAGNOSIS — Z01419 Encounter for gynecological examination (general) (routine) without abnormal findings: Secondary | ICD-10-CM

## 2017-05-06 DIAGNOSIS — M8580 Other specified disorders of bone density and structure, unspecified site: Secondary | ICD-10-CM

## 2017-05-06 DIAGNOSIS — N762 Acute vulvitis: Secondary | ICD-10-CM | POA: Diagnosis not present

## 2017-05-06 DIAGNOSIS — R32 Unspecified urinary incontinence: Secondary | ICD-10-CM | POA: Diagnosis not present

## 2017-05-06 DIAGNOSIS — Z803 Family history of malignant neoplasm of breast: Secondary | ICD-10-CM | POA: Diagnosis not present

## 2017-05-06 LAB — POCT URINALYSIS DIPSTICK
BILIRUBIN UA: NEGATIVE
Blood, UA: NEGATIVE
GLUCOSE UA: NEGATIVE
KETONES UA: NEGATIVE
Leukocytes, UA: NEGATIVE
Nitrite, UA: NEGATIVE
Protein, UA: NEGATIVE
pH, UA: 5 (ref 5.0–8.0)

## 2017-05-06 MED ORDER — CLOBETASOL PROPIONATE 0.05 % EX OINT: TOPICAL_OINTMENT | CUTANEOUS | 0 refills | Status: DC

## 2017-05-06 MED ORDER — HYDROXYZINE HCL 10 MG PO TABS: 10.0000 mg | ORAL_TABLET | Freq: Three times a day (TID) | ORAL | 0 refills | Status: DC | PRN

## 2017-05-06 NOTE — Progress Notes (Signed)
62 y.o. [id number] MarriedCaucasianF here for annual exam.  H/O hysterectomy. She c/o a 3 month h/o vulvar burning, irritation, itching. No vaginal discharge.  She had the [place] lisa treatment in 3/18, thinks she was burned at the opening of her vagina with the last treatment. Feels hard at the opening of her vagina, sore.  She leaks urine, thinks it is irritating the opening of her vagina. She can leak on the way to the bathroom, after she voids or with valsalva. Always wears pad. She typically leaks several times a day, small amounts. She saw urology in the past, was told nothing was wrong. The worst part of her leakage is on the way to the bathroom and the dripping after she voids. No vaginal bulging.  She does have a h/o frequent yeast infections.  She did the [place] lisa to try to be sexually active, has been able to be sexually active with lubricant, but not with her irritation.     Patient's last menstrual period was 02/18/1995.          Sexually active: No.  The current method of family planning is post menopausal status.    Exercising: Yes.    circuit/ very active / gym Smoker:  Yes- she smoked E- Cigs now Health Maintenance: Pap:  Unsure- hysterectomy  History of abnormal Pap:  no MMG:  01-15-17 WNL  Colonoscopy:  2015- polyps-  Dr Collene Mares  BMD:   09-18-14 low bone mass  TDaP:  unsure Gardasil: N/A   reports that she quit smoking about 5 years ago. Her smoking use included cigarettes. She smoked 1.00 pack per day. she has never used smokeless tobacco. She reports that she does not drink alcohol or use drugs. She and her husband have been married for 5 years, happy together.   Past Medical History:  Diagnosis Date  . Arthritis   . Benign tumor of adrenal gland   . CAD (coronary artery disease)    Inferior MI 1996 tx with bare metal stent RCA. Repeat  anterior MI 2002 with LAD vasospasm. Repeat inferior MI 2003 with occlusion RCA with overlapping Cypher DES in RCA. Repeat cath 2003 with  profound vasospasm LAd and Circumflex. Repeat cath 2007  with patent RCA and no disease in LAD or Circumflex.  . Depression   . Environmental allergies   . Family history of adverse reaction to anesthesia    mother had ? problem with anesthesia - was alcoholic - "mind was never right again"  . GERD (gastroesophageal reflux disease)   . Headache    sinus headaches  . Heart murmur   . History of stomach ulcers    due to taking aspirin for headaches  . HTN (hypertension)   . Hyperlipidemia   . Myocardial infarction ([place])    times 4 "very minor" stents x2   . Osteopenia   . Osteoporosis   . Rotator cuff tear    left  . Sleep apnea    uses c pap  . Urinary incontinence     Past Surgical History:  Procedure Laterality Date  . ABDOMINAL HYSTERECTOMY    . BACK SURGERY    . LAPAROSCOPIC LYSIS OF ADHESIONS    . MYOMECTOMY     age 33  . PELVIC ABCESS DRAINAGE    . SHOULDER OPEN ROTATOR CUFF REPAIR Left 11/16/2014   Procedure: LEFT MINI OPEN ROTATOR CUFF REPAIR SHOULDER OPEN WITH SUBACROMIAL DECOMPRESSION;  Surgeon: Susa Day, MD;  Location: [facility];  Service: Orthopedics;  Laterality: Left;    Current Outpatient Medications  Medication Sig Dispense Refill  . Acetaminophen-Caffeine (EXCEDRIN ASPIRIN FREE PO) Take 3 tablets by mouth daily.    . calcium citrate-vitamin D (CITRACAL+D) 315-200 MG-UNIT per tablet Take 1 tablet by mouth 3 (three) times daily.    . clopidogrel (PLAVIX) 75 MG tablet Take 1 tablet (75 mg total) by mouth daily. 90 tablet 3  . diltiazem (CARDIZEM CD) 120 MG 24 hr capsule Take 1 capsule (120 mg total) by mouth daily. 90 capsule 3  . escitalopram (LEXAPRO) 10 MG tablet Take 1 tablet (10 mg total) by mouth daily. 90 tablet 3  . hydrochlorothiazide (HYDRODIURIL) 25 MG tablet Take 0.5 tablets (12.5 mg total) by mouth daily. 30 tablet 9  . isosorbide mononitrate (IMDUR) 30 MG 24 hr tablet TAKE ONE-HALF (1/2) TABLET BY MOUTH DAILY 45 tablet 3  . lisinopril  (PRINIVIL,ZESTRIL) 20 MG tablet Take 1 tablet (20 mg total) by mouth daily. 90 tablet 3  . metoprolol succinate (TOPROL-XL) 25 MG 24 hr tablet Take 1 tablet (25 mg total) by mouth daily. Take with or immediately following a meal. 90 tablet 3  . Multiple Vitamins-Minerals (CENTRUM SILVER PO) Take 1 tablet by mouth at bedtime.    Marland Kitchen omeprazole (PRILOSEC) 20 MG capsule Take 1 capsule (20 mg total) by mouth daily. 90 capsule 3  . potassium chloride (K-DUR,KLOR-CON) 10 MEQ tablet Take 1 tablet (10 mEq total) by mouth 2 (two) times daily. 180 tablet 3  . Ranibizumab (LUCENTIS IO) Inject into the eye as directed. Eye injections once monthly- unsure of dose    . simvastatin (ZOCOR) 40 MG tablet Take 1 tablet (40 mg total) by mouth at bedtime. 90 tablet 3   No current facility-administered medications for this visit.     Family History  Problem Relation Age of Onset  . Alcohol abuse Father   . Cancer Father        lung  . Lung cancer Father   . Alcohol abuse Mother   . Breast cancer Mother   . Breast cancer Maternal Aunt   . Breast cancer Maternal Grandmother   . Breast cancer Maternal Aunt   . Breast cancer Cousin   . Leukemia Sister   Maudry Diego had BRCA testing, not sure of the results Her sister died of leukemia  Review of Systems  Constitutional: Negative.   HENT: Negative.   Eyes: Negative.   Respiratory: Negative.   Cardiovascular: Negative.   Gastrointestinal: Negative.   Endocrine: Negative.   Genitourinary: Positive for dyspareunia.       Vaginal itching, dryness and irritation   Musculoskeletal: Negative.   Skin: Negative.   Allergic/Immunologic: Negative.   Neurological: Negative.   Psychiatric/Behavioral: Negative.     Exam:   BP 122/80 (BP Location: Right Arm, Patient Position: Sitting, Cuff Size: Normal)   Pulse 60   Resp 16   Ht 5' 6.25" (1.683 m)   Wt 182 lb (82.6 kg)   LMP 02/18/1995   BMI 29.15 kg/m   Weight change: @WEIGHTCHANGE @ Height:   Height: 5' 6.25"  (168.3 cm)  Ht Readings from Last 3 Encounters:  05/06/17 5' 6.25" (1.683 m)  03/09/17 5' 7"  (1.702 m)  12/16/16 5' 7"  (1.702 m)    General appearance: alert, cooperative and appears stated age Head: Normocephalic, without obvious abnormality, atraumatic Neck: no adenopathy, supple, symmetrical, trachea midline and thyroid normal to inspection and palpation Lungs: clear to auscultation bilaterally Cardiovascular: regular rate and rhythm Breasts: normal appearance,  no masses or tenderness Abdomen: soft, non-tender; non distended,  no masses,  no organomegaly Extremities: extremities normal, atraumatic, no cyanosis or edema Skin: Skin color, texture, turgor normal. No rashes or lesions Lymph nodes: Cervical, supraclavicular, and axillary nodes normal. No abnormal inguinal nodes palpated Neurologic: Grossly normal   Pelvic: External genitalia:  Erythema, whitening, minimal agglutination of the upper labia minora, agglutination of the lower labia minora.               Urethra:  normal appearing urethra with no masses, tenderness or lesions              Bartholins and Skenes: normal                 Vagina: normal appearing vagina with normal color and discharge, no lesions              Cervix: absent               Bimanual Exam:  Uterus:  uterus absent              Adnexa: no mass, fullness, tenderness               Rectovaginal: Confirms               Anus:  normal sphincter tone, no lesions  Chaperone was present for exam.  A:  Well Woman exam  Severe vulvitis  Urinary incontinence, mixed and after voiding. Likely contributing to the vulvitis  Vaginal atrophy  Strong FH of breast cancer  Osteopenia    P:   No Pap  Mammogram UTD  Referral to genetics  Referral to urology  Urine for ua, c&s  Affirm  Steroid ointment BID x 2 weeks  Atarax for prn use, specifically to help with sleep  Reviewed vulvar skin care, can use Vaseline externally as needed  F/U in 2 weeks  DEXA  ordered  Could consider vaginal estrogen

## 2017-05-06 NOTE — Patient Instructions (Signed)
Set up a DEXA (bone density  EXERCISE AND DIET:  We recommended that you start or continue a regular exercise program for good health. Regular exercise means any activity that makes your heart beat faster and makes you sweat.  We recommend exercising at least 30 minutes per day at least 3 days a week, preferably 4 or 5.  We also recommend a diet low in fat and sugar.  Inactivity, poor dietary choices and obesity can cause diabetes, heart attack, stroke, and kidney damage, among others.    ALCOHOL AND SMOKING:  Women should limit their alcohol intake to no more than 7 drinks/beers/glasses of wine (combined, not each!) per week. Moderation of alcohol intake to this level decreases your risk of breast cancer and liver damage. And of course, no recreational drugs are part of a healthy lifestyle.  And absolutely no smoking or even second hand smoke. Most people know smoking can cause heart and lung diseases, but did you know it also contributes to weakening of your bones? Aging of your skin?  Yellowing of your teeth and nails?  CALCIUM AND VITAMIN D:  Adequate intake of calcium and Vitamin D are recommended.  The recommendations for exact amounts of these supplements seem to change often, but generally speaking 600 mg of calcium (either carbonate or citrate) and 800 units of Vitamin D per day seems prudent. Certain women may benefit from higher intake of Vitamin D.  If you are among these women, your doctor will have told you during your visit.    PAP SMEARS:  Pap smears, to check for cervical cancer or precancers,  have traditionally been done yearly, although recent scientific advances have shown that most women can have pap smears less often.  However, every woman still should have a physical exam from her gynecologist every year. It will include a breast check, inspection of the vulva and vagina to check for abnormal growths or skin changes, a visual exam of the cervix, and then an exam to evaluate the size  and shape of the uterus and ovaries.  And after 64 years of age, a rectal exam is indicated to check for rectal cancers. We will also provide age appropriate advice regarding health maintenance, like when you should have certain vaccines, screening for sexually transmitted diseases, bone density testing, colonoscopy, mammograms, etc.   MAMMOGRAMS:  All women over 73 years old should have a yearly mammogram. Many facilities now offer a "3D" mammogram, which may cost around $50 extra out of pocket. If possible,  we recommend you accept the option to have the 3D mammogram performed.  It both reduces the number of women who will be called back for extra views which then turn out to be normal, and it is better than the routine mammogram at detecting truly abnormal areas.    COLONOSCOPY:  Colonoscopy to screen for colon cancer is recommended for all women at age 1.  We know, you hate the idea of the prep.  We agree, BUT, having colon cancer and not knowing it is worse!!  Colon cancer so often starts as a polyp that can be seen and removed at colonscopy, which can quite literally save your life!  And if your first colonoscopy is normal and you have no family history of colon cancer, most women don't have to have it again for 10 years.  Once every ten years, you can do something that may end up saving your life, right?  We will be happy to help you  get it scheduled when you are ready.  Be sure to check your insurance coverage so you understand how much it will cost.  It may be covered as a preventative service at no cost, but you should check your particular policy.

## 2017-05-07 ENCOUNTER — Telehealth: Payer: Self-pay | Admitting: *Deleted

## 2017-05-07 LAB — VAGINITIS/VAGINOSIS, DNA PROBE
CANDIDA SPECIES: NEGATIVE
GARDNERELLA VAGINALIS: NEGATIVE
Trichomonas vaginosis: NEGATIVE

## 2017-05-07 LAB — URINALYSIS, MICROSCOPIC ONLY: BACTERIA UA: NONE SEEN

## 2017-05-07 LAB — URINE CULTURE

## 2017-05-07 NOTE — Telephone Encounter (Signed)
Spoke with patient and results. Patient is doing much better with the meds. She is very thankful.

## 2017-05-07 NOTE — Telephone Encounter (Signed)
-----   Message from Salvadore Dom, MD sent at 05/07/2017  1:04 PM EDT ----- Please inform the patient that her vaginitis panel is negative. Urinalysis isn't c/w infection (has casts, likely from diuretic treatment, nothing to worry about). Urine culture is pending. See is she is starting to feel any better with the steroid ointment and atarax.

## 2017-05-07 NOTE — Telephone Encounter (Signed)
Routed to triage in error. Rerouted to [place].

## 2017-05-07 NOTE — Telephone Encounter (Signed)
Patient returned call to Elaine. °

## 2017-05-07 NOTE — Telephone Encounter (Signed)
Left message to call back regarding lab results -eh

## 2017-05-08 ENCOUNTER — Telehealth: Payer: Self-pay

## 2017-05-08 NOTE — Telephone Encounter (Signed)
Spoke with patient. Results given. Patient verbalizes understanding. Encounter closed. 

## 2017-05-08 NOTE — Telephone Encounter (Signed)
-----   Message from Nunzio Cobbs, MD sent at 05/08/2017  9:35 AM EDT ----- This is Dr. Quincy Simmonds covering for her Secor.  Please report negative urine culture to patient.

## 2017-05-22 ENCOUNTER — Ambulatory Visit (INDEPENDENT_AMBULATORY_CARE_PROVIDER_SITE_OTHER): Payer: Managed Care, Other (non HMO) | Admitting: Obstetrics and Gynecology

## 2017-05-22 ENCOUNTER — Encounter: Payer: Self-pay | Admitting: Obstetrics and Gynecology

## 2017-05-22 ENCOUNTER — Other Ambulatory Visit: Payer: Self-pay

## 2017-05-22 VITALS — BP 102/60 | HR 64 | Resp 14 | Wt 182.0 lb

## 2017-05-22 DIAGNOSIS — N763 Subacute and chronic vulvitis: Secondary | ICD-10-CM | POA: Diagnosis not present

## 2017-05-22 DIAGNOSIS — N941 Unspecified dyspareunia: Secondary | ICD-10-CM | POA: Diagnosis not present

## 2017-05-22 DIAGNOSIS — N952 Postmenopausal atrophic vaginitis: Secondary | ICD-10-CM

## 2017-05-22 MED ORDER — ESTRADIOL 10 MCG VA TABS: ORAL_TABLET | VAGINAL | 0 refills | Status: DC

## 2017-05-22 NOTE — Progress Notes (Signed)
GYNECOLOGY  VISIT   HPI: 62 y.o.   Married  Caucasian  female   [id number] with Patient's last menstrual period was 02/18/1995.   here for follow up on vulvitis and vaginal atrophy. She was seen for an annual exam a few weeks ago c/o a 3 month h/o vulvitis. She was noted to have severe vulvitis and was treated with steroid ointment. She felt better after 4 days of the steroid ointment, then switched to Vaseline. She is using baby soap on the genital region. This morning she felt slightly irritated and used the steroid. She had a negative urine culture and negative vaginitis panel 2 weeks ago.    She had the [place] treatment last year and thinks it caused burning and scarring at the opening of her vagina.  She does have mixed incontinence and dripping after voiding. She has been referred to urology  GYNECOLOGIC HISTORY: Patient's last menstrual period was 02/18/1995. Contraception:hysterectomy  Menopausal hormone therapy: none         OB History    Gravida  2   Para      Term      Preterm      AB  2   Living  0     SAB  2   TAB      Ectopic      Multiple      Live Births                 Patient Active Problem List   Diagnosis Date Noted  . Rectal bleeding 09/02/2013  . CAD (coronary artery disease) 07/04/2010  . Shoulder pain   . Bruises easily   . Joint pain   . MI (myocardial infarction) ([place])   . Hyperlipidemia     Past Medical History:  Diagnosis Date  . Arthritis   . Benign tumor of adrenal gland   . CAD (coronary artery disease)    Inferior MI 1996 tx with bare metal stent RCA. Repeat  anterior MI 2002 with LAD vasospasm. Repeat inferior MI 2003 with occlusion RCA with overlapping Cypher DES in RCA. Repeat cath 2003 with profound vasospasm LAd and Circumflex. Repeat cath 2007  with patent RCA and no disease in LAD or Circumflex.  . Depression   . Environmental allergies   . Family history of adverse reaction to anesthesia    mother had ? problem  with anesthesia - was alcoholic - "mind was never right again"  . GERD (gastroesophageal reflux disease)   . Headache    sinus headaches  . Heart murmur   . History of stomach ulcers    due to taking aspirin for headaches  . HTN (hypertension)   . Hyperlipidemia   . Myocardial infarction ([place])    times 4 "very minor" stents x2   . Osteopenia   . Osteoporosis   . Rotator cuff tear    left  . Sleep apnea    uses c pap  . Urinary incontinence     Past Surgical History:  Procedure Laterality Date  . ABDOMINAL HYSTERECTOMY    . BACK SURGERY    . LAPAROSCOPIC LYSIS OF ADHESIONS    . MYOMECTOMY     age 30  . PELVIC ABCESS DRAINAGE    . SHOULDER OPEN ROTATOR CUFF REPAIR Left 11/16/2014   Procedure: LEFT MINI OPEN ROTATOR CUFF REPAIR SHOULDER OPEN WITH SUBACROMIAL DECOMPRESSION;  Surgeon: Susa Day, MD;  Location: [facility];  Service: Orthopedics;  Laterality: Left;  Current Outpatient Medications  Medication Sig Dispense Refill  . Acetaminophen-Caffeine (EXCEDRIN ASPIRIN FREE PO) Take 3 tablets by mouth daily.    . calcium citrate-vitamin D (CITRACAL+D) 315-200 MG-UNIT per tablet Take 1 tablet by mouth 3 (three) times daily.    . clobetasol ointment (TEMOVATE) 0.05 % Apply a pea sized amount twice daily for 2 weeks 30 g 0  . clopidogrel (PLAVIX) 75 MG tablet Take 1 tablet (75 mg total) by mouth daily. 90 tablet 3  . diltiazem (CARDIZEM CD) 120 MG 24 hr capsule Take 1 capsule (120 mg total) by mouth daily. 90 capsule 3  . escitalopram (LEXAPRO) 10 MG tablet Take 1 tablet (10 mg total) by mouth daily. 90 tablet 3  . hydrochlorothiazide (HYDRODIURIL) 25 MG tablet Take 0.5 tablets (12.5 mg total) by mouth daily. 30 tablet 9  . hydrOXYzine (ATARAX/VISTARIL) 10 MG tablet Take 1 tablet (10 mg total) by mouth 3 (three) times daily as needed. 20 tablet 0  . lisinopril (PRINIVIL,ZESTRIL) 20 MG tablet Take 1 tablet (20 mg total) by mouth daily. 90 tablet 3  . metoprolol succinate  (TOPROL-XL) 25 MG 24 hr tablet Take 1 tablet (25 mg total) by mouth daily. Take with or immediately following a meal. 90 tablet 3  . Multiple Vitamins-Minerals (CENTRUM SILVER PO) Take 1 tablet by mouth at bedtime.    Marland Kitchen omeprazole (PRILOSEC) 20 MG capsule Take 1 capsule (20 mg total) by mouth daily. 90 capsule 3  . potassium chloride (K-DUR,KLOR-CON) 10 MEQ tablet Take 1 tablet (10 mEq total) by mouth 2 (two) times daily. 180 tablet 3  . Ranibizumab (LUCENTIS IO) Inject into the eye as directed. Eye injections once monthly- unsure of dose    . simvastatin (ZOCOR) 40 MG tablet Take 1 tablet (40 mg total) by mouth at bedtime. 90 tablet 3   No current facility-administered medications for this visit.      ALLERGIES: Other; Tomato; and Valium  Family History  Problem Relation Age of Onset  . Alcohol abuse Father   . Cancer Father        lung  . Lung cancer Father   . Alcohol abuse Mother   . Breast cancer Mother   . Breast cancer Maternal Aunt   . Breast cancer Maternal Grandmother   . Breast cancer Maternal Aunt   . Breast cancer Cousin   . Leukemia Sister     Social History   Socioeconomic History  . Marital status: Married    Spouse name: Not on file  . Number of children: 0  . Years of education: Not on file  . Highest education level: Not on file  Occupational History    Employer: [employer]  Social Needs  . Financial resource strain: Not on file  . Food insecurity:    Worry: Not on file    Inability: Not on file  . Transportation needs:    Medical: Not on file    Non-medical: Not on file  Tobacco Use  . Smoking status: Former Smoker    Packs/day: 1.00    Types: Cigarettes    Last attempt to quit: 11/14/2011    Years since quitting: 5.5  . Smokeless tobacco: Never Used  . Tobacco comment: currently using e-cigs  Substance and Sexual Activity  . Alcohol use: No  . Drug use: No  . Sexual activity: Not Currently    Partners: Male    Birth  control/protection: Surgical    Comment: TAH  Lifestyle  . Physical  activity:    Days per week: Not on file    Minutes per session: Not on file  . Stress: Not on file  Relationships  . Social connections:    Talks on phone: Not on file    Gets together: Not on file    Attends religious service: Not on file    Active member of club or organization: Not on file    Attends meetings of clubs or organizations: Not on file    Relationship status: Not on file  . Intimate partner violence:    Fear of current or ex partner: Not on file    Emotionally abused: Not on file    Physically abused: Not on file    Forced sexual activity: Not on file  Other Topics Concern  . Not on file  Social History Narrative  . Not on file    Review of Systems  Constitutional: Negative.   HENT: Positive for sinus pain.   Eyes: Negative.   Respiratory: Negative.   Cardiovascular: Negative.   Gastrointestinal: Positive for diarrhea.       Bloating   Genitourinary: Positive for dysuria, frequency and urgency.       Loss of urine with sneeze/cough Pain with intercourse   Musculoskeletal: Negative.   Skin: Negative.   Neurological: Negative.   Endo/Heme/Allergies: Negative.   Psychiatric/Behavioral: Negative.     PHYSICAL EXAMINATION:    BP 102/60 (BP Location: Right Arm, Patient Position: Sitting, Cuff Size: Normal)   Pulse 64   Resp 14   Wt 182 lb (82.6 kg)   LMP 02/18/1995   BMI 29.15 kg/m     General appearance: alert, cooperative and appears stated age  Pelvic: External genitalia: the erythema is markedly improved, now only noted just outside of the vagina. Still with whitening of the inner labia majora, the labia minora and clitoris. Mild agglutination, no fissures.               Urethra:  normal appearing urethra with no masses, tenderness or lesions              Bartholins and Skenes: normal                 Vagina: normal appearing vagina with normal color and discharge, no lesions               Cervix: no lesions              Bimanual Exam:  Uterus:  normal size, contour, position, consistency, mobility, non-tender              Adnexa: no mass, fullness, tenderness               Chaperone was present for exam.  ASSESSMENT Severe vulvitis, improving, symptoms much better with steroid ointment and Vaseline (following vulvar skin care instructions) Urinary incontinence, OAB symptoms, recent negative culture Dyspareunia Vaginal atrophy, we previously discussed that the only absolute contraindication to vaginal estrogen was an estrogen dependant tumor. She would like to try it Strong FH of breast cancer, has genetics appointment   PLAN Will continue with vulvar skin care, use the steroid ointment 1-2 x a week for the next month Will start vaginal estrogen She has an appointment with urology F/U in one month, call with any concerns   An After Visit Summary was printed and given to the patient.

## 2017-05-27 ENCOUNTER — Encounter: Payer: Self-pay | Admitting: Nurse Practitioner

## 2017-05-27 ENCOUNTER — Ambulatory Visit (INDEPENDENT_AMBULATORY_CARE_PROVIDER_SITE_OTHER): Payer: Managed Care, Other (non HMO) | Admitting: Nurse Practitioner

## 2017-05-27 VITALS — BP 120/80 | HR 64 | Ht 66.5 in | Wt 183.4 lb

## 2017-05-27 DIAGNOSIS — R002 Palpitations: Secondary | ICD-10-CM

## 2017-05-27 DIAGNOSIS — I2510 Atherosclerotic heart disease of native coronary artery without angina pectoris: Secondary | ICD-10-CM | POA: Diagnosis not present

## 2017-05-27 DIAGNOSIS — I10 Essential (primary) hypertension: Secondary | ICD-10-CM | POA: Diagnosis not present

## 2017-05-27 DIAGNOSIS — E7800 Pure hypercholesterolemia, unspecified: Secondary | ICD-10-CM | POA: Diagnosis not present

## 2017-05-27 NOTE — Patient Instructions (Addendum)
We will be checking the following labs today - BMET   Medication Instructions:    Continue with your current medicines. BUT  I am stopping HCTZ  I am stopping Potassium  STOP Sudafed/decongestants - ok to use plain claritin, zyrtec, allegra and flonase/nasocort sprays    Testing/Procedures To Be Arranged:  N/A  Follow-Up:   See me in 4 months    Other Special Instructions:   Eat a banana   Monitor your BP for me  Keep restricting your salt    If you need a refill on your cardiac medications before your next appointment, please call your pharmacy.   Call the [place] office at [phone] if you have any questions, problems or concerns.

## 2017-05-27 NOTE — Progress Notes (Signed)
CARDIOLOGY OFFICE NOTE  Date:  05/27/2017    Donna Bullock Date of Birth: 1955-04-03 Medical Record #[id number]  PCP:  Jani Gravel, MD  Cardiologist:  Aldona Bar    Chief Complaint  Patient presents with  . Coronary Artery Disease    Seen for Dr. [name]    History of Present Illness: Donna Bullock is a 62 y.o. female who presents today for a 3 month check. Seen for Dr. [name]. She is a former patient of Dr. Susa Simmonds that I previously cared for.   She has a history of CAD, HLD, HTN, and tobacco abuse. Her CAD dates back to 1996 when she had a stent placed in her RCA. She had an anterior MI with cardiac arrest (VFib) in 2002. Cath showed a 75% distal LAD narrowing and this was managed conservatively with relook cath two days later showing 50% distal stenosis. She then had an inferior MI in 2003 with placement of overlapping Cypher drug eluting stents in the RCA. The LAD and Circumflex had mild disease per report. Four days later, she had severe chest pain, cath showed severe spasm of the LAD and Circumflex which resolved with IC vasodilators. Her last cath was in 2007 and showed patency of stents in the RCA and no other obstructive disease in the LAD and Circumflex. Her last stress test was in November 2009 and showed normal LVEF with no evidence of ischemia. She was seen in the ED 09/02/13 with bright red blood per rectum. Her ASA and Plavix were held. Colonoscopy 09/09/13 per Dr. Collene Mares with polyps removed but no other bleeding source identified. Aspirin was stopped but she has continued to use Excedrin Migraine.   Her other issues include RA, HLD, HTN and past tobacco abuse along with depression/anxiety.  I had not seen her in 5 1/2 years until August of 2016. She had since gotten [place] who I also see. Stopped smoking. Some vague symptoms noted - did not want to have stress testing but then needed shoulder surgery and I got her Myoview updated -  this turned out ok.   I have followed her since then - she has basically done ok. Had more palpitations over the past 6 months - we have gotten a heart monitor. She has had her echo updated. Myoview was from 2016. Ended up sending her to see Dr. Lovena Le for EP. Reassurance given.   Last seen by me back in January - she was doing ok. [name] was having more issues with his back and she was providing more care for him. Neither exercising due to his back issues.   Comes in today. Here with [name] - I am seeing him today as well. She feels like she is doing well from our standpoint. BP is great here and even lower at home. Not dizzy. Some fatigue - this is chronic. No chest pain. She admits she will take lots of rest periods. She has been taking care of [name] and has not been going to the gym. Some bloating - she wants to try and come off potassium. She is really not having any more palpitation - trying to use less Excedrin Migraine. She has been using sudafed and I cautioned her about this.   Past Medical History:  Diagnosis Date  . Arthritis   . Benign tumor of adrenal gland   . CAD (coronary artery disease)    Inferior MI 1996 tx with bare metal stent RCA. Repeat  anterior  MI 2002 with LAD vasospasm. Repeat inferior MI 2003 with occlusion RCA with overlapping Cypher DES in RCA. Repeat cath 2003 with profound vasospasm LAd and Circumflex. Repeat cath 2007  with patent RCA and no disease in LAD or Circumflex.  . Depression   . Environmental allergies   . Family history of adverse reaction to anesthesia    mother had ? problem with anesthesia - was alcoholic - "mind was never right again"  . GERD (gastroesophageal reflux disease)   . Headache    sinus headaches  . Heart murmur   . History of stomach ulcers    due to taking aspirin for headaches  . HTN (hypertension)   . Hyperlipidemia   . Myocardial infarction ([place])    times 4 "very minor" stents x2   . Osteopenia   . Osteoporosis   . Rotator  cuff tear    left  . Sleep apnea    uses c pap  . Urinary incontinence     Past Surgical History:  Procedure Laterality Date  . ABDOMINAL HYSTERECTOMY    . BACK SURGERY    . LAPAROSCOPIC LYSIS OF ADHESIONS    . MYOMECTOMY     age 68  . PELVIC ABCESS DRAINAGE    . SHOULDER OPEN ROTATOR CUFF REPAIR Left 11/16/2014   Procedure: LEFT MINI OPEN ROTATOR CUFF REPAIR SHOULDER OPEN WITH SUBACROMIAL DECOMPRESSION;  Surgeon: Susa Day, MD;  Location: [facility];  Service: Orthopedics;  Laterality: Left;     Medications: Current Meds  Medication Sig  . Acetaminophen-Caffeine (EXCEDRIN ASPIRIN FREE PO) Take 2 tablets by mouth daily.   . calcium citrate-vitamin D (CITRACAL+D) 315-200 MG-UNIT per tablet Take 1 tablet by mouth 3 (three) times daily.  . clobetasol ointment (TEMOVATE) 0.05 % Apply a pea sized amount twice daily for 2 weeks  . clopidogrel (PLAVIX) 75 MG tablet Take 1 tablet (75 mg total) by mouth daily.  Marland Kitchen diltiazem (CARDIZEM CD) 120 MG 24 hr capsule Take 1 capsule (120 mg total) by mouth daily.  Marland Kitchen escitalopram (LEXAPRO) 10 MG tablet Take 1 tablet (10 mg total) by mouth daily.  . Estradiol 10 MCG TABS vaginal tablet 1 tablet vaginally every night for one week, then 2 x a week at hs  . lisinopril (PRINIVIL,ZESTRIL) 20 MG tablet Take 1 tablet (20 mg total) by mouth daily.  . Multiple Vitamins-Minerals (CENTRUM SILVER PO) Take 1 tablet by mouth at bedtime.  Marland Kitchen omeprazole (PRILOSEC) 20 MG capsule Take 1 capsule (20 mg total) by mouth daily.  . Ranibizumab (LUCENTIS IO) Inject into the eye as directed. Eye injections once monthly- unsure of dose  . simvastatin (ZOCOR) 40 MG tablet Take 1 tablet (40 mg total) by mouth at bedtime.  . [DISCONTINUED] hydrochlorothiazide (HYDRODIURIL) 25 MG tablet Take 0.5 tablets (12.5 mg total) by mouth daily.  . [DISCONTINUED] potassium chloride (K-DUR,KLOR-CON) 10 MEQ tablet Take 1 tablet (10 mEq total) by mouth 2 (two) times daily.  . [DISCONTINUED]  pseudoephedrine (SUDAFED) 120 MG 12 hr tablet Take 120 mg by mouth every other day.     Allergies: Allergies  Allergen Reactions  . Other Hives and Diarrhea    Peppers   . Tomato Diarrhea  . Valium Other (See Comments)    Affects her behavioral    Social History: The patient  reports that she quit smoking about 5 years ago. Her smoking use included cigarettes. She smoked 1.00 pack per day. She has never used smokeless tobacco. She reports that she does  not drink alcohol or use drugs.   Family History: The patient's family history includes Alcohol abuse in her father and mother; Breast cancer in her cousin, maternal aunt, maternal aunt, maternal grandmother, and mother; Cancer in her father; Leukemia in her sister; Lung cancer in her father.   Review of Systems: Please see the history of present illness.   Otherwise, the review of systems is positive for none.   All other systems are reviewed and negative.   Physical Exam: VS:  BP 120/80 (BP Location: Left Arm, Patient Position: Sitting, Cuff Size: Normal)   Pulse 64   Ht 5' 6.5" (1.689 m)   Wt 183 lb 6.4 oz (83.2 kg)   LMP 02/18/1995   SpO2 96% Comment: at rest  BMI 29.16 kg/m  .  BMI Body mass index is 29.16 kg/m.  Wt Readings from Last 3 Encounters:  05/27/17 183 lb 6.4 oz (83.2 kg)  05/22/17 182 lb (82.6 kg)  05/06/17 182 lb (82.6 kg)    General: Pleasant. Well developed, well nourished and in no acute distress.   HEENT: Normal.  Neck: Supple, no JVD, carotid bruits, or masses noted.  Cardiac: Regular rate and rhythm. No murmurs, rubs, or gallops. No edema.  Respiratory:  Lungs are clear to auscultation bilaterally with normal work of breathing.  GI: Soft and nontender.  MS: No deformity or atrophy. Gait and ROM intact.  Skin: Warm and dry. Color is normal.  Neuro:  Strength and sensation are intact and no gross focal deficits noted.  Psych: Alert, appropriate and with normal affect.   LABORATORY DATA:  EKG:   EKG is not ordered today.  Lab Results  Component Value Date   WBC 6.9 01/30/2016   HGB 14.0 01/30/2016   HCT 42.8 01/30/2016   PLT 271 01/30/2016   GLUCOSE 105 (H) 03/20/2017   CHOL 145 03/20/2017   TRIG 184 (H) 03/20/2017   HDL 50 03/20/2017   LDLCALC 58 03/20/2017   ALT 17 03/20/2017   AST 20 03/20/2017   NA 142 03/20/2017   K 3.8 03/20/2017   CL 102 03/20/2017   CREATININE 0.83 03/20/2017   BUN 13 03/20/2017   CO2 22 03/20/2017   TSH 1.190 03/09/2017   INR 0.90 09/02/2013     BNP (last 3 results) No results for input(s): BNP in the last 8760 hours.  ProBNP (last 3 results) No results for input(s): PROBNP in the last 8760 hours.   Other Studies Reviewed Today:  Echo Study Conclusions 09/2016  - Left ventricle: The cavity size was mildly dilated. Systolic function was normal. The estimated ejection fraction was in the range of 55% to 60%. Wall motion was normal; there were no regional wall motion abnormalities. Features are consistent with a pseudonormal left ventricular filling pattern, with concomitant abnormal relaxation and increased filling pressure (grade 2 diastolic dysfunction). Doppler parameters are consistent with high ventricular filling pressure. - Aortic valve: There was mild regurgitation. - Mitral valve: There was trivial regurgitation.   48 HR HolterStudy Highlights6/2018  Sinus rhythm with sinus bradycardia. Lowest heart rate 49 bpm at 4am.  Frequent premature ventricular contractions (760 during monitoring period) with several episodes of bigeminy.  Rare premature atrial contractions         Myoview Study Highlights from 10/2014    Nuclear stress EF: 47%.  The left ventricular ejection fraction is mildly decreased (45-54%).  There was no ST segment deviation noted during stress.  This is a low risk study.  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    Assessment/Plan:  1.Palpitations -she has had PVCs with bigeminy - Echo stable. GXT was stable. She has seen EP - symptoms have improved with cutting back on caffeine. Needs to stop the sudaphed.   2.CAD: Stable. No chest pains.  She is known to have coronary vasospasm. Continues on Plavix given overlapping first generation DES. No longer on aspirin due to prior GI bleeding but does take excessive amount of Excedrin Migraine fairly regularly despite recommendations to not do this. Last Myoview was stable but with mild LV dysfunction  Improved by last echo.  Overall, she feels like she is doing well. No changes made today. Continue with CV risk factor modification.   3.HTN:BP is great - even lower at home - will stop her low dose HCTZ and the potassium. She is doing better with salt restriction. Lab today. She will continue to monitor her BP for me.    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   6. Mild LV dysfunction - she is on ACE andnow on higher dose ofbeta blockerdue to palpitations.Most recent echo improved - continue with current regimen.   Current medicines are reviewed with the patient today.  The patient does not have concerns regarding medicines other than what has been noted above.  The following changes have been made:  See above.  Labs/ tests ordered today include:    Orders Placed This Encounter  Procedures  . Basic metabolic panel     Disposition:   FU with me in 4 months.   Patient is agreeable to this plan and will call if any problems develop in the interim.   SignedTruitt Merle, NP  05/27/2017 2:28 PM  [facility] [address] [place], [place]  [address] Phone: [phone] Fax: [phone]

## 2017-05-28 ENCOUNTER — Inpatient Hospital Stay: Payer: Managed Care, Other (non HMO) | Attending: Genetic Counselor | Admitting: Genetics

## 2017-05-28 ENCOUNTER — Encounter: Payer: Self-pay | Admitting: Genetics

## 2017-05-28 ENCOUNTER — Inpatient Hospital Stay: Payer: Managed Care, Other (non HMO)

## 2017-05-28 DIAGNOSIS — Z803 Family history of malignant neoplasm of breast: Secondary | ICD-10-CM

## 2017-05-28 DIAGNOSIS — Z800 Family history of malignant neoplasm of digestive organs: Secondary | ICD-10-CM

## 2017-05-28 DIAGNOSIS — Z7183 Encounter for nonprocreative genetic counseling: Secondary | ICD-10-CM | POA: Diagnosis not present

## 2017-05-28 DIAGNOSIS — Z806 Family history of leukemia: Secondary | ICD-10-CM | POA: Diagnosis not present

## 2017-05-28 LAB — BASIC METABOLIC PANEL
BUN/Creatinine Ratio: 16 (ref 12–28)
BUN: 16 mg/dL (ref 8–27)
CO2: 27 mmol/L (ref 20–29)
Calcium: 10.2 mg/dL (ref 8.7–10.3)
Chloride: 102 mmol/L (ref 96–106)
Creatinine, Ser: 1.01 mg/dL — ABNORMAL HIGH (ref 0.57–1.00)
GFR calc Af Amer: 68 mL/min/{1.73_m2} (ref >59)
GFR calc non Af Amer: 59 mL/min/{1.73_m2} — ABNORMAL LOW (ref >59)
Glucose: 92 mg/dL (ref 65–99)
Potassium: 3.8 mmol/L (ref 3.5–5.2)
Sodium: 144 mmol/L (ref 134–144)

## 2017-05-28 NOTE — Progress Notes (Signed)
REFERRING PROVIDER: Salvadore Bullock, [address] [place], [place] [address]  PRIMARY PROVIDER:  Jani Gravel, MD  PRIMARY REASON FOR VISIT:  1. Family history of breast cancer   2. Family history of leukemia   3. Family history of stomach cancer     HISTORY OF PRESENT ILLNESS:   Donna Bullock, a 62 y.o. female, was seen for a [place] cancer genetics consultation at the request of Donna Bullock due to a family history of cancer.  Donna Bullock presents to clinic today to discuss the possibility of a hereditary predisposition to cancer, genetic testing, and to further clarify her future cancer risks, as well as potential cancer risks for family members.   Donna Bullock is a 62 y.o. female with no personal history of cancer.    HORMONAL RISK FACTORS:  Menarche was at age 56.  First live birth at age N/A 2 miscarriages.  Ovaries intact: no.  Hysterectomy: yes.  Menopausal status: postmenopausal.  HRT use: 4 months Colonoscopy: yes; reports a few polyps were removed- had some other GI issue that they could not diagnose. Mammogram within the last year: yes. Number of breast biopsies: 0.  Past Medical History:  Diagnosis Date  . Arthritis   . Benign tumor of adrenal gland   . CAD (coronary artery disease)    Inferior MI 1996 tx with bare metal stent RCA. Repeat  anterior MI 2002 with LAD vasospasm. Repeat inferior MI 2003 with occlusion RCA with overlapping Cypher DES in RCA. Repeat cath 2003 with profound vasospasm LAd and Circumflex. Repeat cath 2007  with patent RCA and no disease in LAD or Circumflex.  . Depression   . Environmental allergies   . Family history of adverse reaction to anesthesia    mother had ? problem with anesthesia - was alcoholic - "mind was never right again"  . Family history of breast cancer   . Family history of leukemia   . Family history of stomach cancer   . GERD (gastroesophageal reflux disease)   . Headache    sinus headaches  . Heart  murmur   . History of stomach ulcers    due to taking aspirin for headaches  . HTN (hypertension)   . Hyperlipidemia   . Myocardial infarction ([place])    times 4 "very minor" stents x2   . Osteopenia   . Osteoporosis   . Rotator cuff tear    left  . Sleep apnea    uses c pap  . Urinary incontinence     Past Surgical History:  Procedure Laterality Date  . ABDOMINAL HYSTERECTOMY    . BACK SURGERY    . LAPAROSCOPIC LYSIS OF ADHESIONS    . MYOMECTOMY     age 70  . PELVIC ABCESS DRAINAGE    . SHOULDER OPEN ROTATOR CUFF REPAIR Left 11/16/2014   Procedure: LEFT MINI OPEN ROTATOR CUFF REPAIR SHOULDER OPEN WITH SUBACROMIAL DECOMPRESSION;  Surgeon: Susa Day, MD;  Location: [facility];  Service: Orthopedics;  Laterality: Left;    Social History   Socioeconomic History  . Marital status: Married    Spouse name: Not on file  . Number of children: 0  . Years of education: Not on file  . Highest education level: Not on file  Occupational History    Employer: [employer]  Social Needs  . Financial resource strain: Not on file  . Food insecurity:    Worry: Not on file    Inability: Not  on file  . Transportation needs:    Medical: Not on file    Non-medical: Not on file  Tobacco Use  . Smoking status: Former Smoker    Packs/day: 1.00    Types: Cigarettes    Last attempt to quit: 11/14/2011    Years since quitting: 5.5  . Smokeless tobacco: Never Used  . Tobacco comment: currently using e-cigs  Substance and Sexual Activity  . Alcohol use: No  . Drug use: No  . Sexual activity: Not Currently    Partners: Male    Birth control/protection: Surgical    Comment: TAH  Lifestyle  . Physical activity:    Days per week: Not on file    Minutes per session: Not on file  . Stress: Not on file  Relationships  . Social connections:    Talks on phone: Not on file    Gets together: Not on file    Attends religious service: Not on file    Active member of club or organization:  Not on file    Attends meetings of clubs or organizations: Not on file    Relationship status: Not on file  Other Topics Concern  . Not on file  Social History Narrative  . Not on file     FAMILY HISTORY:  We obtained a detailed, 4-generation family history.  Significant diagnoses are listed below: Family History  Problem Relation Age of Onset  . Alcohol abuse Father   . Lung cancer Father   . Cancer - Lung Father   . Alcohol abuse Mother   . Breast cancer Mother 39  . Breast cancer Maternal Aunt 65  . Breast cancer Maternal Grandmother 34  . Breast cancer Maternal Aunt 72  . Breast cancer Cousin 46  . Leukemia Sister 70  . Stomach cancer Paternal Aunt 32  . Cancer Cousin 20       type unk   Donna Bullock has no children, she reports she had 2 miscarriages.  Donna Bullock had 1 sister who died at 86 due to leukemia.  She had no children.    Donna Bullock father died at 73 due to lung cancer.  Donna Bullock had a paternal aunt who had stomach cancer in her 44's.  Donna Bullock has 4 paternal uncles with no history of cancer.   No paternal cousins with any history of cancer, but she has limited information about these relatives.  Donna Bullock paternal grandfather died at 64 due to appendicitis.  Donna Bullock paternal grandmother died at 57 with no history of cancer.    Donna Bullock mother had breast cancer in her 104's- she had her uterus and ovaries intact.  Donna Bullock mother had an identical twin sister who developed breast cacner at 24- she had her uterus and ovaries intact.  Donna Bullock has another maternal aunt who had breast cancer at 58.  This aunt had a daughter who had breast cancer in her 81's- she had genetic testing >10 years ago that was negative (BRCA1/2 only).  Donna Bullock maternal grandfather died in his 81's of diverticulitis/GI issues, and her maternal grandmother died at 13 due to breast cancer.  This grandmother's sister also had breast cancer-age dx unk.   Patient's maternal ancestors  are of [place] descent, and paternal ancestors are of English descent. There is no reported Ashkenazi Jewish ancestry. There is no known consanguinity.  GENETIC COUNSELING ASSESSMENT: Donna Bullock is a 62 y.o. female with a family history which is somewhat  suggestive of a Hereditary Cancer Predisposition Syndrome. We, therefore, discussed and recommended the following at today's visit.   DISCUSSION: We reviewed the characteristics, features and inheritance patterns of hereditary cancer syndromes. We also discussed genetic testing, including the appropriate family members to test, the process of testing, insurance coverage and turn-around-time for results. We discussed the implications of a negative, positive and/or variant of uncertain significant result. We recommended Donna Bullock pursue genetic testing for the CancerNext gene panel.   The CancerNext gene panel offered by [facility] includes sequencing and rearrangement analysis for the following 34 genes:   APC, ATM, BARD1, BMPR1A, BRCA1, BRCA2, BRIP1, BMPR1A, CDH1, CDK4, CDKN2A, CHEK2, DICER1, EPCAM, GREM1, HOXB13,  MLH1, MRE11A, MSH2, MSH6, MUTYH, NBN, NF1, PALB2, PMS2, POLD1, POLE, PTEN, RAD50, RAD51C,RAD51D, SMAD4, SMARCA4, STK11, and TP53.   We discussed that only 5-10% of cancers are associated with a Hereditary cancer predisposition syndrome.  One of the most common hereditary cancer syndromes that increases breast cancer risk is called Hereditary Breast and Ovarian Cancer (HBOC) syndrome.  This syndrome is caused by mutations in the BRCA1 and BRCA2 genes.  This syndrome increases an individual's lifetime risk to develop breast, ovarian, pancreatic, and other types of cancer.  There are also many other cancer predisposition syndromes caused by mutations in several other genes.  We discussed that if she is found to have a mutation in one of these genes, it may impact future medical management recommendations such as increased cancer  screenings and consideration of risk reducing surgeries.  A positive result could also have implications for the patient's family members.  A Negative result would mean we were unable to identify a hereditary component to her cancer, but does not rule out the possibility of a hereditary basis for her family's cancer.  There could be mutations that are undetectable by current technology, or in genes not yet tested or identified to increase cancer risk.    We discussed the potential to find a Variant of Uncertain Significance or VUS.  These are variants that have not yet been identified as pathogenic or benign, and it is unknown if this variant is associated with increased cancer risk or if this is a normal finding.  Most VUS's are reclassified to benign or likely benign.   It should not be used to make medical management decisions. With time, we suspect the lab will determine the significance of any VUS's identified if any.   Based on Donna Bullock's family history of cancer, she meets medical criteria for genetic testing. Despite that she meets criteria, she may still have an out of pocket cost. We discussed that if her out of pocket cost for testing is over $100, the laboratory will call and confirm whether she wants to proceed with testing.  If the out of pocket cost of testing is less than $100 she will be billed by the genetic testing laboratory.   Based on the patient's personal and family history, the statistical model (Tyrer Cusik)   Was used to estimate her risk of developing breast cancer. This estimates her lifetime risk of developing breast cancer to be approximately 14.6%. This estimation is in the setting of negative genetic testing.  A positive result may impact this risk assessment.  The patient's lifetime breast cancer risk is a preliminary estimate based on available information using one of several models endorsed by the [place] (ACS). The ACS recommends consideration of breast  MRI screening as an adjunct to mammography for patients at  high risk (defined as 20% or greater lifetime risk). A more detailed breast cancer risk assessment can be considered, if clinically indicated.      We discussed that some people do not want to undergo genetic testing due to fear of genetic discrimination.  A federal law called the Genetic Information Non-Discrimination Act (GINA) of 2008 helps protect individuals against genetic discrimination based on their genetic test results.  It impacts both health insurance and employment.  For health insurance, it protects against increased premiums, being kicked off insurance or being forced to take a test in order to be insured.  For employment it protects against hiring, firing and promoting decisions based on genetic test results.  Health status due to a cancer diagnosis is not protected under GINA.  This law does not protect life insurance, disability insurance, or other types of insurance.   PLAN: After considering the risks, benefits, and limitations, Donna Bullock  provided informed consent to pursue genetic testing and the blood sample was sent to [facility] for analysis of the CancerNext Panel. Results should be available within approximately 2-3 weeks' time, at which point they will be disclosed by telephone to Ms. Buda, as will any additional recommendations warranted by these results. Ms. Gassmann will receive a summary of her genetic counseling visit and a copy of her results once available. This information will also be available in Epic. We encouraged Ms. Fazzino to remain in contact with cancer genetics annually so that we can continuously update the family history and inform her of any changes in cancer genetics and testing that may be of benefit for her family. Ms. Birkeland questions were answered to her satisfaction today. Our contact information was provided should additional questions or concerns arise.  Based on Ms. Moose's family  history, we recommended her maternal relatives, have genetic counseling and testing.  Her sister is recommended to have updated genetic testing.   Ms. Lampkins will let us know if we can be of any assistance in coordinating genetic counseling and/or testing for this family member.   Lastly, we encouraged Ms. Abraha to remain in contact with cancer genetics annually so that we can continuously update the family history and inform her of any changes in cancer genetics and testing that may be of benefit for this family.   Ms.  Mcmurry questions were answered to her satisfaction today. Our contact information was provided should additional questions or concerns arise. Thank you for the referral and allowing [place] to share in the care of your patient.   Tana Felts, MS, [facility] Certified Genetic Counselor [email or web address] phone: [phone]  The patient was seen for a total of 40 minutes in face-to-face genetic counseling.   This patient was discussed with Drs. Magrinat, Lindi Adie and/or Burr Medico who agrees with the above.

## 2017-06-12 ENCOUNTER — Telehealth: Payer: Self-pay | Admitting: Genetics

## 2017-06-12 NOTE — Telephone Encounter (Signed)
Revealed negative genetic testing.     This normal result is reassuring and indicates that it is unlikely Donna Bullock has a hereditary predisposition to cancer. It is unlikely that there is an increased risk of another cancer due to a mutation in one of these genes.    We discussed her breast cancer risk estimated by Donna Bullock breast cancer risk model was 14.6%.  While this is higher than for other women her age, it is not above 20%, therefore high risk breast screening is not indicated by this risk model.     We recommended any maternal relatives also have genetic testing as there could still be a hereditary cause for the cacner in Donna Bullock's family that she did not inherit and therefore was not identified in her.  (her maternal cousin who had genetic testing 10 years ago also is recommended to get updated testing).   It will be important for her to keep in contact with genetics to learn if any additional testing may be needed in the future.

## 2017-06-18 ENCOUNTER — Other Ambulatory Visit: Payer: Self-pay | Admitting: Obstetrics and Gynecology

## 2017-06-18 NOTE — Telephone Encounter (Signed)
Medication refill request: [facility]  Last AEX:  05-06-17  Next OV: 07-02-17 Next AEX: Not scheduled yet Last MMG (if hormonal medication request): 01-15-17  Refill authorized: please advise

## 2017-06-19 ENCOUNTER — Encounter: Payer: Self-pay | Admitting: Genetics

## 2017-06-19 ENCOUNTER — Ambulatory Visit: Payer: Self-pay | Admitting: Genetics

## 2017-06-19 DIAGNOSIS — Z800 Family history of malignant neoplasm of digestive organs: Secondary | ICD-10-CM

## 2017-06-19 DIAGNOSIS — Z1379 Encounter for other screening for genetic and chromosomal anomalies: Secondary | ICD-10-CM

## 2017-06-19 DIAGNOSIS — Z806 Family history of leukemia: Secondary | ICD-10-CM

## 2017-06-19 DIAGNOSIS — Z803 Family history of malignant neoplasm of breast: Secondary | ICD-10-CM

## 2017-06-19 NOTE — Progress Notes (Signed)
HPI:  Donna Bullock was previously seen in the [facility] clinic on 05/28/2017 due to a family history of cancer and concerns regarding a hereditary predisposition to cancer. Please refer to our prior cancer genetics clinic note for more information regarding Donna Bullock's medical, social and family histories, and our assessment and recommendations, at the time. Donna Bullock recent genetic test results were disclosed to her, as well as recommendations warranted by these results. These results and recommendations are discussed in more detail below.  CANCER HISTORY:   No history exists.     FAMILY HISTORY:  We obtained a detailed, 4-generation family history.  Significant diagnoses are listed below: Family History  Problem Relation Age of Onset  . Alcohol abuse Father   . Lung cancer Father   . Cancer - Lung Father   . Alcohol abuse Mother   . Breast cancer Mother 40  . Breast cancer Maternal Aunt 65  . Breast cancer Maternal Grandmother 34  . Breast cancer Maternal Aunt 72  . Breast cancer Cousin 85  . Leukemia Sister 68  . Stomach cancer Paternal Aunt 69  . Cancer Cousin 50       type unk    Donna Bullock has no children, she reports she had 2 miscarriages.  Donna Bullock had 1 sister who died at 52 due to leukemia.  She had no children.    Donna Bullock father died at 15 due to lung cancer.  Donna Bullock had a paternal aunt who had stomach cancer in her 52's.  Donna Bullock has 4 paternal uncles with no history of cancer.   No paternal cousins with any history of cancer, but she has limited information about these relatives.  Donna Bullock paternal grandfather died at 86 due to appendicitis.  Donna Bullock paternal grandmother died at 71 with no history of cancer.    Donna Bullock mother had breast cancer in her 70's- she had her uterus and ovaries intact.  Donna Bullock mother had an identical twin sister who developed breast cacner at 80- she had her uterus and ovaries intact.  Donna Bullock has  another maternal aunt who had breast cancer at 88.  This aunt had a daughter who had breast cancer in her 57's- she had genetic testing >10 years ago that was negative (BRCA1/2 only).  Donna Bullock maternal grandfather died in his 73's of diverticulitis/GI issues, and her maternal grandmother died at 84 due to breast cancer.  This grandmother's sister also had breast cancer-age dx unk.   Patient's maternal ancestors are of [place] descent, and paternal ancestors are of English descent. There is no reported Ashkenazi Jewish ancestry. There is no known consanguinity.  GENETIC TEST RESULTS: Genetic testing performed through [facility] CancerNext Panel reported out on 06/10/2017 showed no pathogenic mutations. Genes Analyzed (34 total): APC, ATM, BARD1, BMPR1A, BRCA1, BRCA2, BRIP1, CDH1, CDK4, CDKN2A, CHEK2, DICER1, HOXB13, MLH1, MRE11A, MSH2, MSH6, MUTYH, NBN, NF1, PALB2, PMS2, POLD1, POLE, PTEN, RAD50, RAD51C, RAD51D, SMAD4, SMARCA4, STK11 and TP53 (sequencing and deletion/duplication); EPCAM and GREM1 (deletion/duplication only).  The test report will be scanned into EPIC and will be located under the Molecular Pathology section of the Results Review tab. A portion of the result report is included below for reference.     We discussed with Donna Bullock that because current genetic testing is not perfect, it is possible there may be a gene mutation in one of these genes that current testing cannot detect, but that chance is small.  We also discussed, that there could be another gene that has not yet been discovered, or that we have not yet tested, that is responsible for the cancer diagnoses in the family. It is also possible there is a hereditary cause for the cancer in the family that Donna Bullock did not inherit and therefore was not identified in her testing.  Therefore, it is important to remain in touch with cancer genetics in the future so that we can continue to offer Donna Bullock the most up to date genetic  testing.   ADDITIONAL GENETIC TESTING: We discussed with Donna Bullock that there are other genes that are associated with increased cancer risk that can be analyzed. The laboratories that offer this testing look at these additional genes via a hereditary cancer gene panel. Should Ms. Scarola wish to pursue additional genetic testing, we are happy to discuss and coordinate this testing, at any time.    CANCER SCREENING RECOMMENDATIONS: Ms. Schemm test result is considered negative (normal).  This means that we have not identified a hereditary cause for her family history of cancer at this time.   While reassuring, this does not definitively rule out a hereditary predisposition to cancer. It is still possible that there could be genetic mutations that are undetectable by current technology, or genetic mutations in genes that have not been tested or identified to increase cancer risk.  Therefore, it is recommended she continue to follow the cancer management and screening guidelines provided by her oncology and primary healthcare provider. An individual's cancer risk is not determined by genetic test results alone.  Overall cancer risk assessment includes additional factors such as personal medical history, family history, etc.  These should be used to make a personalized plan for cancer prevention and surveillance.    Based on the patient's personal and family history, the statistical model (Tyrer Cusik)   Was used to estimate her risk of developing breast cancer. This estimates her lifetime risk of developing breast cancer to be approximately 14.6%. This estimation is in the setting of negative genetic testing.  A positive result may impact this risk assessment.  The patient's lifetime breast cancer risk is a preliminary estimate based on available information using one of several models endorsed by the [place] (ACS). The ACS recommends consideration of breast MRI screening as an adjunct to  mammography for patients at high risk (defined as 20% or greater lifetime risk). A more detailed breast cancer risk assessment can be considered, if clinically indicated.       RECOMMENDATIONS FOR FAMILY MEMBERS:  Relatives in this family might be at some increased risk of developing cancer, over the general population risk, simply due to the family history of cancer.  We recommended women in this family have a yearly mammogram beginning at age 80, or 54 years younger than the earliest onset of cancer, an annual clinical breast exam, and perform monthly breast self-exams. Women in this family should also have a gynecological exam as recommended by their primary provider. All family members should have a colonoscopy by age 12 (or as directed by their doctors).  All family members should inform their physicians about the family history of cancer so their doctors can make the most appropriate screening recommendations for them.   It is also possible there is a hereditary cause for the cancer in Ms. Mackins's family that she did not inherit and therefore was not identified in her.   We recommended her maternal relatives also have genetic  counseling and testing. Ms. Carelli will let us know if we can be of any assistance in coordinating genetic counseling and/or testing for these family members.   FOLLOW-UP: Lastly, we discussed with Ms. Guedes that cancer genetics is a rapidly advancing field and it is possible that new genetic tests will be appropriate for her and/or her family members in the future. We encouraged her to remain in contact with cancer genetics on an annual basis so we can update her personal and family histories and let her know of advances in cancer genetics that may benefit this family.   Our contact number was provided. Ms. Wayment questions were answered to her satisfaction, and she knows she is welcome to call us at anytime with additional questions or concerns.   Ferol Luz, MS,  [facility] Certified Genetic Counselor [email or web address]

## 2017-06-29 ENCOUNTER — Telehealth: Payer: Self-pay | Admitting: Obstetrics and Gynecology

## 2017-06-29 NOTE — Telephone Encounter (Signed)
Patient canceled her follow up appointment 07/02/17 and will call later to reschedule.

## 2017-07-02 ENCOUNTER — Ambulatory Visit: Payer: Managed Care, Other (non HMO) | Admitting: Obstetrics and Gynecology

## 2017-08-04 ENCOUNTER — Other Ambulatory Visit: Payer: Self-pay | Admitting: Nurse Practitioner

## 2017-09-16 ENCOUNTER — Other Ambulatory Visit: Payer: Self-pay | Admitting: Nurse Practitioner

## 2017-09-28 ENCOUNTER — Ambulatory Visit: Payer: Managed Care, Other (non HMO) | Admitting: Nurse Practitioner

## 2017-09-29 ENCOUNTER — Ambulatory Visit: Payer: Managed Care, Other (non HMO) | Admitting: Nurse Practitioner

## 2017-11-10 ENCOUNTER — Encounter: Payer: Self-pay | Admitting: Nurse Practitioner

## 2017-11-10 ENCOUNTER — Ambulatory Visit (INDEPENDENT_AMBULATORY_CARE_PROVIDER_SITE_OTHER): Payer: Managed Care, Other (non HMO) | Admitting: Nurse Practitioner

## 2017-11-10 ENCOUNTER — Encounter

## 2017-11-10 ENCOUNTER — Encounter (INDEPENDENT_AMBULATORY_CARE_PROVIDER_SITE_OTHER): Payer: Self-pay

## 2017-11-10 VITALS — BP 150/80 | HR 69 | Ht 67.0 in | Wt 180.8 lb

## 2017-11-10 DIAGNOSIS — I259 Chronic ischemic heart disease, unspecified: Secondary | ICD-10-CM

## 2017-11-10 MED ORDER — ISOSORBIDE MONONITRATE ER 30 MG PO TB24: 15.0000 mg | ORAL_TABLET | Freq: Every day | ORAL | 3 refills | Status: DC

## 2017-11-10 NOTE — Patient Instructions (Addendum)
We will be checking the following labs today - CMET and CBC   Medication Instructions:    Continue with your current medicines.   I am restarting the Isosorbide 30 mg take 1/2 a tablet daily - this was sent to your pharmacy.     Testing/Procedures To Be Arranged:  Cardiac catheterization  Follow-Up:   See me in about 2 to 3 weeks    Other Special Instructions:  Your provider has recommended a cardiac catherization  You are scheduled for a cardiac catheterization on Monday, September 30th at 7:30AM with Dr. Tamala Julian or associate.  Please arrive at the [facility] (Main Entrance) at [facility] at [address], [place] Stay on Monday, September 30th at 5:30AM.    Special note: Every effort is made to have your procedure done on time.   Please understand that emergencies sometimes delay a scheduled   procedure.  No food or drink after midnight on Sunday On the morning of your procedure, take your medcines   You may take your morning medications with a sip of water on the day of your procedure.  Please take a baby aspirin (81 mg) on the morning of your procedure.   Medications to [place] for a one night stay -- bring personal belongings.  Bring a current list of your medications and current insurance cards.  You MUST have a responsible person to drive you home. Someone MUST be with you the first 24 hours after you arrive home or your discharge will be delayed. Wear clothes that are easy to get on and off and wear slip on shoes.    Coronary Angiogram A coronary angiogram, also called coronary angiography, is an X-ray procedure used to look at the arteries in the heart. In this procedure, a dye (contrast dye) is injected through a long, hollow tube (catheter). The catheter is about the size of a piece of cooked spaghetti and is inserted through your groin, wrist, or arm. The dye is injected into each artery, and X-rays are then taken to  show if there is a blockage in the arteries of your heart.  LET [facility] CARE PROVIDER KNOW ABOUT: Any allergies you have, including allergies to shellfish or contrast dye.   All medicines you are taking, including vitamins, herbs, eye drops, creams, and over-the-counter medicines.   Previous problems you or members of your family have had with the use of anesthetics.   Any blood disorders you have.   Previous surgeries you have had. History of kidney problems or failure.   Other medical conditions you have.  RISKS AND COMPLICATIONS  Generally, a coronary angiogram is a safe procedure. However, about 1 person out of 1000 can have problems that may include: Allergic reaction to the dye. Bleeding/bruising from the access site or other locations. Kidney injury, especially in people with impaired kidney function.  Stroke (rare). Heart attack (rare). Irregular rhythms (rare) Death (rare)  BEFORE THE PROCEDURE  Do not eat or drink anything after midnight the night before the procedure or as directed by your health care provider.   Ask your health care provider about changing or stopping your regular medicines. This is especially important if you are taking diabetes medicines or blood thinners.  PROCEDURE You may be given a medicine to help you relax (sedative) before the procedure. This medicine is given through an intravenous (IV) access tube that is inserted into one of your veins.  The area where the catheter will be inserted will be washed and shaved. This is usually done in the groin but may be done in the fold of your arm (near your elbow) or in the wrist.    A medicine will be given to numb the area where the catheter will be inserted (local anesthetic).   The health care provider will insert the catheter into an artery. The catheter will be guided by using a special type of X-ray (fluoroscopy) of the blood vessel being examined.   A special dye will then be injected into the  catheter, and X-rays will be taken. The dye will help to show where any narrowing or blockages are located in the heart arteries.     AFTER THE PROCEDURE  If the procedure is done through the leg, you will be kept in bed lying flat for several hours. You will be instructed to not bend or cross your legs. The insertion site will be checked frequently.   The pulse in your feet or wrist will be checked frequently.   Additional blood tests, X-rays, and an electrocardiogram may be done.          If you need a refill on your cardiac medications before your next appointment, please call your pharmacy.   Call the [place] office at [phone] if you have any questions, problems or concerns.

## 2017-11-10 NOTE — H&P (View-Only) (Signed)
CARDIOLOGY OFFICE NOTE  Date:  11/10/2017    Donna [place] Date of Birth: [birth year] Medical Record #[id number]  PCP:  Jani Gravel, MD  Cardiologist:  Aldona Bar  Chief Complaint  Patient presents with  . Coronary Artery Disease    Follow up visit - seen for Dr. [name]    History of Present Illness: Donna [place] is a 62 y.o. female who presents today for a follow up visit. Seen for Dr. [name]. She is a former patient of Dr. Susa Simmonds that I previously cared for.   She has a history of CAD, HLD, HTN, and tobacco abuse. Her CAD dates back to 1996 when she had a stent placed in her RCA. She had an anterior MI with cardiac arrest (VFib) in 2002. Cath showed a 75% distal LAD narrowing and this was managed conservatively with relook cath two days later showing 50% distal stenosis. She then had an inferior MI in 2003 with placement of overlapping Cypher drug eluting stents in the RCA. The LAD and Circumflex had mild disease per report. Four days later, she had severe chest pain, cath showed severe spasm of the LAD and Circumflex which resolved with IC vasodilators. Her last cath was in 2007 and showed patency of stents in the RCA and no other obstructive disease in the LAD and Circumflex. Remote stress test was in November 2009 and showed normal LVEF with no evidence of ischemia. She was seen in the ED 09/02/13 with bright red blood per rectum. Her ASA and Plavix were held. Colonoscopy 09/09/13 per Dr. Collene Mares with polyps removed but no other bleeding source identified. Aspirin was stopped but she has continued to use Excedrin Migraine.   Her other issues include RA, HLD, HTN and past tobacco abuse along with depression/anxiety.  I had not seen her in 5 1/2 years until August of 2016. She had since gotten married -toMark Bullock who I also see. She had stopped smoking. Some vague symptoms noted - did not want to have stress testing but then needed shoulder surgery and I got  her Myoview updated - no ischemia but EF a little down - better by echo from 2018 - she has been managed medically since.   I have followed her since then - she has basically done ok. Had more palpitations and we have gotten a heart monitor.  Ended up sending her to see Dr. Lovena Le for EP. Reassurance given.  I saw her last in April - she was doing well. She was taking care of Donna following his back surgery and had not been going to the gym. Still using too much Excedrin and Sudafed. Cautioned about this once again.   Comes in today. Here alone today. She has not had a good few months. She has had profound fatigue - out of proportion for what she feels she should be having. She has been having some chest heaviness over the past few months - this would be new - and more so with physical exertion. They have been cleaning out a house to put on the market - this has involved doing lots of manual/physical labor. This has caused her to have chest heaviness as well. She has had this heaviness that at times has caused her concern. No NTG use. Her symptoms seem to be progressive as well and now are worrisome. She could not get her Imdur refilled - has been out of for several weeks - but was having this even prior -  hard to say if this has helped or not.   Her prior anginal equivalent has not always been typical - she presented once with a VF arrest. Her vasospasm was more of a choking sensation.  Past Medical History:  Diagnosis Date  . Arthritis   . Benign tumor of adrenal gland   . CAD (coronary artery disease)    Inferior MI 1996 tx with bare metal stent RCA. Repeat  anterior MI 2002 with LAD vasospasm. Repeat inferior MI 2003 with occlusion RCA with overlapping Cypher DES in RCA. Repeat cath 2003 with profound vasospasm LAd and Circumflex. Repeat cath 2007  with patent RCA and no disease in LAD or Circumflex.  . Depression   . Environmental allergies   . Family history of adverse reaction to  anesthesia    mother had ? problem with anesthesia - was alcoholic - "mind was never right again"  . Family history of breast cancer   . Family history of leukemia   . Family history of stomach cancer   . GERD (gastroesophageal reflux disease)   . Headache    sinus headaches  . Heart murmur   . History of stomach ulcers    due to taking aspirin for headaches  . HTN (hypertension)   . Hyperlipidemia   . Myocardial infarction ([place])    times 4 "very minor" stents x2   . Osteopenia   . Osteoporosis   . Rotator cuff tear    left  . Sleep apnea    uses c pap  . Urinary incontinence     Past Surgical History:  Procedure Laterality Date  . ABDOMINAL HYSTERECTOMY    . BACK SURGERY    . LAPAROSCOPIC LYSIS OF ADHESIONS    . MYOMECTOMY     age 1  . PELVIC ABCESS DRAINAGE    . SHOULDER OPEN ROTATOR CUFF REPAIR Left 11/16/2014   Procedure: LEFT MINI OPEN ROTATOR CUFF REPAIR SHOULDER OPEN WITH SUBACROMIAL DECOMPRESSION;  Surgeon: Susa Day, MD;  Location: [facility];  Service: Orthopedics;  Laterality: Left;     Medications: Current Meds  Medication Sig  . Acetaminophen-Caffeine (EXCEDRIN ASPIRIN FREE PO) Take 2 tablets by mouth daily.   . calcium citrate-vitamin D (CITRACAL+D) 315-200 MG-UNIT per tablet Take 1 tablet by mouth 3 (three) times daily.  . clobetasol ointment (TEMOVATE) 0.05 % Apply a pea sized amount twice daily for 2 weeks  . clopidogrel (PLAVIX) 75 MG tablet TAKE 1 TABLET BY MOUTH EVERY DAY  . diltiazem (CARDIZEM CD) 120 MG 24 hr capsule TAKE 1 CAPSULE BY MOUTH EVERY DAY  . escitalopram (LEXAPRO) 10 MG tablet TAKE 1 TABLET BY MOUTH EVERY DAY  . lisinopril (PRINIVIL,ZESTRIL) 20 MG tablet TAKE 1 TABLET BY MOUTH EVERY DAY  . metoprolol succinate (TOPROL-XL) 25 MG 24 hr tablet Take 1 tablet (25 mg total) by mouth daily. Take with or immediately following a meal.  . Multiple Vitamins-Minerals (CENTRUM SILVER PO) Take 1 tablet by mouth at bedtime.  . nicotine (NICODERM CQ  - DOSED IN MG/24 HOURS) 14 mg/24hr patch Place 14 mg onto the skin daily.  Marland Kitchen omeprazole (PRILOSEC) 20 MG capsule Take 1 capsule (20 mg total) by mouth daily.  . Ranibizumab (LUCENTIS IO) Inject into the eye as directed. Eye injections once monthly- unsure of dose  . simvastatin (ZOCOR) 40 MG tablet TAKE 1 TABLET BY MOUTH AT BEDTIME  . YUVAFEM 10 MCG TABS vaginal tablet USE 1 TABLET VAGINALLY EVERY NIGHT FOR ONE WEEK, THEN 2 X  A WEEK AT BEDTIME     Allergies: Allergies  Allergen Reactions  . Other Hives and Diarrhea    Peppers   . Tomato Diarrhea  . Valium Other (See Comments)    Affects her behavioral    Social History: The patient  reports that she quit smoking about 5 years ago. Her smoking use included cigarettes. She smoked 1.00 pack per day. She has never used smokeless tobacco. She reports that she does not drink alcohol or use drugs.   Family History: The patient's family history includes Alcohol abuse in her father and mother; Breast cancer (age of onset: 27) in her maternal grandmother; Breast cancer (age of onset: 44) in her cousin; Breast cancer (age of onset: 16) in her maternal aunt; Breast cancer (age of onset: 68) in her mother; Breast cancer (age of onset: 75) in her maternal aunt; Cancer (age of onset: 57) in her cousin; Cancer - Lung in her father; Leukemia (age of onset: 62) in her sister; Lung cancer in her father; Stomach cancer (age of onset: 82) in her paternal aunt.   Review of Systems: Please see the history of present illness.   Otherwise, the review of systems is positive for none.   All other systems are reviewed and negative.   Physical Exam: VS:  BP (!) 150/80 (BP Location: Left Arm, Patient Position: Sitting, Cuff Size: Normal)   Pulse 69   Ht 5\' 7"  (1.702 m)   Wt 180 lb 12.8 oz (82 kg)   LMP 02/18/1995   BMI 28.32 kg/m  .  BMI Body mass index is 28.32 kg/m.  Wt Readings from Last 3 Encounters:  11/10/17 180 lb 12.8 oz (82 kg)  05/27/17 183 lb 6.4  oz (83.2 kg)  05/22/17 182 lb (82.6 kg)    General: Pleasant. Well developed, well nourished and in no acute distress. She remains overweight.   HEENT: Normal.  Neck: Supple, no JVD, carotid bruits, or masses noted.  Cardiac: Regular rate and rhythm. No murmurs, rubs, or gallops. No edema.  Respiratory:  Lungs are clear to auscultation bilaterally with normal work of breathing.  GI: Soft and nontender.  MS: No deformity or atrophy. Gait and ROM intact.  Skin: Warm and dry. Color is normal.  Neuro:  Strength and sensation are intact and no gross focal deficits noted.  Psych: Alert, appropriate and with normal affect.   LABORATORY DATA:  EKG:  EKG is ordered today. This shows NSR with nonspecific changes.   Lab Results  Component Value Date   WBC 6.9 01/30/2016   HGB 14.0 01/30/2016   HCT 42.8 01/30/2016   PLT 271 01/30/2016   GLUCOSE 92 05/27/2017   CHOL 145 03/20/2017   TRIG 184 (H) 03/20/2017   HDL 50 03/20/2017   LDLCALC 58 03/20/2017   ALT 17 03/20/2017   AST 20 03/20/2017   NA 144 05/27/2017   K 3.8 05/27/2017   CL 102 05/27/2017   CREATININE 1.01 (H) 05/27/2017   BUN 16 05/27/2017   CO2 27 05/27/2017   TSH 1.190 03/09/2017   INR 0.90 09/02/2013       BNP (last 3 results) No results for input(s): BNP in the last 8760 hours.  ProBNP (last 3 results) No results for input(s): PROBNP in the last 8760 hours.   Other Studies Reviewed Today:  EchoStudy Conclusions8/2018  - Left ventricle: The cavity size was mildly dilated. Systolic function was normal. The estimated ejection fraction was in the range of 55% to  60%. Wall motion was normal; there were no regional wall motion abnormalities. Features are consistent with a pseudonormal left ventricular filling pattern, with concomitant abnormal relaxation and increased filling pressure (grade 2 diastolic dysfunction). Doppler parameters are consistent with high ventricular filling pressure. -  Aortic valve: There was mild regurgitation. - Mitral valve: There was trivial regurgitation.   48 HR HolterStudy Highlights6/2018  Sinus rhythm with sinus bradycardia. Lowest heart rate 49 bpm at 4am.  Frequent premature ventricular contractions (760 during monitoring period) with several episodes of bigeminy.  Rare premature atrial contractions         Myoview Study Highlights from 10/2014    Nuclear stress EF: 47%.  The left ventricular ejection fraction is mildly decreased (45-54%).  There was no ST segment deviation noted during stress.  This is a low risk study.  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    Assessment/Plan:  1. CAD - remote MI - remote PCI - known history of vasospasm - last cath was 12 years ago - low risk Myoview from 2016. Poor exercise tolerance on GXT from 2018 - done on beta blocker. Now with progressive fatigue and chest heaviness. In light of her symptoms and history, I have recommended proceeding on with cardiac catheterization to further define. The patient understands that risks include but are not limited to stroke (1 in 1000), death (1 in 27), kidney failure [usually temporary] (1 in 500), bleeding (1 in 200), allergic reaction [possibly serious] (1 in 200), and agrees to proceed. Arranged for Monday with Dr. Tamala Julian. Restarting Imdur. Lab today. I will see her back in a few weeks for recheck. She remains on chronic DAPTgiven overlapping first generation DES.  2. Palpitations - history of bigeminy PVCs noted in the past. Does not seem as bothersome - still using too much Excedrin Migraine - cautioned again about this.   3. HTN - restarting Imdur - will follow  4. HLD - on statin  5. Tobacco abuse - long past history of smoking - back using the patch but not smoking.     6. Prior LV dysfunction - last echo ok. Would follow.   Current medicines are reviewed with the patient today.  The patient does not have concerns regarding medicines other than what has been noted above.  The following changes have been made:  See above.  Labs/ tests ordered today include:    Orders Placed This Encounter  Procedures  . CBC  . Comprehensive metabolic panel  . EKG 12-Lead     Disposition:   FU with me in a few weeks.   Patient is agreeable to this plan and will call if any problems develop in the interim.   SignedTruitt Merle, NP  11/10/2017 4:21 PM  [place] [address] [place], [place]  [address] Phone: [phone] Fax: [phone]

## 2017-11-10 NOTE — Progress Notes (Signed)
CARDIOLOGY OFFICE NOTE  Date:  11/10/2017    Donna Bullock Date of Birth: Oct 18, 1955 Medical Record #[id number]  PCP:  Jani Gravel, MD  Cardiologist:  Aldona Bar  Chief Complaint  Patient presents with  . Coronary Artery Disease    Follow up visit - seen for Dr. [name]    History of Present Illness: Donna Bullock is a 62 y.o. female who presents today for a follow up visit. Seen for Dr. [name]. She is a former patient of Dr. Susa Simmonds that I previously cared for.   She has a history of CAD, HLD, HTN, and tobacco abuse. Her CAD dates back to 1996 when she had a stent placed in her RCA. She had an anterior MI with cardiac arrest (VFib) in 2002. Cath showed a 75% distal LAD narrowing and this was managed conservatively with relook cath two days later showing 50% distal stenosis. She then had an inferior MI in 2003 with placement of overlapping Cypher drug eluting stents in the RCA. The LAD and Circumflex had mild disease per report. Four days later, she had severe chest pain, cath showed severe spasm of the LAD and Circumflex which resolved with IC vasodilators. Her last cath was in 2007 and showed patency of stents in the RCA and no other obstructive disease in the LAD and Circumflex. Remote stress test was in November 2009 and showed normal LVEF with no evidence of ischemia. She was seen in the ED 09/02/13 with bright red blood per rectum. Her ASA and Plavix were held. Colonoscopy 09/09/13 per Dr. Collene Mares with polyps removed but no other bleeding source identified. Aspirin was stopped but she has continued to use Excedrin Migraine.   Her other issues include RA, HLD, HTN and past tobacco abuse along with depression/anxiety.  I had not seen her in 5 1/2 years until August of 2016. She had since gotten married -toMark Soldier who I also see. She had stopped smoking. Some vague symptoms noted - did not want to have stress testing but then needed shoulder surgery and I got  her Myoview updated - no ischemia but EF a little down - better by echo from 2018 - she has been managed medically since.   I have followed her since then - she has basically done ok. Had more palpitations and we have gotten a heart monitor.  Ended up sending her to see Dr. Lovena Le for EP. Reassurance given.  I saw her last in April - she was doing well. She was taking care of Mark following his back surgery and had not been going to the gym. Still using too much Excedrin and Sudafed. Cautioned about this once again.   Comes in today. Here alone today. She has not had a good few months. She has had profound fatigue - out of proportion for what she feels she should be having. She has been having some chest heaviness over the past few months - this would be new - and more so with physical exertion. They have been cleaning out a house to put on the market - this has involved doing lots of manual/physical labor. This has caused her to have chest heaviness as well. She has had this heaviness that at times has caused her concern. No NTG use. Her symptoms seem to be progressive as well and now are worrisome. She could not get her Imdur refilled - has been out of for several weeks - but was having this even prior -  hard to say if this has helped or not.   Her prior anginal equivalent has not always been typical - she presented once with a VF arrest. Her vasospasm was more of a choking sensation.  Past Medical History:  Diagnosis Date  . Arthritis   . Benign tumor of adrenal gland   . CAD (coronary artery disease)    Inferior MI 1996 tx with bare metal stent RCA. Repeat  anterior MI 2002 with LAD vasospasm. Repeat inferior MI 2003 with occlusion RCA with overlapping Cypher DES in RCA. Repeat cath 2003 with profound vasospasm LAd and Circumflex. Repeat cath 2007  with patent RCA and no disease in LAD or Circumflex.  . Depression   . Environmental allergies   . Family history of adverse reaction to  anesthesia    mother had ? problem with anesthesia - was alcoholic - "mind was never right again"  . Family history of breast cancer   . Family history of leukemia   . Family history of stomach cancer   . GERD (gastroesophageal reflux disease)   . Headache    sinus headaches  . Heart murmur   . History of stomach ulcers    due to taking aspirin for headaches  . HTN (hypertension)   . Hyperlipidemia   . Myocardial infarction ([place])    times 4 "very minor" stents x2   . Osteopenia   . Osteoporosis   . Rotator cuff tear    left  . Sleep apnea    uses c pap  . Urinary incontinence     Past Surgical History:  Procedure Laterality Date  . ABDOMINAL HYSTERECTOMY    . BACK SURGERY    . LAPAROSCOPIC LYSIS OF ADHESIONS    . MYOMECTOMY     age 44  . PELVIC ABCESS DRAINAGE    . SHOULDER OPEN ROTATOR CUFF REPAIR Left 11/16/2014   Procedure: LEFT MINI OPEN ROTATOR CUFF REPAIR SHOULDER OPEN WITH SUBACROMIAL DECOMPRESSION;  Surgeon: Susa Day, MD;  Location: [facility];  Service: Orthopedics;  Laterality: Left;     Medications: Current Meds  Medication Sig  . Acetaminophen-Caffeine (EXCEDRIN ASPIRIN FREE PO) Take 2 tablets by mouth daily.   . calcium citrate-vitamin D (CITRACAL+D) 315-200 MG-UNIT per tablet Take 1 tablet by mouth 3 (three) times daily.  . clobetasol ointment (TEMOVATE) 0.05 % Apply a pea sized amount twice daily for 2 weeks  . clopidogrel (PLAVIX) 75 MG tablet TAKE 1 TABLET BY MOUTH EVERY DAY  . diltiazem (CARDIZEM CD) 120 MG 24 hr capsule TAKE 1 CAPSULE BY MOUTH EVERY DAY  . escitalopram (LEXAPRO) 10 MG tablet TAKE 1 TABLET BY MOUTH EVERY DAY  . lisinopril (PRINIVIL,ZESTRIL) 20 MG tablet TAKE 1 TABLET BY MOUTH EVERY DAY  . metoprolol succinate (TOPROL-XL) 25 MG 24 hr tablet Take 1 tablet (25 mg total) by mouth daily. Take with or immediately following a meal.  . Multiple Vitamins-Minerals (CENTRUM SILVER PO) Take 1 tablet by mouth at bedtime.  . nicotine (NICODERM CQ  - DOSED IN MG/24 HOURS) 14 mg/24hr patch Place 14 mg onto the skin daily.  Marland Kitchen omeprazole (PRILOSEC) 20 MG capsule Take 1 capsule (20 mg total) by mouth daily.  . Ranibizumab (LUCENTIS IO) Inject into the eye as directed. Eye injections once monthly- unsure of dose  . simvastatin (ZOCOR) 40 MG tablet TAKE 1 TABLET BY MOUTH AT BEDTIME  . YUVAFEM 10 MCG TABS vaginal tablet USE 1 TABLET VAGINALLY EVERY NIGHT FOR ONE WEEK, THEN 2 X  A WEEK AT BEDTIME     Allergies: Allergies  Allergen Reactions  . Other Hives and Diarrhea    Peppers   . Tomato Diarrhea  . Valium Other (See Comments)    Affects her behavioral    Social History: The patient  reports that she quit smoking about 5 years ago. Her smoking use included cigarettes. She smoked 1.00 pack per day. She has never used smokeless tobacco. She reports that she does not drink alcohol or use drugs.   Family History: The patient's family history includes Alcohol abuse in her father and mother; Breast cancer (age of onset: 69) in her maternal grandmother; Breast cancer (age of onset: 95) in her cousin; Breast cancer (age of onset: 56) in her maternal aunt; Breast cancer (age of onset: 31) in her mother; Breast cancer (age of onset: 1) in her maternal aunt; Cancer (age of onset: 67) in her cousin; Cancer - Lung in her father; Leukemia (age of onset: 54) in her sister; Lung cancer in her father; Stomach cancer (age of onset: 60) in her paternal aunt.   Review of Systems: Please see the history of present illness.   Otherwise, the review of systems is positive for none.   All other systems are reviewed and negative.   Physical Exam: VS:  BP (!) 150/80 (BP Location: Left Arm, Patient Position: Sitting, Cuff Size: Normal)   Pulse 69   Ht 5\' 7"  (1.702 m)   Wt 180 lb 12.8 oz (82 kg)   LMP 02/18/1995   BMI 28.32 kg/m  .  BMI Body mass index is 28.32 kg/m.  Wt Readings from Last 3 Encounters:  11/10/17 180 lb 12.8 oz (82 kg)  05/27/17 183 lb 6.4  oz (83.2 kg)  05/22/17 182 lb (82.6 kg)    General: Pleasant. Well developed, well nourished and in no acute distress. She remains overweight.   HEENT: Normal.  Neck: Supple, no JVD, carotid bruits, or masses noted.  Cardiac: Regular rate and rhythm. No murmurs, rubs, or gallops. No edema.  Respiratory:  Lungs are clear to auscultation bilaterally with normal work of breathing.  GI: Soft and nontender.  MS: No deformity or atrophy. Gait and ROM intact.  Skin: Warm and dry. Color is normal.  Neuro:  Strength and sensation are intact and no gross focal deficits noted.  Psych: Alert, appropriate and with normal affect.   LABORATORY DATA:  EKG:  EKG is ordered today. This shows NSR with nonspecific changes.   Lab Results  Component Value Date   WBC 6.9 01/30/2016   HGB 14.0 01/30/2016   HCT 42.8 01/30/2016   PLT 271 01/30/2016   GLUCOSE 92 05/27/2017   CHOL 145 03/20/2017   TRIG 184 (H) 03/20/2017   HDL 50 03/20/2017   LDLCALC 58 03/20/2017   ALT 17 03/20/2017   AST 20 03/20/2017   NA 144 05/27/2017   K 3.8 05/27/2017   CL 102 05/27/2017   CREATININE 1.01 (H) 05/27/2017   BUN 16 05/27/2017   CO2 27 05/27/2017   TSH 1.190 03/09/2017   INR 0.90 09/02/2013       BNP (last 3 results) No results for input(s): BNP in the last 8760 hours.  ProBNP (last 3 results) No results for input(s): PROBNP in the last 8760 hours.   Other Studies Reviewed Today:  EchoStudy Conclusions8/2018  - Left ventricle: The cavity size was mildly dilated. Systolic function was normal. The estimated ejection fraction was in the range of 55% to  60%. Wall motion was normal; there were no regional wall motion abnormalities. Features are consistent with a pseudonormal left ventricular filling pattern, with concomitant abnormal relaxation and increased filling pressure (grade 2 diastolic dysfunction). Doppler parameters are consistent with high ventricular filling pressure. -  Aortic valve: There was mild regurgitation. - Mitral valve: There was trivial regurgitation.   48 HR HolterStudy Highlights6/2018  Sinus rhythm with sinus bradycardia. Lowest heart rate 49 bpm at 4am.  Frequent premature ventricular contractions (760 during monitoring period) with several episodes of bigeminy.  Rare premature atrial contractions         Myoview Study Highlights from 10/2014    Nuclear stress EF: 47%.  The left ventricular ejection fraction is mildly decreased (45-54%).  There was no ST segment deviation noted during stress.  This is a low risk study.  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    Assessment/Plan:  1. CAD - remote MI - remote PCI - known history of vasospasm - last cath was 12 years ago - low risk Myoview from 2016. Poor exercise tolerance on GXT from 2018 - done on beta blocker. Now with progressive fatigue and chest heaviness. In light of her symptoms and history, I have recommended proceeding on with cardiac catheterization to further define. The patient understands that risks include but are not limited to stroke (1 in 1000), death (1 in 79), kidney failure [usually temporary] (1 in 500), bleeding (1 in 200), allergic reaction [possibly serious] (1 in 200), and agrees to proceed. Arranged for Monday with Dr. Tamala Julian. Restarting Imdur. Lab today. I will see her back in a few weeks for recheck. She remains on chronic DAPTgiven overlapping first generation DES.  2. Palpitations - history of bigeminy PVCs noted in the past. Does not seem as bothersome - still using too much Excedrin Migraine - cautioned again about this.   3. HTN - restarting Imdur - will follow  4. HLD - on statin  5. Tobacco abuse - long past history of smoking - back using the patch but not smoking.     6. Prior LV dysfunction - last echo ok. Would follow.   Current medicines are reviewed with the patient today.  The patient does not have concerns regarding medicines other than what has been noted above.  The following changes have been made:  See above.  Labs/ tests ordered today include:    Orders Placed This Encounter  Procedures  . CBC  . Comprehensive metabolic panel  . EKG 12-Lead     Disposition:   FU with me in a few weeks.   Patient is agreeable to this plan and will call if any problems develop in the interim.   SignedTruitt Merle, NP  11/10/2017 4:21 PM  [facility] [address] [place], [place]  [address] Phone: [phone] Fax: [phone]

## 2017-11-11 LAB — COMPREHENSIVE METABOLIC PANEL
ALT: 16 IU/L (ref 0–32)
AST: 22 IU/L (ref 0–40)
Albumin/Globulin Ratio: 2 (ref 1.2–2.2)
Albumin: 4.6 g/dL (ref 3.6–4.8)
Alkaline Phosphatase: 98 IU/L (ref 39–117)
BUN/Creatinine Ratio: 17 (ref 12–28)
BUN: 13 mg/dL (ref 8–27)
Bilirubin Total: 0.4 mg/dL (ref 0.0–1.2)
CO2: 22 mmol/L (ref 20–29)
Calcium: 9.8 mg/dL (ref 8.7–10.3)
Chloride: 103 mmol/L (ref 96–106)
Creatinine, Ser: 0.76 mg/dL (ref 0.57–1.00)
GFR calc Af Amer: 96 mL/min/{1.73_m2} (ref >59)
GFR calc non Af Amer: 83 mL/min/{1.73_m2} (ref >59)
Globulin, Total: 2.3 g/dL (ref 1.5–4.5)
Glucose: 90 mg/dL (ref 65–99)
Potassium: 4.5 mmol/L (ref 3.5–5.2)
Sodium: 144 mmol/L (ref 134–144)
Total Protein: 6.9 g/dL (ref 6.0–8.5)

## 2017-11-11 LAB — CBC
Hematocrit: 43.8 % (ref 34.0–46.6)
Hemoglobin: 14.3 g/dL (ref 11.1–15.9)
MCH: 30.1 pg (ref 26.6–33.0)
MCHC: 32.6 g/dL (ref 31.5–35.7)
MCV: 92 fL (ref 79–97)
Platelets: 305 10*3/uL (ref 150–450)
RBC: 4.75 x10E6/uL (ref 3.77–5.28)
RDW: 15.3 % (ref 12.3–15.4)
WBC: 10.4 10*3/uL (ref 3.4–10.8)

## 2017-11-12 ENCOUNTER — Telehealth: Payer: Self-pay | Admitting: *Deleted

## 2017-11-12 NOTE — Telephone Encounter (Signed)
Pt contacted pre-catheterization scheduled at [facility] for: Monday November 16, 2017 7:30 AM Verified arrival time and place: [facility] En[facility] A at: 5:30 AM  No solid food after midnight prior to cath, clear liquids until 5 AM day of procedure. Verified no contrast allergy. Verified no diabetes medications.  AM meds can be  taken pre-cath with sip of water including: ASA 81 mg Clopidogrel 75 mg  Confirmed patient has responsible person to drive home post procedure and for 24 hours after you arrive home: yes

## 2017-11-16 ENCOUNTER — Ambulatory Visit (HOSPITAL_COMMUNITY)
Admission: RE | Admit: 2017-11-16 | Discharge: 2017-11-16 | Disposition: A | Discharge status: Home / Self Care | Payer: Managed Care, Other (non HMO) | Source: Ambulatory Visit | Attending: Interventional Cardiology | Admitting: Interventional Cardiology

## 2017-11-16 ENCOUNTER — Encounter (HOSPITAL_COMMUNITY): Payer: Self-pay | Admitting: Interventional Cardiology

## 2017-11-16 ENCOUNTER — Encounter (HOSPITAL_COMMUNITY)
Admission: RE | Disposition: A | Discharge status: Home / Self Care | Payer: Self-pay | Source: Ambulatory Visit | Attending: Interventional Cardiology

## 2017-11-16 ENCOUNTER — Other Ambulatory Visit: Payer: Self-pay

## 2017-11-16 DIAGNOSIS — I2510 Atherosclerotic heart disease of native coronary artery without angina pectoris: Secondary | ICD-10-CM

## 2017-11-16 DIAGNOSIS — Z86018 Personal history of other benign neoplasm: Secondary | ICD-10-CM | POA: Insufficient documentation

## 2017-11-16 DIAGNOSIS — Z79899 Other long term (current) drug therapy: Secondary | ICD-10-CM | POA: Insufficient documentation

## 2017-11-16 DIAGNOSIS — Z90710 Acquired absence of both cervix and uterus: Secondary | ICD-10-CM | POA: Diagnosis not present

## 2017-11-16 DIAGNOSIS — Z955 Presence of coronary angioplasty implant and graft: Secondary | ICD-10-CM | POA: Insufficient documentation

## 2017-11-16 DIAGNOSIS — E785 Hyperlipidemia, unspecified: Secondary | ICD-10-CM | POA: Diagnosis not present

## 2017-11-16 DIAGNOSIS — Z87891 Personal history of nicotine dependence: Secondary | ICD-10-CM | POA: Diagnosis not present

## 2017-11-16 DIAGNOSIS — Z7902 Long term (current) use of antithrombotics/antiplatelets: Secondary | ICD-10-CM | POA: Insufficient documentation

## 2017-11-16 DIAGNOSIS — Z91018 Allergy to other foods: Secondary | ICD-10-CM | POA: Diagnosis not present

## 2017-11-16 DIAGNOSIS — G4730 Sleep apnea, unspecified: Secondary | ICD-10-CM | POA: Diagnosis not present

## 2017-11-16 DIAGNOSIS — M1990 Unspecified osteoarthritis, unspecified site: Secondary | ICD-10-CM | POA: Insufficient documentation

## 2017-11-16 DIAGNOSIS — K219 Gastro-esophageal reflux disease without esophagitis: Secondary | ICD-10-CM | POA: Diagnosis not present

## 2017-11-16 DIAGNOSIS — Z9889 Other specified postprocedural states: Secondary | ICD-10-CM | POA: Insufficient documentation

## 2017-11-16 DIAGNOSIS — I10 Essential (primary) hypertension: Secondary | ICD-10-CM | POA: Diagnosis not present

## 2017-11-16 DIAGNOSIS — Z8711 Personal history of peptic ulcer disease: Secondary | ICD-10-CM | POA: Insufficient documentation

## 2017-11-16 DIAGNOSIS — Z888 Allergy status to other drugs, medicaments and biological substances status: Secondary | ICD-10-CM | POA: Diagnosis not present

## 2017-11-16 DIAGNOSIS — I252 Old myocardial infarction: Secondary | ICD-10-CM | POA: Diagnosis not present

## 2017-11-16 DIAGNOSIS — M8580 Other specified disorders of bone density and structure, unspecified site: Secondary | ICD-10-CM | POA: Insufficient documentation

## 2017-11-16 DIAGNOSIS — F329 Major depressive disorder, single episode, unspecified: Secondary | ICD-10-CM | POA: Insufficient documentation

## 2017-11-16 DIAGNOSIS — F419 Anxiety disorder, unspecified: Secondary | ICD-10-CM | POA: Diagnosis not present

## 2017-11-16 DIAGNOSIS — R002 Palpitations: Secondary | ICD-10-CM | POA: Diagnosis not present

## 2017-11-16 DIAGNOSIS — Z8674 Personal history of sudden cardiac arrest: Secondary | ICD-10-CM | POA: Diagnosis not present

## 2017-11-16 DIAGNOSIS — I259 Chronic ischemic heart disease, unspecified: Secondary | ICD-10-CM

## 2017-11-16 HISTORY — PX: LEFT HEART CATH AND CORONARY ANGIOGRAPHY: CATH118249

## 2017-11-16 SURGERY — LEFT HEART CATH AND CORONARY ANGIOGRAPHY
Anesthesia: LOCAL

## 2017-11-16 MED ORDER — ASPIRIN 81 MG PO CHEW: 81.0000 mg | CHEWABLE_TABLET | ORAL | Status: DC

## 2017-11-16 MED ORDER — VERAPAMIL HCL 2.5 MG/ML IV SOLN
INTRAVENOUS | Status: DC | PRN
  Administered 2017-11-16: 10 mL via INTRA_ARTERIAL

## 2017-11-16 MED ORDER — SODIUM CHLORIDE 0.9% FLUSH: 3.0000 mL | Freq: Two times a day (BID) | INTRAVENOUS | Status: DC

## 2017-11-16 MED ORDER — HEPARIN SODIUM (PORCINE) 1000 UNIT/ML IJ SOLN
INTRAMUSCULAR | Status: AC
  Filled 2017-11-16: qty 1

## 2017-11-16 MED ORDER — DIAZEPAM 5 MG PO TABS: 10.0000 mg | ORAL_TABLET | ORAL | Status: DC

## 2017-11-16 MED ORDER — IOHEXOL 350 MG/ML SOLN
INTRAVENOUS | Status: DC | PRN
  Administered 2017-11-16: 55 mL

## 2017-11-16 MED ORDER — SODIUM CHLORIDE 0.9 % IV SOLN: INTRAVENOUS | Status: DC

## 2017-11-16 MED ORDER — ASPIRIN 81 MG PO CHEW: 81.0000 mg | CHEWABLE_TABLET | Freq: Every day | ORAL | Status: DC

## 2017-11-16 MED ORDER — SODIUM CHLORIDE 0.9 % IV SOLN
INTRAVENOUS | Status: DC
  Administered 2017-11-16: 06:00:00 via INTRAVENOUS

## 2017-11-16 MED ORDER — VERAPAMIL HCL 2.5 MG/ML IV SOLN
INTRAVENOUS | Status: AC
  Filled 2017-11-16: qty 2

## 2017-11-16 MED ORDER — ONDANSETRON HCL 4 MG/2ML IJ SOLN: 4.0000 mg | Freq: Four times a day (QID) | INTRAMUSCULAR | Status: DC | PRN

## 2017-11-16 MED ORDER — FENTANYL CITRATE (PF) 100 MCG/2ML IJ SOLN
INTRAMUSCULAR | Status: AC
  Filled 2017-11-16: qty 2

## 2017-11-16 MED ORDER — FENTANYL CITRATE (PF) 100 MCG/2ML IJ SOLN
INTRAMUSCULAR | Status: DC | PRN
  Administered 2017-11-16 (×2): 25 ug via INTRAVENOUS

## 2017-11-16 MED ORDER — MIDAZOLAM HCL 2 MG/2ML IJ SOLN
INTRAMUSCULAR | Status: AC
  Filled 2017-11-16: qty 2

## 2017-11-16 MED ORDER — HEPARIN SODIUM (PORCINE) 1000 UNIT/ML IJ SOLN
INTRAMUSCULAR | Status: DC | PRN
  Administered 2017-11-16: 4000 [IU] via INTRAVENOUS

## 2017-11-16 MED ORDER — SODIUM CHLORIDE 0.9 % IV SOLN: 250.0000 mL | INTRAVENOUS | Status: DC | PRN

## 2017-11-16 MED ORDER — LIDOCAINE HCL (PF) 1 % IJ SOLN
INTRAMUSCULAR | Status: DC | PRN
  Administered 2017-11-16: 2 mL

## 2017-11-16 MED ORDER — ACETAMINOPHEN 325 MG PO TABS: 650.0000 mg | ORAL_TABLET | ORAL | Status: DC | PRN

## 2017-11-16 MED ORDER — HEPARIN (PORCINE) IN NACL 1000-0.9 UT/500ML-% IV SOLN
INTRAVENOUS | Status: DC | PRN
  Administered 2017-11-16 (×2): 500 mL

## 2017-11-16 MED ORDER — HEPARIN (PORCINE) IN NACL 1000-0.9 UT/500ML-% IV SOLN
INTRAVENOUS | Status: AC
  Filled 2017-11-16: qty 1000

## 2017-11-16 MED ORDER — OXYCODONE HCL 5 MG PO TABS
5.0000 mg | ORAL_TABLET | ORAL | Status: DC | PRN
  Administered 2017-11-16: 10 mg via ORAL
  Filled 2017-11-16: qty 2

## 2017-11-16 MED ORDER — MIDAZOLAM HCL 2 MG/2ML IJ SOLN
INTRAMUSCULAR | Status: DC | PRN
  Administered 2017-11-16: 1 mg via INTRAVENOUS
  Administered 2017-11-16: 0.5 mg via INTRAVENOUS

## 2017-11-16 MED ORDER — SODIUM CHLORIDE 0.9% FLUSH: 3.0000 mL | INTRAVENOUS | Status: DC | PRN

## 2017-11-16 MED ORDER — LIDOCAINE HCL (PF) 1 % IJ SOLN
INTRAMUSCULAR | Status: AC
  Filled 2017-11-16: qty 30

## 2017-11-16 SURGICAL SUPPLY — 10 items
CATH 5FR JL3.5 JR4 ANG PIG MP (CATHETERS) ×1 IMPLANT
DEVICE RAD COMP TR BAND LRG (VASCULAR PRODUCTS) ×1 IMPLANT
GLIDESHEATH SLEND A-KIT 6F 22G (SHEATH) ×1 IMPLANT
GUIDEWIRE INQWIRE 1.5J.035X260 (WIRE) IMPLANT
INQWIRE 1.5J .035X260CM (WIRE) ×2
KIT HEART LEFT (KITS) ×2 IMPLANT
PACK CARDIAC CATHETERIZATION (CUSTOM PROCEDURE TRAY) ×2 IMPLANT
SHEATH PROBE COVER 6X72 (BAG) ×1 IMPLANT
TRANSDUCER W/STOPCOCK (MISCELLANEOUS) ×2 IMPLANT
TUBING CIL FLEX 10 FLL-RA (TUBING) ×2 IMPLANT

## 2017-11-16 NOTE — Interval H&P Note (Signed)
Cath Lab Visit (complete for each Cath Lab visit)  Clinical Evaluation Leading to the Procedure:   ACS: No.  Non-ACS:    Anginal Classification: CCS III  Anti-ischemic medical therapy: Minimal Therapy (1 class of medications)  Non-Invasive Test Results: No non-invasive testing performed  Prior CABG: No previous CABG      History and Physical Interval Note:  11/16/2017 7:36 AM  Donna Bullock  has presented today for surgery, with the diagnosis of cp  The various methods of treatment have been discussed with the patient and family. After consideration of risks, benefits and other options for treatment, the patient has consented to  Procedure(s): LEFT HEART CATH AND CORONARY ANGIOGRAPHY (N/A) as a surgical intervention .  The patient's history has been reviewed, patient examined, no change in status, stable for surgery.  I have reviewed the patient's chart and labs.  Questions were answered to the patient's satisfaction.     Belva Crome III

## 2017-11-16 NOTE — Discharge Instructions (Signed)
Radial Site Care °Refer to this sheet in the next few weeks. These instructions provide you with information about caring for yourself after your procedure. Your health care provider may also give you more specific instructions. Your treatment has been planned according to current medical practices, but problems sometimes occur. Call your health care provider if you have any problems or questions after your procedure. °What can I expect after the procedure? °After your procedure, it is typical to have the following: °· Bruising at the radial site that usually fades within 1-2 weeks. °· Blood collecting in the tissue (hematoma) that may be painful to the touch. It should usually decrease in size and tenderness within 1-2 weeks. ° °Follow these instructions at home: °· Take medicines only as directed by your health care provider. °· You may shower 24-48 hours after the procedure or as directed by your health care provider. Remove the bandage (dressing) and gently wash the site with plain soap and water. Pat the area dry with a clean towel. Do not rub the site, because this may cause bleeding. °· Do not take baths, swim, or use a hot tub until your health care provider approves. °· Check your insertion site every day for redness, swelling, or drainage. °· Do not apply powder or lotion to the site. °· Do not flex or bend the affected arm for 24 hours or as directed by your health care provider. °· Do not push or pull heavy objects with the affected arm for 24 hours or as directed by your health care provider. °· Do not lift over 10 lb (4.5 kg) for 5 days after your procedure or as directed by your health care provider. °· Ask your health care provider when it is okay to: °? Return to work or school. °? Resume usual physical activities or sports. °? Resume sexual activity. °· Do not drive home if you are discharged the same day as the procedure. Have someone else drive you. °· You may drive 24 hours after the procedure  unless otherwise instructed by your health care provider. °· Do not operate machinery or power tools for 24 hours after the procedure. °· If your procedure was done as an outpatient procedure, which means that you went home the same day as your procedure, a responsible adult should be with you for the first 24 hours after you arrive home. °· Keep all follow-up visits as directed by your health care provider. This is important. °Contact a health care provider if: °· You have a fever. °· You have chills. °· You have increased bleeding from the radial site. Hold pressure on the site. °Get help right away if: °· You have unusual pain at the radial site. °· You have redness, warmth, or swelling at the radial site. °· You have drainage (other than a small amount of blood on the dressing) from the radial site. °· The radial site is bleeding, and the bleeding does not stop after 30 minutes of holding steady pressure on the site. °· Your arm or hand becomes pale, cool, tingly, or numb. °This information is not intended to replace advice given to you by your health care provider. Make sure you discuss any questions you have with your health care provider. °Document Released: 03/08/2010 Document Revised: 07/12/2015 Document Reviewed: 08/22/2013 °Elsevier Interactive Patient Education © 2018 [employer] ° ° ° °Moderate Conscious Sedation, Adult, Care After °These instructions provide you with information about caring for yourself after your procedure. Your health care provider   may also give you more specific instructions. Your treatment has been planned according to current medical practices, but problems sometimes occur. Call your health care provider if you have any problems or questions after your procedure. °What can I expect after the procedure? °After your procedure, it is common: °· To feel sleepy for several hours. °· To feel clumsy and have poor balance for several hours. °· To have poor judgment for several  hours. °· To vomit if you eat too soon. ° °Follow these instructions at home: °For at least 24 hours after the procedure: ° °· Do not: °? Participate in activities where you could fall or become injured. °? Drive. °? Use heavy machinery. °? Drink alcohol. °? Take sleeping pills or medicines that cause drowsiness. °? Make important decisions or sign legal documents. °? Take care of children on your own. °· Rest. °Eating and drinking °· Follow the diet recommended by your health care provider. °· If you vomit: °? Drink water, juice, or soup when you can drink without vomiting. °? Make sure you have little or no nausea before eating solid foods. °General instructions °· Have a responsible adult stay with you until you are awake and alert. °· Take over-the-counter and prescription medicines only as told by your health care provider. °· If you smoke, do not smoke without supervision. °· Keep all follow-up visits as told by your health care provider. This is important. °Contact a health care provider if: °· You keep feeling nauseous or you keep vomiting. °· You feel light-headed. °· You develop a rash. °· You have a fever. °Get help right away if: °· You have trouble breathing. °This information is not intended to replace advice given to you by your health care provider. Make sure you discuss any questions you have with your health care provider. °Document Released: 11/24/2012 Document Revised: 07/09/2015 Document Reviewed: 05/26/2015 °Elsevier Interactive Patient Education © 2018 [employer] ° °

## 2017-11-17 ENCOUNTER — Other Ambulatory Visit: Payer: Self-pay | Admitting: Internal Medicine

## 2017-11-17 DIAGNOSIS — Z1231 Encounter for screening mammogram for malignant neoplasm of breast: Secondary | ICD-10-CM

## 2017-11-24 ENCOUNTER — Encounter: Payer: Self-pay | Admitting: Nurse Practitioner

## 2017-11-24 ENCOUNTER — Ambulatory Visit (INDEPENDENT_AMBULATORY_CARE_PROVIDER_SITE_OTHER): Payer: Managed Care, Other (non HMO) | Admitting: Nurse Practitioner

## 2017-11-24 VITALS — BP 132/82 | HR 80 | Ht 67.0 in | Wt 183.4 lb

## 2017-11-24 DIAGNOSIS — R002 Palpitations: Secondary | ICD-10-CM

## 2017-11-24 DIAGNOSIS — I2510 Atherosclerotic heart disease of native coronary artery without angina pectoris: Secondary | ICD-10-CM | POA: Diagnosis not present

## 2017-11-24 DIAGNOSIS — I10 Essential (primary) hypertension: Secondary | ICD-10-CM

## 2017-11-24 DIAGNOSIS — I259 Chronic ischemic heart disease, unspecified: Secondary | ICD-10-CM

## 2017-11-24 NOTE — Patient Instructions (Signed)
We will be checking the following labs today - NONE  If you have labs (blood work) drawn today and your tests are completely normal, you will receive your results only by: . MyChart Message (if you have MyChart) OR . A paper copy in the mail If you have any lab test that is abnormal or we need to change your treatment, we will call you to review the results.   Medication Instructions:    Continue with your current medicines.    If you need a refill on your cardiac medications before your next appointment, please call your pharmacy.     Testing/Procedures To Be Arranged:  N/A  Follow-Up:   See me in 6 months    At [facility], you and your health needs are our priority.  As part of our continuing mission to provide you with exceptional heart care, we have created designated Provider Care Teams.  These Care Teams include your primary Cardiologist (physician) and Advanced Practice Providers (APPs -  Physician Assistants and Nurse Practitioners) who all work together to provide you with the care you need, when you need it.  Special Instructions:  . None  Call the [facility] office at [phone] if you have any questions, problems or concerns.       

## 2017-11-24 NOTE — Progress Notes (Addendum)
CARDIOLOGY OFFICE NOTE  Date:  11/24/2017      Donna Bullock Date of Birth: [birth year] Medical Record #[id number]  PCP:  Jani Gravel, MD  Cardiologist:  Aldona Bar    Chief Complaint  Patient presents with  . Coronary Artery Disease    Post cath visit    History of Present Illness: Donna Bullock is a 62 y.o. female who presents today for a follow up/post cath visit.  Seen for Dr. [name]. She is a former patient of Dr. Susa Simmonds that I previously cared for. She primarily follows with me.   She has a history of CAD, HLD, HTN, and tobacco abuse. Her CAD dates back to 1996 when she had a stent placed in her RCA. She had an anterior MI with cardiac arrest (VFib) in 2002. Cath showed a 75% distal LAD narrowing and this was managed conservatively with relook cath two days later showing 50% distal stenosis. She then had an inferior MI in 2003 with placement of overlapping Cypher drug eluting stents in the RCA. The LAD and Circumflex had mild disease per report. Four days later, she had severe chest pain, cath showed severe spasm of the LAD and Circumflex which resolved with IC vasodilators. Her last cath was in 2007 and showed patency of stents in the RCA and no other obstructive disease in the LAD and Circumflex. Remote stress test was in November 2009 and showed normal LVEF with no evidence of ischemia. She was seen in the ED 09/02/13 with bright red blood per rectum. Her ASA and Plavix were held. Colonoscopy 09/09/13 per Dr. Collene Mares with polyps removed but no other bleeding source identified. Aspirin wasstopped but shehas continued to useExcedrin Migraine.   Her other issues include RA, HLD, HTN and past tobacco abuse along with depression/anxiety.  I had not seen her in 5 1/2 years until August of 2016. She had since gotten married -toMark Underhill Bullock who I also see. She had stopped smoking. Some vague symptoms noted - did not want to have stress testing but then needed  shoulder surgery and I got her Myoview updated - no ischemia but EF a little down - better by echo from 2018 - she has been managed medically since.   I have followed her since then - she has basically done ok. Had more palpitations and we have gotten a heart monitor and a myoview updated.  She has seen Dr. Lovena Le for EP. Has had trouble chronically with staying on good track with CV risk factor modification.   Seen just a few weeks ago - she was not doing well. Having more chest pain. More fatigue. Ended up referring back for cath (her last one had been 12 years prior). Her prior anginal equivalent has not always been typical - she presented once with a VF arrest. Her vasospasm was more of a choking sensation and she has had SCAD twice. We did restart her Imdur. Cath was reassuring.   Comes in today. Here with Donna Bullock. She is doing quite well. She is so appreciative of his care. She feels much better. She did have some neck pain prior to her cath - actually vomited twice in the parking lot - now resolved. She is thinking of seeing GI. She is quite pleased with how she is doing. She has no complaint.   Past Medical History:  Diagnosis Date  . Arthritis   . Benign tumor of adrenal gland   . CAD (coronary artery disease)  Inferior MI 1996 tx with bare metal stent RCA. Repeat  anterior MI 2002 with LAD vasospasm. Repeat inferior MI 2003 with occlusion RCA with overlapping Cypher DES in RCA. Repeat cath 2003 with profound vasospasm LAd and Circumflex. Repeat cath 2007  with patent RCA and no disease in LAD or Circumflex.  . Depression   . Environmental allergies   . Family history of adverse reaction to anesthesia    mother had ? problem with anesthesia - was alcoholic - "mind was never right again"  . Family history of breast cancer   . Family history of leukemia   . Family history of stomach cancer   . GERD (gastroesophageal reflux disease)   . Headache    sinus headaches  . Heart murmur   .  History of stomach ulcers    due to taking aspirin for headaches  . HTN (hypertension)   . Hyperlipidemia   . Myocardial infarction ([place])    times 4 "very minor" stents x2   . Osteopenia   . Osteoporosis   . Rotator cuff tear    left  . Sleep apnea    uses c pap  . Urinary incontinence     Past Surgical History:  Procedure Laterality Date  . ABDOMINAL HYSTERECTOMY    . BACK SURGERY    . LAPAROSCOPIC LYSIS OF ADHESIONS    . LEFT HEART CATH AND CORONARY ANGIOGRAPHY N/A 11/16/2017   Procedure: LEFT HEART CATH AND CORONARY ANGIOGRAPHY;  Surgeon: Belva Crome, MD;  Location: [facility];  Service: Cardiovascular;  Laterality: N/A;  . MYOMECTOMY     age 38  . PELVIC ABCESS DRAINAGE    . SHOULDER OPEN ROTATOR CUFF REPAIR Left 11/16/2014   Procedure: LEFT MINI OPEN ROTATOR CUFF REPAIR SHOULDER OPEN WITH SUBACROMIAL DECOMPRESSION;  Surgeon: Susa Day, MD;  Location: [facility];  Service: Orthopedics;  Laterality: Left;     Medications: Current Meds  Medication Sig  . Acetaminophen-Caffeine (EXCEDRIN ASPIRIN FREE PO) Take 2 tablets by mouth daily.   . Artificial Tear Solution (SOOTHE XP) SOLN Place 1 drop into both eyes daily as needed (dry eyes).  . Calcium Citrate-Vitamin D (CITRACAL + D PO) Take 1 tablet by mouth 2 (two) times daily.  . clopidogrel (PLAVIX) 75 MG tablet Take 75 mg by mouth daily.  Marland Kitchen diltiazem (CARDIZEM CD) 120 MG 24 hr capsule TAKE 1 CAPSULE BY MOUTH EVERY DAY  . diphenhydrAMINE (BENADRYL) 25 MG tablet Take 25-50 mg by mouth daily as needed for allergies.  Marland Kitchen escitalopram (LEXAPRO) 10 MG tablet TAKE 1 TABLET BY MOUTH EVERY DAY  . isosorbide mononitrate (IMDUR) 30 MG 24 hr tablet Take 15 mg by mouth at bedtime.  Marland Kitchen lisinopril (PRINIVIL,ZESTRIL) 20 MG tablet Take 20 mg by mouth daily.  . metoprolol succinate (TOPROL-XL) 25 MG 24 hr tablet Take 25 mg by mouth daily.  . montelukast (SINGULAIR) 10 MG tablet Take 10 mg by mouth at bedtime.  . Multiple  Vitamins-Minerals (CENTRUM SILVER PO) Take 1 tablet by mouth at bedtime.  . Multiple Vitamins-Minerals (PRESERVISION AREDS 2) CAPS Take 1 capsule by mouth 2 (two) times daily.  . nicotine (NICODERM CQ - DOSED IN MG/24 HOURS) 14 mg/24hr patch Place 14 mg onto the skin daily.  Marland Kitchen omeprazole (PRILOSEC) 20 MG capsule Take 1 capsule (20 mg total) by mouth daily.  . Ranibizumab (LUCENTIS IO) Inject into the eye as directed. Eye injections once monthly- unsure of dose  . simvastatin (ZOCOR) 40 MG tablet  Take 40 mg by mouth daily.     Allergies: Allergies  Allergen Reactions  . Other Hives and Diarrhea    Peppers   . Tomato Diarrhea  . Valium Other (See Comments)    Affects her behavioral    Social History: The patient  reports that she quit smoking about 6 years ago. Her smoking use included cigarettes. She smoked 1.00 pack per day. She has never used smokeless tobacco. She reports that she does not drink alcohol or use drugs.   Family History: The patient's family history includes Alcohol abuse in her father and mother; Breast cancer (age of onset: 65) in her maternal grandmother; Breast cancer (age of onset: 49) in her cousin; Breast cancer (age of onset: 52) in her maternal aunt; Breast cancer (age of onset: 82) in her mother; Breast cancer (age of onset: 5) in her maternal aunt; Cancer (age of onset: 21) in her cousin; Cancer - Lung in her father; Leukemia (age of onset: 6) in her sister; Lung cancer in her father; Stomach cancer (age of onset: 28) in her paternal aunt.   Review of Systems: Please see the history of present illness.   Otherwise, the review of systems is positive for none.   All other systems are reviewed and negative.   Physical Exam: VS:  BP 132/82   Pulse 80   Ht 5\' 7"  (1.702 m)   Wt 183 lb 6.4 oz (83.2 kg)   LMP 02/18/1995   SpO2 98%   BMI 28.72 kg/m  .  BMI Body mass index is 28.72 kg/m.  Wt Readings from Last 3 Encounters:  11/24/17 183 lb 6.4 oz (83.2 kg)    11/16/17 180 lb (81.6 kg)  11/10/17 180 lb 12.8 oz (82 kg)   Physical Exam.  She is alert and oriented.  Skin warm and dry. Color is normal.  Her cath site is fine.   LABORATORY DATA:  EKG:  EKG is not ordered today.  Lab Results  Component Value Date   WBC 10.4 11/10/2017   HGB 14.3 11/10/2017   HCT 43.8 11/10/2017   PLT 305 11/10/2017   GLUCOSE 90 11/10/2017   CHOL 145 03/20/2017   TRIG 184 (H) 03/20/2017   HDL 50 03/20/2017   LDLCALC 58 03/20/2017   ALT 16 11/10/2017   AST 22 11/10/2017   NA 144 11/10/2017   K 4.5 11/10/2017   CL 103 11/10/2017   CREATININE 0.76 11/10/2017   BUN 13 11/10/2017   CO2 22 11/10/2017   TSH 1.190 03/09/2017   INR 0.90 09/02/2013     BNP (last 3 results) No results for input(s): BNP in the last 8760 hours.  ProBNP (last 3 results) No results for input(s): PROBNP in the last 8760 hours.   Other Studies Reviewed Today:  LEFT HEART CATH AND CORONARY ANGIOGRAPHY 11/16/2017  Conclusion    Review of prior images demonstrates distal LAD SCAD in December 2002 that healed spontaneously.  10 months later, October 2003, SCAD in distal RCA treated with stenting.  Left main is normal  LAD is large and wraps around the apex.  LAD is normal and previous site of SCAD 15 years ago is normal.  Circumflex coronary artery is normal.  RCA is widely patent.  Segmental 30% proximal to mid atherosclerotic disease.  Mid to distal RCA stents are widely patent.  The stents are in the distribution of previous SCAD October 2003.  Inferobasal akinesis.  EF 45 to 50%.  Normal LVEDP.  RECOMMENDATIONS:   Continue aggressive primary prevention/risk factor modification: Monitor glycemic control, blood pressure less than 130/80 mmHg, LDL less than 70, smoking cessation, and at least 150 minutes of moderate aerobic activity per week  Current symptoms not felt to represent ischemia/obstructive CAD.  Recommend Aspirin 81mg  daily for moderate CAD.    EchoStudy Conclusions8/2018  - Left ventricle: The cavity size was mildly dilated. Systolic function was normal. The estimated ejection fraction was in the range of 55% to 60%. Wall motion was normal; there were no regional wall motion abnormalities. Features are consistent with a pseudonormal left ventricular filling pattern, with concomitant abnormal relaxation and increased filling pressure (grade 2 diastolic dysfunction). Doppler parameters are consistent with high ventricular filling pressure. - Aortic valve: There was mild regurgitation. - Mitral valve: There was trivial regurgitation.   48 HR HolterStudy Highlights6/2018  Sinus rhythm with sinus bradycardia. Lowest heart rate 49 bpm at 4am.  Frequent premature ventricular contractions (760 during monitoring period) with several episodes of bigeminy.  Rare premature atrial contractions     Assessment/Plan:  1. CAD with remote MI, history of prior SCAD and vasospasm, remote PCI to the distal RCA - now s/p recent cath with reassuring data - needs to get back to CV risk factor modification.   2. HTN - BP ok.   3. Palpitations - not discussed  4. Former smoker  5. HLD - on statin.   6. Prior LV dysfunction - EF is 45 to 50% by cath 10/2017  Current medicines are reviewed with the patient today.  The patient does not have concerns regarding medicines other than what has been noted above.  The following changes have been made:  See above.  Labs/ tests ordered today include:   No orders of the defined types were placed in this encounter.    Disposition:   FU with me in 6 months.    Patient is agreeable to this plan and will call if any problems develop in the interim.   SignedTruitt Merle, NP  11/24/2017 3:01 PM  [facility] [address] [place], [place]  [address] Phone: [phone] Fax: [phone]

## 2017-11-25 NOTE — Addendum Note (Signed)
Addended by: Burtis Junes on: 11/25/2017 09:28 AM   Modules accepted: Level of Service

## 2017-12-03 ENCOUNTER — Telehealth: Payer: Self-pay | Admitting: Nurse Practitioner

## 2017-12-03 MED ORDER — ISOSORBIDE MONONITRATE ER 30 MG PO TB24: 15.0000 mg | ORAL_TABLET | Freq: Every day | ORAL | 3 refills | Status: DC

## 2017-12-03 MED ORDER — METOPROLOL SUCCINATE ER 25 MG PO TB24: 25.0000 mg | ORAL_TABLET | Freq: Every day | ORAL | 3 refills | Status: DC

## 2017-12-03 NOTE — Telephone Encounter (Signed)
New Message:        *STAT* If patient is at the pharmacy, call can be transferred to refill team.   1. Which medications need to be refilled? (please list name of each medication and dose if known)  isosorbide mononitrate (IMDUR) 30 MG 24 hr tablet  metoprolol succinate (TOPROL-XL) 25 MG 24 hr tablet  2. Which pharmacy/location (including street and city if local pharmacy) is medication to be sent to?[place], [place]   3. Do they need a 30 day or 90 day supply? [place]

## 2017-12-03 NOTE — Telephone Encounter (Signed)
Pt's medications were sent to pt's pharmacy as requested. Confirmation received.  

## 2018-01-18 ENCOUNTER — Ambulatory Visit
Admission: RE | Admit: 2018-01-18 | Discharge: 2018-01-18 | Disposition: A | Discharge status: Home / Self Care | Payer: Managed Care, Other (non HMO) | Source: Ambulatory Visit | Attending: Internal Medicine | Admitting: Internal Medicine

## 2018-01-18 DIAGNOSIS — Z1231 Encounter for screening mammogram for malignant neoplasm of breast: Secondary | ICD-10-CM

## 2018-04-05 ENCOUNTER — Ambulatory Visit (INDEPENDENT_AMBULATORY_CARE_PROVIDER_SITE_OTHER): Payer: Managed Care, Other (non HMO) | Admitting: Obstetrics and Gynecology

## 2018-04-05 ENCOUNTER — Encounter: Payer: Self-pay | Admitting: Obstetrics and Gynecology

## 2018-04-05 ENCOUNTER — Other Ambulatory Visit: Payer: Self-pay

## 2018-04-05 VITALS — BP 140/82 | HR 72 | Wt 177.6 lb

## 2018-04-05 DIAGNOSIS — N762 Acute vulvitis: Secondary | ICD-10-CM

## 2018-04-05 DIAGNOSIS — R3915 Urgency of urination: Secondary | ICD-10-CM

## 2018-04-05 DIAGNOSIS — N952 Postmenopausal atrophic vaginitis: Secondary | ICD-10-CM

## 2018-04-05 DIAGNOSIS — N941 Unspecified dyspareunia: Secondary | ICD-10-CM

## 2018-04-05 DIAGNOSIS — R350 Frequency of micturition: Secondary | ICD-10-CM

## 2018-04-05 DIAGNOSIS — N393 Stress incontinence (female) (male): Secondary | ICD-10-CM

## 2018-04-05 LAB — POCT URINALYSIS DIPSTICK
Bilirubin, UA: NEGATIVE
Blood, UA: POSITIVE
GLUCOSE UA: NEGATIVE
Ketones, UA: NEGATIVE
LEUKOCYTES UA: NEGATIVE
NITRITE UA: NEGATIVE
Protein, UA: NEGATIVE
SPEC GRAV UA: 1.01 (ref 1.010–1.025)
Urobilinogen, UA: 0.2 E.U./dL
pH, UA: 5 (ref 5.0–8.0)

## 2018-04-05 MED ORDER — ESTRADIOL 10 MCG VA TABS: 1.0000 | ORAL_TABLET | VAGINAL | 3 refills | Status: DC

## 2018-04-05 MED ORDER — CLOBETASOL PROPIONATE 0.05 % EX OINT: 1.0000 "application " | TOPICAL_OINTMENT | Freq: Two times a day (BID) | CUTANEOUS | 0 refills | Status: DC

## 2018-04-05 NOTE — Patient Instructions (Signed)
Atrophic Vaginitis  Atrophic vaginitis is a condition in which the tissues that line the vagina become dry and thin. This condition is most common in women who have stopped having regular menstrual periods (are in menopause). This usually starts when a woman is 45-65 years old. That is the time when a woman's estrogen levels begin to drop (decrease). Estrogen is a female hormone. It helps to keep the tissues of the vagina moist. It stimulates the vagina to produce a clear fluid that lubricates the vagina for sexual intercourse. This fluid also protects the vagina from infection. Lack of estrogen can cause the lining of the vagina to get thinner and dryer. The vagina may also shrink in size. It may become less elastic. Atrophic vaginitis tends to get worse over time as a woman's estrogen level drops. What are the causes? This condition is caused by the normal drop in estrogen that happens around the time of menopause. What increases the risk? Certain conditions or situations may lower a woman's estrogen level, leading to a higher risk for atrophic vaginitis. You are more likely to develop this condition if:  You are taking medicines that block estrogen.  You have had your ovaries removed.  You are being treated for cancer with X-ray (radiation) or medicines (chemotherapy).  You have given birth or are breastfeeding.  You are older than age 50.  You smoke. What are the signs or symptoms? Symptoms of this condition include:  Pain, soreness, or bleeding during sexual intercourse (dyspareunia).  Vaginal burning, irritation, or itching.  Pain or bleeding when a speculum is used in a vaginal exam (pelvic exam).  Having burning pain when passing urine.  Vaginal discharge that is Bolds or yellow. In some cases, there are no symptoms. How is this diagnosed? This condition is diagnosed by taking a medical history and doing a physical exam. This will include a pelvic exam that checks the  vaginal tissues. Though rare, you may also have other tests, including:  A urine test.  A test that checks the acid balance in your vagina (acid balance test). How is this treated? Treatment for this condition depends on how severe your symptoms are. Treatment may include:  Using an over-the-counter vaginal lubricant before sex.  Using a long-acting vaginal moisturizer.  Using low-dose vaginal estrogen for moderate to severe symptoms that do not respond to other treatments. Options include creams, tablets, and inserts (vaginal rings). Before you use a vaginal estrogen, tell your health care provider if you have a history of: ? Breast cancer. ? Endometrial cancer. ? Blood clots. If you are not sexually active and your symptoms are very mild, you may not need treatment. Follow these instructions at home: Medicines  Take over-the-counter and prescription medicines only as told by your health care provider. Do not use herbal or alternative medicines unless your health care provider says that you can.  Use over-the-counter creams, lubricants, or moisturizers for dryness only as directed by your health care provider. General instructions  If your atrophic vaginitis is caused by menopause, discuss all of your menopause symptoms and treatment options with your health care provider.  Do not douche.  Do not use products that can make your vagina dry. These include: ? Scented feminine sprays. ? Scented tampons. ? Scented soaps.  Vaginal intercourse can help to improve blood flow and elasticity of vaginal tissue. If it hurts to have sex, try using a lubricant or moisturizer just before having intercourse. Contact a health care provider if:    Your discharge looks different than normal.  Your vagina has an unusual smell.  You have new symptoms.  Your symptoms do not improve with treatment.  Your symptoms get worse. Summary  Atrophic vaginitis is a condition in which the tissues that  line the vagina become dry and thin. It is most common in women who have stopped having regular menstrual periods (are in menopause).  Treatment options include using vaginal lubricants and low-dose vaginal estrogen.  Contact a health care provider if your vagina has an unusual smell, or if your symptoms get worse or do not improve after treatment. This information is not intended to replace advice given to you by your health care provider. Make sure you discuss any questions you have with your health care provider. Document Released: 06/20/2014 Document Revised: 10/30/2016 Document Reviewed: 10/30/2016 Elsevier Interactive Patient Education  2019 [employer]. Urinary Incontinence  Urinary incontinence refers to a condition in which a person is unable to control where and when to pass urine. A person with this condition will urinate when he or she does not mean to (involuntarily). What are the causes? This condition may be caused by:  Medicines.  Infections.  Constipation.  Overactive bladder muscles.  Weak bladder muscles.  Weak pelvic floor muscles. These muscles provide support for the bladder, intestine, and, in women, the uterus.  Enlarged prostate in men. The prostate is a gland near the bladder. When it gets too big, it can pinch the urethra. With the urethra blocked, the bladder can weaken and lose the ability to empty properly.  Surgery.  Emotional factors, such as anxiety, stress, or post-traumatic stress disorder (PTSD).  Pelvic organ prolapse. This happens in women when organs shift out of place and into the vagina. This shift can prevent the bladder and urethra from working properly. What increases the risk? The following factors may make you more likely to develop this condition:  Older age.  Obesity and physical inactivity.  Pregnancy and childbirth.  Menopause.  Diseases that affect the nerves or spinal cord (neurological diseases).  Long-term (chronic)  coughing. This can increase pressure on the bladder and pelvic floor muscles. What are the signs or symptoms? Symptoms may vary depending on the type of urinary incontinence you have. They include:  A sudden urge to urinate, but passing urine involuntarily before you can get to a bathroom (urge incontinence).  Suddenly passing urine with any activity that forces urine to pass, such as coughing, laughing, exercise, or sneezing (stress incontinence).  Needing to urinate often, but urinating only a small amount, or constantly dribbling urine (overflow incontinence).  Urinating because you cannot get to the bathroom in time due to a physical disability, such as arthritis or injury, or communication and thinking problems, such as Alzheimer disease (functional incontinence). How is this diagnosed? This condition may be diagnosed based on:  Your medical history.  A physical exam.  Tests, such as: ? Urine tests. ? X-rays of your kidney and bladder. ? Ultrasound. ? CT scan. ? Cystoscopy. In this procedure, a health care provider inserts a tube with a light and camera (cystoscope) through the urethra and into the bladder in order to check for problems. ? Urodynamic testing. These tests assess how well the bladder, urethra, and sphincter can store and release urine. There are different types of urodynamic tests, and they vary depending on what the test is measuring. To help diagnose your condition, your health care provider may recommend that you keep a log of when you  urinate and how much you urinate. How is this treated? Treatment for this condition depends on the type of incontinence that you have and its cause. Treatment may include:  Lifestyle changes, such as: ? Quitting smoking. ? Maintaining a healthy weight. ? Staying active. Try to get 150 minutes of moderate-intensity exercise every week. Ask your health care provider which activities are safe for you. ? Eating a healthy  diet.  Avoid high-fat foods, like fried foods.  Avoid refined carbohydrates like white bread and white rice.  Limit how much alcohol and caffeine you drink.  Increase your fiber intake. Foods such as fresh fruits, vegetables, beans, and whole grains are healthy sources of fiber.  Pelvic floor muscle exercises.  Bladder training, such as lengthening the amount of time between bathroom breaks, or using the bathroom at regular intervals.  Using techniques to suppress bladder urges. This can include distraction techniques or controlled breathing exercises.  Medicines to relax the bladder muscles and prevent bladder spasms.  Medicines to help slow or prevent the growth of a man's prostate.  Botox injections. These can help relax the bladder muscles.  Using pulses of electricity to help change bladder reflexes (electrical nerve stimulation).  For women, using a medical device to prevent urine leaks. This is a small, tampon-like, disposable device that is inserted into the urethra.  Injecting collagen or carbon beads (bulking agents) into the urinary sphincter. These can help thicken tissue and close the bladder opening.  Surgery. Follow these instructions at home: Lifestyle  Limit alcohol and caffeine. These can fill your bladder quickly and irritate it.  Keep yourself clean to help prevent odors and skin damage. Ask your doctor about special skin creams and cleansers that can protect the skin from urine.  Consider wearing pads or adult diapers. Make sure to change them regularly, and always change them right after experiencing incontinence. General instructions  Take over-the-counter and prescription medicines only as told by your health care provider.  Use the bathroom about every 3-4 hours, even if you do not feel the need to urinate. Try to empty your bladder completely every time. After urinating, wait a minute. Then try to urinate again.  Make sure you are in a relaxed  position while urinating.  If your incontinence is caused by nerve problems, keep a log of the medicines you take and the times you go to the bathroom.  Keep all follow-up visits as told by your health care provider. This is important. Contact a health care provider if:  You have pain that gets worse.  Your incontinence gets worse. Get help right away if:  You have a fever or chills.  You are unable to urinate.  You have redness in your groin area or down your legs. Summary  Urinary incontinence refers to a condition in which a person is unable to control where and when to pass urine.  This condition may be caused by medicines, infection, weak bladder muscles, weak pelvic floor muscles, enlargement of the prostate (in men), or surgery.  The following factors increase your risk for developing this condition: older age, obesity, pregnancy and childbirth, menopause, neurological diseases, and chronic coughing.  There are several types of urinary incontinence. They include urge incontinence, stress incontinence, overflow incontinence, and functional incontinence.  This condition is usually treated first with lifestyle and behavioral changes, such as quitting smoking, eating a healthier diet, and doing regular pelvic floor exercises. Other treatment options include medicines, bulking agents, medical devices, electrical nerve stimulation,  or surgery. This information is not intended to replace advice given to you by your health care provider. Make sure you discuss any questions you have with your health care provider. Document Released: 03/13/2004 Document Revised: 05/15/2016 Document Reviewed: 05/15/2016 Elsevier Interactive Patient Education  2019 [employer].

## 2018-04-05 NOTE — Progress Notes (Signed)
GYNECOLOGY  VISIT   HPI: 63 y.o.   Married White or Caucasian Not Hispanic or Latino  female   G2P0020 with Patient's last menstrual period was 02/18/1995.  here for vaginal dryness, urinary frequency, urinary urgency, loss of urine with cough, sneeze and spontaneously. Was treated for UTI by urgent care. Did not complete full course of antibiotics.  She has a h/o the [place] treatment in 3/18. Last year she was given a script for vagifem, didn't take it. She saw a urologist last year. It didn't really help. He offered her medication, she didn't take it. She has urinary urgency, not leaking on the way to the bathroom. Her leakage with coughing and sneezing is unchanged. She leaks small amounts daily. Wears pads when she is out. Gets irritated on her vulva.  She is feeling vulvar/vaginal irritation, worse in the last week. Itchy. No vaginal discharge. Vagina is so dry.  No dysuria currently. She was treated for a UTI last week, didn't finish her antibiotics.  She hasn't been sexually active in 6 months secondary to having pain with intercourse and ED.     GYNECOLOGIC HISTORY: Patient's last menstrual period was 02/18/1995. Contraception:Postmenopausal Menopausal hormone therapy: None        OB History    Gravida  2   Para      Term      Preterm      AB  2   Living  0     SAB  2   TAB      Ectopic      Multiple      Live Births                 Patient Active Problem List   Diagnosis Date Noted  . Genetic testing 06/19/2017  . Family history of breast cancer   . Family history of leukemia   . Family history of stomach cancer   . Rectal bleeding 09/02/2013  . CAD (coronary artery disease) 07/04/2010  . Shoulder pain   . Bruises easily   . Joint pain   . Ischemic heart disease   . Hyperlipidemia     Past Medical History:  Diagnosis Date  . Arthritis   . Benign tumor of adrenal gland   . CAD (coronary artery disease)    Inferior MI 1996 tx with bare  metal stent RCA. Repeat  anterior MI 2002 with LAD vasospasm. Repeat inferior MI 2003 with occlusion RCA with overlapping Cypher DES in RCA. Repeat cath 2003 with profound vasospasm LAd and Circumflex. Repeat cath 2007  with patent RCA and no disease in LAD or Circumflex.  . Depression   . Environmental allergies   . Family history of adverse reaction to anesthesia    mother had ? problem with anesthesia - was alcoholic - "mind was never right again"  . Family history of breast cancer   . Family history of leukemia   . Family history of stomach cancer   . GERD (gastroesophageal reflux disease)   . Headache    sinus headaches  . Heart murmur   . History of stomach ulcers    due to taking aspirin for headaches  . HTN (hypertension)   . Hyperlipidemia   . Myocardial infarction ([place])    times 4 "very minor" stents x2   . Osteopenia   . Osteoporosis   . Rotator cuff tear    left  . Sleep apnea    uses c pap  .  Urinary incontinence     Past Surgical History:  Procedure Laterality Date  . ABDOMINAL HYSTERECTOMY    . BACK SURGERY    . LAPAROSCOPIC LYSIS OF ADHESIONS    . LEFT HEART CATH AND CORONARY ANGIOGRAPHY N/A 11/16/2017   Procedure: LEFT HEART CATH AND CORONARY ANGIOGRAPHY;  Surgeon: Belva Crome, MD;  Location: [facility];  Service: Cardiovascular;  Laterality: N/A;  . MYOMECTOMY     age 62  . PELVIC ABCESS DRAINAGE    . SHOULDER OPEN ROTATOR CUFF REPAIR Left 11/16/2014   Procedure: LEFT MINI OPEN ROTATOR CUFF REPAIR SHOULDER OPEN WITH SUBACROMIAL DECOMPRESSION;  Surgeon: Susa Day, MD;  Location: [facility];  Service: Orthopedics;  Laterality: Left;    Current Outpatient Medications  Medication Sig Dispense Refill  . Acetaminophen-Caffeine (EXCEDRIN ASPIRIN FREE PO) Take 2 tablets by mouth daily.     . Artificial Tear Solution (SOOTHE XP) SOLN Place 1 drop into both eyes daily as needed (dry eyes).    . Calcium Citrate-Vitamin D (CITRACAL + D PO) Take 1 tablet by  mouth 2 (two) times daily.    . clopidogrel (PLAVIX) 75 MG tablet Take 75 mg by mouth daily.    Marland Kitchen diltiazem (CARDIZEM CD) 120 MG 24 hr capsule TAKE 1 CAPSULE BY MOUTH EVERY DAY 90 capsule 3  . diphenhydrAMINE (BENADRYL) 25 MG tablet Take 25-50 mg by mouth daily as needed for allergies.    Marland Kitchen escitalopram (LEXAPRO) 10 MG tablet TAKE 1 TABLET BY MOUTH EVERY DAY 90 tablet 3  . isosorbide mononitrate (IMDUR) 30 MG 24 hr tablet Take 0.5 tablets (15 mg total) by mouth at bedtime. 45 tablet 3  . lisinopril (PRINIVIL,ZESTRIL) 20 MG tablet Take 20 mg by mouth daily.    . metoprolol succinate (TOPROL-XL) 25 MG 24 hr tablet Take 1 tablet (25 mg total) by mouth daily. 90 tablet 3  . montelukast (SINGULAIR) 10 MG tablet Take 10 mg by mouth at bedtime.    . Multiple Vitamins-Minerals (CENTRUM SILVER PO) Take 1 tablet by mouth at bedtime.    . Multiple Vitamins-Minerals (PRESERVISION AREDS 2) CAPS Take 1 capsule by mouth 2 (two) times daily.    . nicotine (NICODERM CQ - DOSED IN MG/24 HOURS) 14 mg/24hr patch Place 14 mg onto the skin daily.    Marland Kitchen omeprazole (PRILOSEC) 20 MG capsule Take 1 capsule (20 mg total) by mouth daily. 90 capsule 3  . Ranibizumab (LUCENTIS IO) Inject into the eye as directed. Eye injections once monthly- unsure of dose    . simvastatin (ZOCOR) 40 MG tablet Take 40 mg by mouth daily.     No current facility-administered medications for this visit.      ALLERGIES: Other; Tomato; and Valium  Family History  Problem Relation Age of Onset  . Alcohol abuse Father   . Lung cancer Father   . Cancer - Lung Father   . Alcohol abuse Mother   . Breast cancer Mother 86  . Breast cancer Maternal Aunt 65  . Breast cancer Maternal Grandmother 34  . Breast cancer Maternal Aunt 72  . Breast cancer Cousin 62  . Leukemia Sister 59  . Stomach cancer Paternal Aunt 44  . Cancer Cousin 91       type unk    Social History   Socioeconomic History  . Marital status: Married    Spouse name: Not  on file  . Number of children: 0  . Years of education: Not on file  .  Highest education level: Not on file  Occupational History    Employer: [employer]  Social Needs  . Financial resource strain: Not on file  . Food insecurity:    Worry: Not on file    Inability: Not on file  . Transportation needs:    Medical: Not on file    Non-medical: Not on file  Tobacco Use  . Smoking status: Former Smoker    Packs/day: 1.00    Types: Cigarettes    Last attempt to quit: 11/14/2011    Years since quitting: 6.3  . Smokeless tobacco: Never Used  . Tobacco comment: currently using e-cigs  Substance and Sexual Activity  . Alcohol use: No  . Drug use: No  . Sexual activity: Not Currently    Partners: Male    Birth control/protection: Surgical    Comment: TAH  Lifestyle  . Physical activity:    Days per week: Not on file    Minutes per session: Not on file  . Stress: Not on file  Relationships  . Social connections:    Talks on phone: Not on file    Gets together: Not on file    Attends religious service: Not on file    Active member of club or organization: Not on file    Attends meetings of clubs or organizations: Not on file    Relationship status: Not on file  . Intimate partner violence:    Fear of current or ex partner: Not on file    Emotionally abused: Not on file    Physically abused: Not on file    Forced sexual activity: Not on file  Other Topics Concern  . Not on file  Social History Narrative  . Not on file    Review of Systems  Constitutional: Negative.   HENT: Negative.   Eyes: Negative.   Respiratory: Negative.   Cardiovascular: Negative.   Gastrointestinal: Negative.   Genitourinary: Positive for frequency and urgency.       Loss of urine with cough, sneeze, and spontaneously Vaginal dryness  Musculoskeletal: Negative.   Skin: Negative.   Neurological: Negative.   Endo/Heme/Allergies: Negative.   Psychiatric/Behavioral: Negative.     PHYSICAL  EXAMINATION:    BP 140/82 (BP Location: Right Arm, Patient Position: Sitting, Cuff Size: Large)   Pulse 72   Wt 177 lb 9.6 oz (80.6 kg)   LMP 02/18/1995   BMI 27.82 kg/m     General appearance: alert, cooperative and appears stated age   Pelvic: External genitalia:  Swollen, white, erythematous, mild agglutination.               Urethra:  normal appearing urethra with no masses, tenderness or lesions              Bartholins and Skenes: normal                 Vagina: very atrophic appearing vagina with erythema              Cervix: absent               Chaperone was present for exam.  ASSESSMENT Severe vulvitis Vaginal atrophy Dyspareunia Urinary frequency and urgency Urinary incontinence, mainly stress, some random    PLAN Affirm sent Clobetasol ointment BID for 2 weeks F/U in 2 weeks Discussed vulvar skin care Will start vaginal estrogen (discussed risks) Urine for ua, c&s Cut out caffeine Consider f/u with pelvic floor PT if urinary symptoms don't improve significantly  with stopping caffeine    An After Visit Summary was printed and given to the patient.  ~15 minutes face to face time of which over 50% was spent in counseling.

## 2018-04-06 LAB — URINALYSIS, MICROSCOPIC ONLY
Bacteria, UA: NONE SEEN
Casts: NONE SEEN /lpf
Epithelial Cells (non renal): NONE SEEN /hpf (ref 0–10)

## 2018-04-06 LAB — VAGINITIS/VAGINOSIS, DNA PROBE
Candida Species: NEGATIVE
GARDNERELLA VAGINALIS: NEGATIVE
Trichomonas vaginosis: NEGATIVE

## 2018-04-07 ENCOUNTER — Telehealth: Payer: Self-pay

## 2018-04-07 LAB — URINE CULTURE

## 2018-04-07 MED ORDER — NITROFURANTOIN MONOHYD MACRO 100 MG PO CAPS: 100.0000 mg | ORAL_CAPSULE | Freq: Two times a day (BID) | ORAL | 0 refills | Status: DC

## 2018-04-07 NOTE — Telephone Encounter (Signed)
Spoke with patient. Advised of results as seen below from [facility]. Patient verbalizes understanding. Rx for Macrobid 100 mg po BID x 5 days #10 0RF sent to pharmacy on file. Encounter closed.

## 2018-04-07 NOTE — Telephone Encounter (Signed)
-----   Message from Donna Dom, MD sent at 04/07/2018  4:57 PM EST ----- Please inform the patient that she does have a UTI and call in Macrobid 100 mg BID x 5 days.

## 2018-04-19 ENCOUNTER — Ambulatory Visit: Payer: Managed Care, Other (non HMO) | Admitting: Obstetrics and Gynecology

## 2018-04-20 NOTE — Progress Notes (Signed)
GYNECOLOGY  VISIT   HPI: 63 y.o.   Married White or Caucasian Not Hispanic or Latino  female   G2P0020 with Patient's last menstrual period was 02/18/1995.   here for vulvitis recheck. Reports she also has a herpes lesion on her buttocks that appeared last night.This occurs once a year per patient.  Has BMD results she would like to review.  She was treated for a severe vulvitis 2 weeks ago, feels better. The ointment helped a lot. She is now washing with baby soap.  She is still a little irritated, but wears pads and has urinary leakage. She leaks daily.   GYNECOLOGIC HISTORY: Patient's last menstrual period was 02/18/1995. Contraception: TAH Menopausal hormone therapy: Estradiol vaginal tablets        OB History    Gravida  2   Para      Term      Preterm      AB  2   Living  0     SAB  2   TAB      Ectopic      Multiple      Live Births                 Patient Active Problem List   Diagnosis Date Noted  . Genetic testing 06/19/2017  . Family history of breast cancer   . Family history of leukemia   . Family history of stomach cancer   . Rectal bleeding 09/02/2013  . CAD (coronary artery disease) 07/04/2010  . Shoulder pain   . Bruises easily   . Joint pain   . Ischemic heart disease   . Hyperlipidemia     Past Medical History:  Diagnosis Date  . Arthritis   . Benign tumor of adrenal gland   . CAD (coronary artery disease)    Inferior MI 1996 tx with bare metal stent RCA. Repeat  anterior MI 2002 with LAD vasospasm. Repeat inferior MI 2003 with occlusion RCA with overlapping Cypher DES in RCA. Repeat cath 2003 with profound vasospasm LAd and Circumflex. Repeat cath 2007  with patent RCA and no disease in LAD or Circumflex.  . Depression   . Environmental allergies   . Family history of adverse reaction to anesthesia    mother had ? problem with anesthesia - was alcoholic - "mind was never right again"  . Family history of breast cancer   .  Family history of leukemia   . Family history of stomach cancer   . GERD (gastroesophageal reflux disease)   . Headache    sinus headaches  . Heart murmur   . History of stomach ulcers    due to taking aspirin for headaches  . HTN (hypertension)   . Hyperlipidemia   . Myocardial infarction ([place])    times 4 "very minor" stents x2   . Osteopenia   . Osteoporosis   . Rotator cuff tear    left  . Sleep apnea    uses c pap  . Urinary incontinence     Past Surgical History:  Procedure Laterality Date  . ABDOMINAL HYSTERECTOMY    . BACK SURGERY    . LAPAROSCOPIC LYSIS OF ADHESIONS    . LEFT HEART CATH AND CORONARY ANGIOGRAPHY N/A 11/16/2017   Procedure: LEFT HEART CATH AND CORONARY ANGIOGRAPHY;  Surgeon: Belva Crome, MD;  Location: [facility];  Service: Cardiovascular;  Laterality: N/A;  . MYOMECTOMY     age 25  . PELVIC  ABCESS DRAINAGE    . SHOULDER OPEN ROTATOR CUFF REPAIR Left 11/16/2014   Procedure: LEFT MINI OPEN ROTATOR CUFF REPAIR SHOULDER OPEN WITH SUBACROMIAL DECOMPRESSION;  Surgeon: Susa Day, MD;  Location: [facility];  Service: Orthopedics;  Laterality: Left;    Current Outpatient Medications  Medication Sig Dispense Refill  . Acetaminophen-Caffeine (EXCEDRIN ASPIRIN FREE PO) Take 2 tablets by mouth daily.     . Artificial Tear Solution (SOOTHE XP) SOLN Place 1 drop into both eyes daily as needed (dry eyes).    . Calcium Citrate-Vitamin D (CITRACAL + D PO) Take 1 tablet by mouth 2 (two) times daily.    . clobetasol ointment (TEMOVATE) 2.40 % Apply 1 application topically 2 (two) times daily. Apply as directed twice daily 30 g 0  . clopidogrel (PLAVIX) 75 MG tablet Take 75 mg by mouth daily.    Marland Kitchen diltiazem (CARDIZEM CD) 120 MG 24 hr capsule TAKE 1 CAPSULE BY MOUTH EVERY DAY 90 capsule 3  . diphenhydrAMINE (BENADRYL) 25 MG tablet Take 25-50 mg by mouth daily as needed for allergies.    Marland Kitchen escitalopram (LEXAPRO) 10 MG tablet TAKE 1 TABLET BY MOUTH EVERY DAY 90  tablet 3  . Estradiol 10 MCG TABS vaginal tablet Place 1 tablet (10 mcg total) vaginally 2 (two) times a week. 24 tablet 3  . isosorbide mononitrate (IMDUR) 30 MG 24 hr tablet Take 0.5 tablets (15 mg total) by mouth at bedtime. 45 tablet 3  . lisinopril (PRINIVIL,ZESTRIL) 20 MG tablet Take 20 mg by mouth daily.    . metoprolol succinate (TOPROL-XL) 25 MG 24 hr tablet Take 1 tablet (25 mg total) by mouth daily. 90 tablet 3  . montelukast (SINGULAIR) 10 MG tablet Take 10 mg by mouth at bedtime.    . Multiple Vitamins-Minerals (CENTRUM SILVER PO) Take 1 tablet by mouth at bedtime.    . Multiple Vitamins-Minerals (PRESERVISION AREDS 2) CAPS Take 1 capsule by mouth 2 (two) times daily.    . nicotine (NICODERM CQ - DOSED IN MG/24 HOURS) 14 mg/24hr patch Place 14 mg onto the skin daily.    Marland Kitchen omeprazole (PRILOSEC) 20 MG capsule Take 1 capsule (20 mg total) by mouth daily. 90 capsule 3  . Ranibizumab (LUCENTIS IO) Inject into the eye as directed. Eye injections once monthly- unsure of dose    . simvastatin (ZOCOR) 40 MG tablet Take 40 mg by mouth daily.     No current facility-administered medications for this visit.      ALLERGIES: Other; Tomato; and Valium  Family History  Problem Relation Age of Onset  . Alcohol abuse Father   . Lung cancer Father   . Cancer - Lung Father   . Alcohol abuse Mother   . Breast cancer Mother 81  . Breast cancer Maternal Aunt 65  . Breast cancer Maternal Grandmother 34  . Breast cancer Maternal Aunt 72  . Breast cancer Cousin 42  . Leukemia Sister 21  . Stomach cancer Paternal Aunt 100  . Cancer Cousin 53       type unk    Social History   Socioeconomic History  . Marital status: Married    Spouse name: Not on file  . Number of children: 0  . Years of education: Not on file  . Highest education level: Not on file  Occupational History    Employer: [employer]  Social Needs  . Financial resource strain: Not on file  . Food insecurity:    Worry:  Not  on file    Inability: Not on file  . Transportation needs:    Medical: Not on file    Non-medical: Not on file  Tobacco Use  . Smoking status: Former Smoker    Packs/day: 1.00    Types: Cigarettes    Last attempt to quit: 11/14/2011    Years since quitting: 6.4  . Smokeless tobacco: Never Used  . Tobacco comment: currently using e-cigs  Substance and Sexual Activity  . Alcohol use: No  . Drug use: No  . Sexual activity: Not Currently    Partners: Male    Birth control/protection: Surgical    Comment: TAH  Lifestyle  . Physical activity:    Days per week: Not on file    Minutes per session: Not on file  . Stress: Not on file  Relationships  . Social connections:    Talks on phone: Not on file    Gets together: Not on file    Attends religious service: Not on file    Active member of club or organization: Not on file    Attends meetings of clubs or organizations: Not on file    Relationship status: Not on file  . Intimate partner violence:    Fear of current or ex partner: Not on file    Emotionally abused: Not on file    Physically abused: Not on file    Forced sexual activity: Not on file  Other Topics Concern  . Not on file  Social History Narrative  . Not on file    Review of Systems  Constitutional: Negative.   HENT: Negative.   Eyes: Negative.   Respiratory: Negative.   Cardiovascular: Negative.   Gastrointestinal: Negative.   Genitourinary:       Sore on buttocks Vaginal irritation  Musculoskeletal: Negative.   Skin: Negative.   Neurological: Negative.   Endo/Heme/Allergies: Negative.   Psychiatric/Behavioral: Negative.     PHYSICAL EXAMINATION:    BP 122/80 (BP Location: Right Arm, Patient Position: Sitting, Cuff Size: Normal)   Pulse 64   Wt 177 lb (80.3 kg)   LMP 02/18/1995   BMI 27.72 kg/m     General appearance: alert, cooperative and appears stated age   Pelvic: External genitalia:  no lesions, whitening, mild agglutination,  minimal erythema. Much improved              Chaperone was present for exam.  ASSESSMENT Chronic vulvitis, much improved with steroid ointment Mixed urinary incontinence Bone density. The patient brought in her report from her primary MD. Report states normal. T score in left femur of -1.8    PLAN Use the steroid ointment 2 x a week Use Vaseline daily Change pad as soon as they are wet Referral to PT for incontinence Will try and get more information on her DEXA.   An After Visit Summary was printed and given to the patient.  ~15 minutes face to face time of which over 50% was spent in counseling.

## 2018-04-22 ENCOUNTER — Ambulatory Visit (INDEPENDENT_AMBULATORY_CARE_PROVIDER_SITE_OTHER): Payer: Managed Care, Other (non HMO) | Admitting: Obstetrics and Gynecology

## 2018-04-22 ENCOUNTER — Other Ambulatory Visit: Payer: Self-pay

## 2018-04-22 ENCOUNTER — Encounter: Payer: Self-pay | Admitting: Obstetrics and Gynecology

## 2018-04-22 ENCOUNTER — Other Ambulatory Visit: Payer: Self-pay | Admitting: *Deleted

## 2018-04-22 VITALS — BP 122/80 | HR 64 | Wt 177.0 lb

## 2018-04-22 DIAGNOSIS — N763 Subacute and chronic vulvitis: Secondary | ICD-10-CM

## 2018-04-22 DIAGNOSIS — N3946 Mixed incontinence: Secondary | ICD-10-CM

## 2018-04-22 MED ORDER — VALACYCLOVIR HCL 500 MG PO TABS: ORAL_TABLET | ORAL | 1 refills | Status: DC

## 2018-05-24 ENCOUNTER — Telehealth: Payer: Self-pay | Admitting: *Deleted

## 2018-05-24 NOTE — Telephone Encounter (Signed)
S/w with pt is aware of details for upcoming visit on 4/8.

## 2018-05-25 NOTE — Progress Notes (Signed)
Telehealth Visit     Virtual Visit via Video Note   This visit type was conducted due to national recommendations for restrictions regarding the COVID-19 Pandemic (e.g. social distancing) in an effort to limit this patient's exposure and mitigate transmission in our community.  Due to her co-morbid illnesses, this patient is at least at moderate risk for complications without adequate follow up.  This format is felt to be most appropriate for this patient at this time.  All issues noted in this document were discussed and addressed.  A limited physical exam was performed with this format.  Please refer to the patient's chart for her consent to telehealth for [facility].   Evaluation Performed:  Follow-up visit  This visit type was conducted due to national recommendations for restrictions regarding the COVID-19 Pandemic (e.g. social distancing).  This format is felt to be most appropriate for this patient at this time.  All issues noted in this document were discussed and addressed.  No physical exam was performed (except for noted visual exam findings with Video Visits).  Please refer to the patient's chart (MyChart message for video visits and phone note for telephone visits) for the patient's consent to telehealth for [facility].  Date:  05/26/2018   ID:  Donna Bullock, DOB 1955/05/24, MRN [id number]  Patient Location:  Home  Provider location:   Home  PCP:  Donna Gravel, MD  Cardiologist:  Servando Snare & No primary care provider on file.  Electrophysiologist:  None   Chief Complaint:  Follow up visit.   History of Present Illness:    Donna Bullock is a 63 y.o. female who presents via audio/video conferencing for a telehealth visit today.  Seen for Dr. Angelena Bullock. She is a former patient of Dr. Susa Bullock that I previously cared for. She primarily follows with me.   She has a history of CAD, HLD, HTN, and tobacco abuse. Her CAD dates back to 1996 when she had a stent placed in  her RCA. She had an anterior MI with cardiac arrest (VFib) in 2002. Cath showed a 75% distal LAD narrowing and this was managed conservatively with relook cath two days later showing 50% distal stenosis. She then had an inferior MI in 2003 with placement of overlapping Cypher drug eluting stents in the RCA. The LAD and Circumflex had mild disease per report. Four days later, she had severe chest pain, cath showed severe spasm of the LAD and Circumflex which resolved with IC vasodilators. Her last cath was in 2007 and showed patency of stents in the RCA and no other obstructive disease in the LAD and Circumflex.Remotestress test was in November 2009 and showed normal LVEF with no evidence of ischemia. She was seen in the ED 09/02/13 with bright red blood per rectum. Her ASA and Plavix were held. Colonoscopy 09/09/13 per Dr. Collene Mares with polyps removed but no other bleeding source identified. Aspirin wasstopped but shehas continued to useExcedrin Migraine.   Her other issues include RA, HLD, HTN and past tobacco abuse along with depression/anxiety.  I had not seen her in 5 1/2 years until August of 2016. She had since gotten married -toMark [place] Bullock who I also see.She had stopped smoking. Some vague symptoms noted - did not want to have stress testing but then needed shoulder surgery and I got her Myoview updated -no ischemia but EF a little down - better by echo from 2018 - she has been managed medically since.She has seen Dr. Lovena Le for palpitations. She  ended up having repeat cath back in 10/2017 (last study had been 12 years prior) due to chest pain - this was reassuring. Her prior anginal equivalent has never been typical - she presented once with a VF arrest. Her vasospasm was more of a choking sensation and she has SCAD twice.   Last seen by me back in October of 2019 - some stress with lots of death in her family but otherwise doing ok.   The patient does not have symptoms concerning for COVID-19  infection (fever, chills, cough, or new shortness of breath).   Seen today via video/web ex. She has consented to this visit. She is doing well. She and Donna Bullock both are staying in. She feels good. No chest pain. Breathing is fine. Tolerating her medicines. She has been getting her shots for her eyes. BP is good. She has no real concerns.   Past Medical History:  Diagnosis Date   Arthritis    Benign tumor of adrenal gland    CAD (coronary artery disease)    Inferior MI 1996 tx with bare metal stent RCA. Repeat  anterior MI 2002 with LAD vasospasm. Repeat inferior MI 2003 with occlusion RCA with overlapping Cypher DES in RCA. Repeat cath 2003 with profound vasospasm LAd and Circumflex. Repeat cath 2007  with patent RCA and no disease in LAD or Circumflex.   Depression    Environmental allergies    Family history of adverse reaction to anesthesia    mother had ? problem with anesthesia - was alcoholic - "mind was never right again"   Family history of breast cancer    Family history of leukemia    Family history of stomach cancer    GERD (gastroesophageal reflux disease)    Headache    sinus headaches   Heart murmur    History of stomach ulcers    due to taking aspirin for headaches   HTN (hypertension)    Hyperlipidemia    Myocardial infarction ([place])    times 4 "very minor" stents x2    Osteopenia    Osteoporosis    Rotator cuff tear    left   Sleep apnea    uses c pap   Urinary incontinence    Past Surgical History:  Procedure Laterality Date   ABDOMINAL HYSTERECTOMY     BACK SURGERY     LAPAROSCOPIC LYSIS OF ADHESIONS     LEFT HEART CATH AND CORONARY ANGIOGRAPHY N/A 11/16/2017   Procedure: LEFT HEART CATH AND CORONARY ANGIOGRAPHY;  Surgeon: Belva Crome, MD;  Location: [facility];  Service: Cardiovascular;  Laterality: N/A;   MYOMECTOMY     age 66   PELVIC ABCESS DRAINAGE     SHOULDER OPEN ROTATOR CUFF REPAIR Left 11/16/2014   Procedure:  LEFT MINI OPEN ROTATOR CUFF REPAIR SHOULDER OPEN WITH SUBACROMIAL DECOMPRESSION;  Surgeon: Donna Day, MD;  Location: [facility];  Service: Orthopedics;  Laterality: Left;     Current Meds  Medication Sig   Acetaminophen-Caffeine (EXCEDRIN ASPIRIN FREE PO) Take 2 tablets by mouth daily.    Artificial Tear Solution (SOOTHE XP) SOLN Place 1 drop into both eyes daily as needed (dry eyes).   Calcium Citrate-Vitamin D (CITRACAL + D PO) Take 1 tablet by mouth 2 (two) times daily.   clobetasol ointment (TEMOVATE) 0.35 % Apply 1 application topically 2 (two) times daily. Apply as directed twice daily   clopidogrel (PLAVIX) 75 MG tablet Take 75 mg by mouth daily.   diltiazem (  CARDIZEM CD) 120 MG 24 hr capsule TAKE 1 CAPSULE BY MOUTH EVERY DAY   diphenhydrAMINE (BENADRYL) 25 MG tablet Take 25-50 mg by mouth daily as needed for allergies.   escitalopram (LEXAPRO) 10 MG tablet TAKE 1 TABLET BY MOUTH EVERY DAY   isosorbide mononitrate (IMDUR) 30 MG 24 hr tablet Take 0.5 tablets (15 mg total) by mouth at bedtime.   lisinopril (PRINIVIL,ZESTRIL) 20 MG tablet Take 20 mg by mouth daily.   metoprolol succinate (TOPROL-XL) 25 MG 24 hr tablet Take 1 tablet (25 mg total) by mouth daily.   montelukast (SINGULAIR) 10 MG tablet Take 10 mg by mouth at bedtime.   Multiple Vitamins-Minerals (CENTRUM SILVER PO) Take 1 tablet by mouth at bedtime.   Multiple Vitamins-Minerals (PRESERVISION AREDS 2) CAPS Take 1 capsule by mouth 2 (two) times daily.   omeprazole (PRILOSEC) 20 MG capsule Take 1 capsule (20 mg total) by mouth daily.   Ranibizumab (LUCENTIS IO) Inject into the eye as directed. Eye injections once monthly- unsure of dose   simvastatin (ZOCOR) 40 MG tablet Take 40 mg by mouth daily.   valACYclovir (VALTREX) 500 MG tablet Take one tablet BID x 3 days prn.   [DISCONTINUED] Estradiol 10 MCG TABS vaginal tablet Place 1 tablet (10 mcg total) vaginally 2 (two) times a week.     Allergies:   Other;  Tomato; and Valium   Social History   Tobacco Use   Smoking status: Former Smoker    Packs/day: 1.00    Types: Cigarettes    Last attempt to quit: 11/14/2011    Years since quitting: 6.5   Smokeless tobacco: Never Used   Tobacco comment: currently using e-cigs  Substance Use Topics   Alcohol use: No   Drug use: No     Family Hx: The patient's family history includes Alcohol abuse in her father and mother; Breast cancer (age of onset: 13) in her maternal grandmother; Breast cancer (age of onset: 65) in her cousin; Breast cancer (age of onset: 72) in her maternal aunt; Breast cancer (age of onset: 108) in her mother; Breast cancer (age of onset: 69) in her maternal aunt; Cancer (age of onset: 14) in her cousin; Cancer - Lung in her father; Leukemia (age of onset: 11) in her sister; Lung cancer in her father; Stomach cancer (age of onset: 17) in her paternal aunt.  ROS:   Please see the history of present illness.   All other systems reviewed are negative except for none.    Objective:    Vital Signs:  BP 120/70    Ht 5\' 7"  (1.702 m)    Wt 173 lb (78.5 kg)    LMP 02/18/1995    BMI 27.10 kg/m    Wt Readings from Last 3 Encounters:  05/26/18 173 lb (78.5 kg)  04/22/18 177 lb (80.3 kg)  04/05/18 177 lb 9.6 oz (80.6 kg)    Alert and in acute distress. She is not short of breath. Her responses are appropriate.    Labs/Other Tests and Data Reviewed:    Lab Results  Component Value Date   WBC 10.4 11/10/2017   HGB 14.3 11/10/2017   HCT 43.8 11/10/2017   PLT 305 11/10/2017   GLUCOSE 90 11/10/2017   CHOL 145 03/20/2017   TRIG 184 (H) 03/20/2017   HDL 50 03/20/2017   LDLCALC 58 03/20/2017   ALT 16 11/10/2017   AST 22 11/10/2017   NA 144 11/10/2017   K 4.5 11/10/2017   CL  103 11/10/2017   CREATININE 0.76 11/10/2017   BUN 13 11/10/2017   CO2 22 11/10/2017   TSH 1.190 03/09/2017   INR 0.90 09/02/2013     BNP (last 3 results) No results for input(s): BNP in the last  8760 hours.  ProBNP (last 3 results) No results for input(s): PROBNP in the last 8760 hours.       Prior CV studies:    The following studies were reviewed today:  LEFT HEART CATH AND CORONARY ANGIOGRAPHY 11/16/2017  Conclusion    Review of prior images demonstrates distal LAD SCAD in December 2002 that healed spontaneously. 10 months later, October 2003, SCAD in distal RCA treated with stenting.  Left main is normal  LAD is large and wraps around the apex. LAD is normal and previous site of SCAD 15 years ago is normal.  Circumflex coronary artery is normal.  RCA is widely patent. Segmental 30% proximal to mid atherosclerotic disease. Mid to distal RCA stents are widely patent. The stents are in the distribution of previous SCAD October 2003.  Inferobasal akinesis. EF 45 to 50%. Normal LVEDP.  RECOMMENDATIONS:   Continue aggressive primary prevention/risk factor modification: Monitor glycemic control, blood pressure less than 130/80 mmHg, LDL less than 70, smoking cessation, and at least 150 minutes of moderate aerobic activity per week  Current symptoms not felt to represent ischemia/obstructive CAD.  Recommend Aspirin 81mg  daily for moderate CAD.   EchoStudy Conclusions8/2018  - Left ventricle: The cavity size was mildly dilated. Systolic function was normal. The estimated ejection fraction was in the range of 55% to 60%. Wall motion was normal; there were no regional wall motion abnormalities. Features are consistent with a pseudonormal left ventricular filling pattern, with concomitant abnormal relaxation and increased filling pressure (grade 2 diastolic dysfunction). Doppler parameters are consistent with high ventricular filling pressure. - Aortic valve: There was mild regurgitation. - Mitral valve: There was trivial regurgitation.   48 HR HolterStudy Highlights6/2018  Sinus rhythm with sinus bradycardia. Lowest heart rate  49 bpm at 4am.  Frequent premature ventricular contractions (760 during monitoring period) with several episodes of bigeminy.  Rare premature atrial contractions    ASSESSMENT & PLAN:    1. CAD with remote MI, history of prior SCAD and vasospasm, remote PCI to the distal RCA - last cath from 10/2017 with reassuring data - no symptoms noted.   2. HTN -  BP looks great - she will continue to monitor. No changes made today.   3. Palpitations - not endorsed.   4. Former smoker  5. HLD - on statin. Will need labs on return visit.   6. Prior LV dysfunction - EF is 45 to 50% by cath 10/2017 - does not seem symptomatic.   7. COVID-19 Education: The signs and symptoms of COVID-19 were discussed with the patient and how to seek care for testing (follow up with PCP or arrange E-visit).  The importance of social distancing, staying at home and hand hygiene were discussed today.  Patient Risk:   After full review of this patient's clinical status, I feel that they are at least moderate risk at this time.  Time:   Today, I have spent 15 minutes with the patient with telehealth technology discussing the above issues.     Medication Adjustments/Labs and Tests Ordered: Current medicines are reviewed at length with the patient today.  Concerns regarding medicines are outlined above.   Tests Ordered: No orders of the defined types were placed in this  encounter.   Medication Changes: No orders of the defined types were placed in this encounter.   Disposition:  FU with me in August with fasting labs.     Patient is agreeable to this plan and will call if any problems develop in the interim.   Amie Critchley, NP  05/26/2018 3:03 PM    [facility]

## 2018-05-26 ENCOUNTER — Telehealth (INDEPENDENT_AMBULATORY_CARE_PROVIDER_SITE_OTHER): Payer: Managed Care, Other (non HMO) | Admitting: Nurse Practitioner

## 2018-05-26 ENCOUNTER — Other Ambulatory Visit: Payer: Self-pay

## 2018-05-26 ENCOUNTER — Encounter: Payer: Self-pay | Admitting: Nurse Practitioner

## 2018-05-26 VITALS — BP 120/70 | Ht 67.0 in | Wt 173.0 lb

## 2018-05-26 DIAGNOSIS — I10 Essential (primary) hypertension: Secondary | ICD-10-CM | POA: Diagnosis not present

## 2018-05-26 DIAGNOSIS — R002 Palpitations: Secondary | ICD-10-CM | POA: Diagnosis not present

## 2018-05-26 DIAGNOSIS — Z79899 Other long term (current) drug therapy: Secondary | ICD-10-CM

## 2018-05-26 DIAGNOSIS — I259 Chronic ischemic heart disease, unspecified: Secondary | ICD-10-CM

## 2018-05-26 DIAGNOSIS — Z87891 Personal history of nicotine dependence: Secondary | ICD-10-CM

## 2018-05-26 DIAGNOSIS — E785 Hyperlipidemia, unspecified: Secondary | ICD-10-CM

## 2018-05-26 DIAGNOSIS — Z7189 Other specified counseling: Secondary | ICD-10-CM

## 2018-05-26 NOTE — Patient Instructions (Addendum)
After Visit Summary:  We will be checking the following labs today - none   Medication Instructions:    Continue with your current medicines.    If you need a refill on your cardiac medications before your next appointment, please call your pharmacy.     Testing/Procedures To Be Arranged:  N/A  Follow-Up:   See me in August with fasting labs.     At [facility], you and your health needs are our priority.  As part of our continuing mission to provide you with exceptional heart care, we have created designated Provider Care Teams.  These Care Teams include your primary Cardiologist (physician) and Advanced Practice Providers (APPs -  Physician Assistants and Nurse Practitioners) who all work together to provide you with the care you need, when you need it.  Special Instructions:  . Stay safe, stay home and wash your hands for at least 20 seconds! . It was so good to see you (and [name]) today!  Call the [place] office at [phone] if you have any questions, problems or concerns.

## 2018-07-28 ENCOUNTER — Other Ambulatory Visit: Payer: Self-pay | Admitting: Nurse Practitioner

## 2018-07-28 DIAGNOSIS — I259 Chronic ischemic heart disease, unspecified: Secondary | ICD-10-CM

## 2018-07-28 DIAGNOSIS — I10 Essential (primary) hypertension: Secondary | ICD-10-CM

## 2018-07-28 DIAGNOSIS — Z79899 Other long term (current) drug therapy: Secondary | ICD-10-CM

## 2018-07-28 NOTE — Progress Notes (Signed)
Husband seen for VT visit today - both are wanting labs.  Orders placed.   Burtis Junes, RN, [place] [address] [place], [place]  [id number] [phone]

## 2018-08-02 ENCOUNTER — Other Ambulatory Visit: Payer: Self-pay

## 2018-08-02 ENCOUNTER — Other Ambulatory Visit: Payer: Managed Care, Other (non HMO) | Admitting: *Deleted

## 2018-08-02 DIAGNOSIS — I10 Essential (primary) hypertension: Secondary | ICD-10-CM

## 2018-08-02 DIAGNOSIS — I259 Chronic ischemic heart disease, unspecified: Secondary | ICD-10-CM

## 2018-08-02 DIAGNOSIS — Z79899 Other long term (current) drug therapy: Secondary | ICD-10-CM

## 2018-08-02 LAB — CBC
Hematocrit: 44.5 % (ref 34.0–46.6)
Hemoglobin: 14.9 g/dL (ref 11.1–15.9)
MCH: 30.3 pg (ref 26.6–33.0)
MCHC: 33.5 g/dL (ref 31.5–35.7)
MCV: 90 fL (ref 79–97)
Platelets: 271 10*3/uL (ref 150–450)
RBC: 4.92 x10E6/uL (ref 3.77–5.28)
RDW: 14.2 % (ref 11.7–15.4)
WBC: 8.9 10*3/uL (ref 3.4–10.8)

## 2018-08-02 LAB — BASIC METABOLIC PANEL
BUN/Creatinine Ratio: 16 (ref 12–28)
BUN: 13 mg/dL (ref 8–27)
CO2: 23 mmol/L (ref 20–29)
Calcium: 9.5 mg/dL (ref 8.7–10.3)
Chloride: 104 mmol/L (ref 96–106)
Creatinine, Ser: 0.8 mg/dL (ref 0.57–1.00)
GFR calc Af Amer: 90 mL/min/{1.73_m2} (ref >59)
GFR calc non Af Amer: 78 mL/min/{1.73_m2} (ref >59)
Glucose: 93 mg/dL (ref 65–99)
Potassium: 3.8 mmol/L (ref 3.5–5.2)
Sodium: 142 mmol/L (ref 134–144)

## 2018-08-02 LAB — LIPID PANEL
Chol/HDL Ratio: 3.7 ratio (ref 0.0–4.4)
Cholesterol, Total: 149 mg/dL (ref 100–199)
HDL: 40 mg/dL (ref >39)
LDL Calculated: 59 mg/dL (ref 0–99)
Triglycerides: 251 mg/dL — ABNORMAL HIGH (ref 0–149)
VLDL Cholesterol Cal: 50 mg/dL — ABNORMAL HIGH (ref 5–40)

## 2018-08-02 LAB — HEPATIC FUNCTION PANEL
ALT: 16 IU/L (ref 0–32)
AST: 20 IU/L (ref 0–40)
Albumin: 4.1 g/dL (ref 3.8–4.8)
Alkaline Phosphatase: 92 IU/L (ref 39–117)
Bilirubin Total: 0.4 mg/dL (ref 0.0–1.2)
Bilirubin, Direct: 0.12 mg/dL (ref 0.00–0.40)
Total Protein: 6.3 g/dL (ref 6.0–8.5)

## 2018-08-04 ENCOUNTER — Other Ambulatory Visit: Payer: Self-pay | Admitting: Nurse Practitioner

## 2018-08-04 NOTE — Telephone Encounter (Signed)
Donna Bullock filled this last year. Didn't know if she would approve this again?

## 2018-09-06 ENCOUNTER — Other Ambulatory Visit: Payer: Self-pay | Admitting: Nurse Practitioner

## 2018-09-06 MED ORDER — DILTIAZEM HCL ER COATED BEADS 120 MG PO CP24: ORAL_CAPSULE | ORAL | 3 refills | Status: DC

## 2018-09-06 MED ORDER — LISINOPRIL 20 MG PO TABS: 20.0000 mg | ORAL_TABLET | Freq: Every day | ORAL | 3 refills | Status: DC

## 2018-09-06 MED ORDER — CLOPIDOGREL BISULFATE 75 MG PO TABS: 75.0000 mg | ORAL_TABLET | Freq: Every day | ORAL | 3 refills | Status: DC

## 2018-09-06 MED ORDER — ESCITALOPRAM OXALATE 10 MG PO TABS: 10.0000 mg | ORAL_TABLET | Freq: Every day | ORAL | 3 refills | Status: DC

## 2018-09-06 MED ORDER — ISOSORBIDE MONONITRATE ER 30 MG PO TB24: 15.0000 mg | ORAL_TABLET | Freq: Every day | ORAL | 3 refills | Status: DC

## 2018-09-06 MED ORDER — METOPROLOL SUCCINATE ER 25 MG PO TB24: 25.0000 mg | ORAL_TABLET | Freq: Every day | ORAL | 3 refills | Status: DC

## 2018-09-06 MED ORDER — SIMVASTATIN 40 MG PO TABS: 40.0000 mg | ORAL_TABLET | Freq: Every day | ORAL | 3 refills | Status: DC

## 2018-09-06 NOTE — Addendum Note (Signed)
Addended by: Derl Barrow on: 09/06/2018 12:25 PM   Modules accepted: Orders

## 2018-09-06 NOTE — Telephone Encounter (Signed)
Pt changed mail order pharmacy to [facility]. I resent pt's medications to the requested pharmacy. Confirmation received.

## 2018-09-28 ENCOUNTER — Other Ambulatory Visit: Payer: Managed Care, Other (non HMO)

## 2018-10-01 NOTE — Progress Notes (Signed)
CARDIOLOGY OFFICE NOTE  Date:  10/04/2018    [name] Date of Birth: [birth year] Medical Record #[id number]  PCP:  Jani Gravel, MD  Cardiologist:  Aldona Bar  Chief Complaint  Patient presents with  . Follow-up    History of Present Illness: SHANTY GINTY is a 63 y.o. female who presents today for a 4 month check. Seen for Dr. [name]. She is a former patient of Dr. Susa Simmonds that I previously cared for.She primarily follows with me.  She has a history of CAD, HLD, HTN, and tobacco abuse. Her CAD dates back to 1996 when she had a stent placed in her RCA. She had an anterior MI with cardiac arrest (VFib) in 2002. Cath showed a 75% distal LAD narrowing and this was managed conservatively with relook cath two days later showing 50% distal stenosis. She then had an inferior MI in 2003 with placement of overlapping Cypher drug eluting stents in the RCA. The LAD and Circumflex had mild disease per report. Four days later, she had severe chest pain, cath showed severe spasm of the LAD and Circumflex which resolved with IC vasodilators. Her last cath was in 2007 and showed patency of stents in the RCA and no other obstructive disease in the LAD and Circumflex.Remotestress test was in November 2009 and showed normal LVEF with no evidence of ischemia. She was seen in the ED 09/02/13 with bright red blood per rectum. Her ASA and Plavix were held. Colonoscopy 09/09/13 per Dr. Collene Mares with polyps removed but no other bleeding source identified. Aspirin wasstopped but shehas continued to useExcedrin Migraine.   Her other issues include RA, HLD, HTN and past tobacco abuse along with depression/anxiety.  I had not seen her in 5 1/2 years until August of 2016. She had since gotten married -to[place] who I also see.She had stopped smoking. Some vague symptoms noted - did not want to have stress testing but then needed shoulder surgery and I got her Myoview updated -no  ischemia but EF a little down - better by echo from 2018 - she has been managed medically since.She has seen Dr. Lovena Le for palpitations. She ended up having repeat cath back in 10/2017 (last study had been 12 years prior) due to chest pain - this was reassuring. Her prior anginal equivalent has never been typical - she presented once with a VF arrest. Her vasospasm was more of a choking sensation and she has SCAD twice.   Last seen by me by telehealth visit in April - she was doing ok - was staying home - had had some eye injections. Cardiac status was ok.   The patient does not have symptoms concerning for COVID-19 infection (fever, chills, cough, or new shortness of breath).   Comes in today. Here alone. She is doing well. No chest pain. Breathing is good. She has cut her calories back and has lost weight. She feels like she is doing good. PCP wanted to add fish oil - she is trying to do this more with diet/continued weight loss. She feels like she is doing ok and has no real concerns.   Past Medical History:  Diagnosis Date  . Arthritis   . Benign tumor of adrenal gland   . CAD (coronary artery disease)    Inferior MI 1996 tx with bare metal stent RCA. Repeat  anterior MI 2002 with LAD vasospasm. Repeat inferior MI 2003 with occlusion RCA with overlapping Cypher DES in RCA. Repeat cath  2003 with profound vasospasm LAd and Circumflex. Repeat cath 2007  with patent RCA and no disease in LAD or Circumflex.  . Depression   . Environmental allergies   . Family history of adverse reaction to anesthesia    mother had ? problem with anesthesia - was alcoholic - "mind was never right again"  . Family history of breast cancer   . Family history of leukemia   . Family history of stomach cancer   . GERD (gastroesophageal reflux disease)   . Headache    sinus headaches  . Heart murmur   . History of stomach ulcers    due to taking aspirin for headaches  . HTN (hypertension)   . Hyperlipidemia    . Myocardial infarction ([facility])    times 4 "very minor" stents x2   . Osteopenia   . Osteoporosis   . Rotator cuff tear    left  . Sleep apnea    uses c pap  . Urinary incontinence     Past Surgical History:  Procedure Laterality Date  . ABDOMINAL HYSTERECTOMY    . BACK SURGERY    . LAPAROSCOPIC LYSIS OF ADHESIONS    . LEFT HEART CATH AND CORONARY ANGIOGRAPHY N/A 11/16/2017   Procedure: LEFT HEART CATH AND CORONARY ANGIOGRAPHY;  Surgeon: Belva Crome, MD;  Location: [facility];  Service: Cardiovascular;  Laterality: N/A;  . MYOMECTOMY     age 23  . PELVIC ABCESS DRAINAGE    . SHOULDER OPEN ROTATOR CUFF REPAIR Left 11/16/2014   Procedure: LEFT MINI OPEN ROTATOR CUFF REPAIR SHOULDER OPEN WITH SUBACROMIAL DECOMPRESSION;  Surgeon: Susa Day, MD;  Location: [facility];  Service: Orthopedics;  Laterality: Left;     Medications: Current Meds  Medication Sig  . Acetaminophen-Caffeine (EXCEDRIN ASPIRIN FREE PO) Take 2 tablets by mouth daily.   . Artificial Tear Solution (SOOTHE XP) SOLN Place 1 drop into both eyes daily as needed (dry eyes).  . Calcium Citrate-Vitamin D (CITRACAL + D PO) Take 1 tablet by mouth 2 (two) times daily.  . clobetasol ointment (TEMOVATE) 5.40 % Apply 1 application topically 2 (two) times daily. Apply as directed twice daily  . clopidogrel (PLAVIX) 75 MG tablet Take 1 tablet (75 mg total) by mouth daily.  Marland Kitchen diltiazem (CARDIZEM CD) 120 MG 24 hr capsule TAKE 1 CAPSULE BY MOUTH EVERY DAY  . diphenhydrAMINE (BENADRYL) 25 MG tablet Take 25-50 mg by mouth daily as needed for allergies.  Marland Kitchen escitalopram (LEXAPRO) 10 MG tablet Take 1 tablet (10 mg total) by mouth daily.  . isosorbide mononitrate (IMDUR) 30 MG 24 hr tablet Take 0.5 tablets (15 mg total) by mouth at bedtime.  Marland Kitchen lisinopril (ZESTRIL) 20 MG tablet Take 1 tablet (20 mg total) by mouth daily.  . metoprolol succinate (TOPROL-XL) 25 MG 24 hr tablet Take 1 tablet (25 mg total) by mouth daily.  .  montelukast (SINGULAIR) 10 MG tablet Take 10 mg by mouth at bedtime.  . Multiple Vitamins-Minerals (CENTRUM SILVER PO) Take 1 tablet by mouth at bedtime.  . Multiple Vitamins-Minerals (PRESERVISION AREDS 2) CAPS Take 1 capsule by mouth 2 (two) times daily.  Marland Kitchen omeprazole (PRILOSEC) 20 MG capsule Take 1 capsule (20 mg total) by mouth daily.  . Ranibizumab (LUCENTIS IO) Inject into the eye as directed. Eye injections once monthly- unsure of dose  . simvastatin (ZOCOR) 40 MG tablet Take 1 tablet (40 mg total) by mouth at bedtime.     Allergies: Allergies  Allergen  Reactions  . Other Hives and Diarrhea    Peppers   . Tomato Diarrhea  . Valium Other (See Comments)    Affects her behavioral    Social History: The patient  reports that she quit smoking about 6 years ago. Her smoking use included cigarettes. She smoked 1.00 pack per day. She has never used smokeless tobacco. She reports that she does not drink alcohol or use drugs.   Family History: The patient's family history includes Alcohol abuse in her father and mother; Breast cancer (age of onset: 23) in her maternal grandmother; Breast cancer (age of onset: 80) in her cousin; Breast cancer (age of onset: 27) in her maternal aunt; Breast cancer (age of onset: 45) in her mother; Breast cancer (age of onset: 24) in her maternal aunt; Cancer (age of onset: 16) in her cousin; Cancer - Lung in her father; Leukemia (age of onset: 10) in her sister; Lung cancer in her father; Stomach cancer (age of onset: 57) in her paternal aunt.   Review of Systems: Please see the history of present illness.   All other systems are reviewed and negative.   Physical Exam: VS:  BP 120/84   Pulse 68   Ht 5\' 7"  (1.702 m)   Wt 170 lb 12.8 oz (77.5 kg)   LMP 02/18/1995   SpO2 97%   BMI 26.75 kg/m  .  BMI Body mass index is 26.75 kg/m.  Wt Readings from Last 3 Encounters:  10/04/18 170 lb 12.8 oz (77.5 kg)  05/26/18 173 lb (78.5 kg)  04/22/18 177 lb (80.3  kg)    General: Pleasant. Well developed, well nourished and in no acute distress. Weight is down 7 pounds since March.   HEENT: Normal.  Neck: Supple, no JVD, carotid bruits, or masses noted.  Cardiac: Regular rate and rhythm. Soft outflow murmur. No edema.  Respiratory:  Lungs are clear to auscultation bilaterally with normal work of breathing.  GI: Soft and nontender.  MS: No deformity or atrophy. Gait and ROM intact.  Skin: Warm and dry. Color is normal.  Neuro:  Strength and sensation are intact and no gross focal deficits noted.  Psych: Alert, appropriate and with normal affect.   LABORATORY DATA:  EKG:  EKG is not ordered today.   Lab Results  Component Value Date   WBC 8.9 08/02/2018   HGB 14.9 08/02/2018   HCT 44.5 08/02/2018   PLT 271 08/02/2018   GLUCOSE 93 08/02/2018   CHOL 149 08/02/2018   TRIG 251 (H) 08/02/2018   HDL 40 08/02/2018   LDLCALC 59 08/02/2018   ALT 16 08/02/2018   AST 20 08/02/2018   NA 142 08/02/2018   K 3.8 08/02/2018   CL 104 08/02/2018   CREATININE 0.80 08/02/2018   BUN 13 08/02/2018   CO2 23 08/02/2018   TSH 1.190 03/09/2017   INR 0.90 09/02/2013     BNP (last 3 results) No results for input(s): BNP in the last 8760 hours.  ProBNP (last 3 results) No results for input(s): PROBNP in the last 8760 hours.   Other Studies Reviewed Today:  LEFT HEART CATH AND CORONARY ANGIOGRAPHY9/30/2019  Conclusion    Review of prior images demonstrates distal LAD SCAD in December 2002 that healed spontaneously. 10 months later, October 2003, SCAD in distal RCA treated with stenting.  Left main is normal  LAD is large and wraps around the apex. LAD is normal and previous site of SCAD 15 years ago is normal.  Circumflex coronary artery is normal.  RCA is widely patent. Segmental 30% proximal to mid atherosclerotic disease. Mid to distal RCA stents are widely patent. The stents are in the distribution of previous SCAD October 2003.   Inferobasal akinesis. EF 45 to 50%. Normal LVEDP.  RECOMMENDATIONS:   Continue aggressive primary prevention/risk factor modification: Monitor glycemic control, blood pressure less than 130/80 mmHg, LDL less than 70, smoking cessation, and at least 150 minutes of moderate aerobic activity per week  Current symptoms not felt to represent ischemia/obstructive CAD.  Recommend Aspirin 81mg  daily for moderate CAD.   EchoStudy Conclusions8/2018  - Left ventricle: The cavity size was mildly dilated. Systolic function was normal. The estimated ejection fraction was in the range of 55% to 60%. Wall motion was normal; there were no regional wall motion abnormalities. Features are consistent with a pseudonormal left ventricular filling pattern, with concomitant abnormal relaxation and increased filling pressure (grade 2 diastolic dysfunction). Doppler parameters are consistent with high ventricular filling pressure. - Aortic valve: There was mild regurgitation. - Mitral valve: There was trivial regurgitation.   48 HR HolterStudy Highlights6/2018  Sinus rhythm with sinus bradycardia. Lowest heart rate 49 bpm at 4am.  Frequent premature ventricular contractions (760 during monitoring period) with several episodes of bigeminy.  Rare premature atrial contractions    ASSESSMENT & PLAN:    1. CAD with remote MI, history of prior SCAD and vasospasm, remote PCI to the distal RCA - last cath from 10/2017 with reassuring data -she has no active symptoms. She is doing quite well. She is losing weight. Continue with CV risk factor modification.   2. HTN- BP looks good. No changes made today.   3. Palpitations - no longer endorsed.   4. Former smoker - resolved.   5. HLD- on statin.Her labs from June are noted. LDL was 59. She is actively working on her triglycerides. She has lost weight.   6. Mild LV dysfunction - EF is 45 to 50% by cath 10/2017 - she has  no symptoms.   7. Mild valvular heart disease - no symptoms - would follow.   8. COVID-19 Education: The signs and symptoms of COVID-19 were discussed with the patient and how to seek care for testing (follow up with PCP or arrange E-visit).  The importance of social distancing, staying at home, hand hygiene and wearing a mask when out in public were discussed today.  Current medicines are reviewed with the patient today.  The patient does not have concerns regarding medicines other than what has been noted above.  The following changes have been made:  See above.  Labs/ tests ordered today include:   No orders of the defined types were placed in this encounter.    Disposition:   FU with me in 4 months with fasting labs.    Patient is agreeable to this plan and will call if any problems develop in the interim.   SignedTruitt Merle, NP  10/04/2018 11:17 AM  [facility] [address] [place], [place]  [address] Phone: [phone] Fax: [phone]

## 2018-10-04 ENCOUNTER — Other Ambulatory Visit: Payer: Self-pay

## 2018-10-04 ENCOUNTER — Ambulatory Visit (INDEPENDENT_AMBULATORY_CARE_PROVIDER_SITE_OTHER): Payer: Medicare Other | Admitting: Nurse Practitioner

## 2018-10-04 ENCOUNTER — Other Ambulatory Visit: Payer: Medicare Other

## 2018-10-04 ENCOUNTER — Encounter: Payer: Self-pay | Admitting: Nurse Practitioner

## 2018-10-04 VITALS — BP 120/84 | HR 68 | Ht 67.0 in | Wt 170.8 lb

## 2018-10-04 DIAGNOSIS — I10 Essential (primary) hypertension: Secondary | ICD-10-CM

## 2018-10-04 DIAGNOSIS — Z79899 Other long term (current) drug therapy: Secondary | ICD-10-CM | POA: Diagnosis not present

## 2018-10-04 DIAGNOSIS — I259 Chronic ischemic heart disease, unspecified: Secondary | ICD-10-CM | POA: Diagnosis not present

## 2018-10-04 DIAGNOSIS — E785 Hyperlipidemia, unspecified: Secondary | ICD-10-CM

## 2018-10-04 DIAGNOSIS — Z7189 Other specified counseling: Secondary | ICD-10-CM

## 2018-10-04 DIAGNOSIS — I119 Hypertensive heart disease without heart failure: Secondary | ICD-10-CM

## 2018-10-04 NOTE — Patient Instructions (Addendum)
After Visit Summary:  We will be checking the following labs today - NONE   Medication Instructions:    Continue with your current medicines.    If you need a refill on your cardiac medications before your next appointment, please call your pharmacy.     Testing/Procedures To Be Arranged:  N/A  Follow-Up:   See me in about 4 months with fasting labs    At [facility], you and your health needs are our priority.  As part of our continuing mission to provide you with exceptional heart care, we have created designated Provider Care Teams.  These Care Teams include your primary Cardiologist (physician) and Advanced Practice Providers (APPs -  Physician Assistants and Nurse Practitioners) who all work together to provide you with the care you need, when you need it.  Special Instructions:  . Stay safe, stay home, wash your hands for at least 20 seconds and wear a mask when out in public.  . It was good to talk with you today.    Call the [facility] office at [phone] if you have any questions, problems or concerns.

## 2018-10-11 ENCOUNTER — Ambulatory Visit (INDEPENDENT_AMBULATORY_CARE_PROVIDER_SITE_OTHER): Payer: Medicare Other | Admitting: Obstetrics and Gynecology

## 2018-10-11 ENCOUNTER — Other Ambulatory Visit: Payer: Self-pay

## 2018-10-11 ENCOUNTER — Encounter: Payer: Self-pay | Admitting: Obstetrics and Gynecology

## 2018-10-11 VITALS — BP 120/84 | HR 68 | Temp 97.9°F | Wt 167.8 lb

## 2018-10-11 DIAGNOSIS — R3915 Urgency of urination: Secondary | ICD-10-CM

## 2018-10-11 DIAGNOSIS — N3090 Cystitis, unspecified without hematuria: Secondary | ICD-10-CM | POA: Diagnosis not present

## 2018-10-11 DIAGNOSIS — I259 Chronic ischemic heart disease, unspecified: Secondary | ICD-10-CM | POA: Diagnosis not present

## 2018-10-11 DIAGNOSIS — R350 Frequency of micturition: Secondary | ICD-10-CM

## 2018-10-11 LAB — POCT URINALYSIS DIPSTICK
Bilirubin, UA: NEGATIVE
Blood, UA: POSITIVE
Glucose, UA: NEGATIVE
Ketones, UA: NEGATIVE
Nitrite, UA: NEGATIVE
Protein, UA: NEGATIVE
Spec Grav, UA: 1.01 (ref 1.010–1.025)
Urobilinogen, UA: 0.2 E.U./dL
pH, UA: 5 (ref 5.0–8.0)

## 2018-10-11 MED ORDER — SULFAMETHOXAZOLE-TRIMETHOPRIM 800-160 MG PO TABS: 1.0000 | ORAL_TABLET | Freq: Two times a day (BID) | ORAL | 0 refills | Status: DC

## 2018-10-11 MED ORDER — PHENAZOPYRIDINE HCL 200 MG PO TABS: 200.0000 mg | ORAL_TABLET | Freq: Three times a day (TID) | ORAL | 0 refills | Status: DC | PRN

## 2018-10-11 NOTE — Patient Instructions (Signed)

## 2018-10-11 NOTE — Progress Notes (Signed)
GYNECOLOGY  VISIT   HPI: 63 y.o.   Married White or Caucasian Not Hispanic or Latino  female   [id number] with Patient's last menstrual period was 02/18/1995.   here for UTI symptoms that began Saturday morning. Patient has been taking Doxycyline 100 mg daily since Saturday.  Dysuria started 2 days ago, associated with frequency and urgency. Worsening urge incontinence in the last 2 days.  She had some doxycycline tablets left at home. She feels a little better with the antibiotic. She was having gross hematuria as well, that has stopped.   No fever. Some back pain, but was working in the yard.   No vulvar c/o.     GYNECOLOGIC HISTORY: Patient's last menstrual period was 02/18/1995. Contraception: Postmenopausal Menopausal hormone therapy: None        OB History    Gravida  2   Para      Term      Preterm      AB  2   Living  0     SAB  2   TAB      Ectopic      Multiple      Live Births                 Patient Active Problem List   Diagnosis Date Noted  . Genetic testing 06/19/2017  . Family history of breast cancer   . Family history of leukemia   . Family history of stomach cancer   . Rectal bleeding 09/02/2013  . CAD (coronary artery disease) 07/04/2010  . Shoulder pain   . Bruises easily   . Joint pain   . Ischemic heart disease   . Hyperlipidemia     Past Medical History:  Diagnosis Date  . Arthritis   . Benign tumor of adrenal gland   . CAD (coronary artery disease)    Inferior MI 1996 tx with bare metal stent RCA. Repeat  anterior MI 2002 with LAD vasospasm. Repeat inferior MI 2003 with occlusion RCA with overlapping Cypher DES in RCA. Repeat cath 2003 with profound vasospasm LAd and Circumflex. Repeat cath 2007  with patent RCA and no disease in LAD or Circumflex.  . Depression   . Environmental allergies   . Family history of adverse reaction to anesthesia    mother had ? problem with anesthesia - was alcoholic - "mind was never right  again"  . Family history of breast cancer   . Family history of leukemia   . Family history of stomach cancer   . GERD (gastroesophageal reflux disease)   . Headache    sinus headaches  . Heart murmur   . History of stomach ulcers    due to taking aspirin for headaches  . HTN (hypertension)   . Hyperlipidemia   . Myocardial infarction ([facility])    times 4 "very minor" stents x2   . Osteopenia   . Osteoporosis   . Rotator cuff tear    left  . Sleep apnea    uses c pap  . Urinary incontinence     Past Surgical History:  Procedure Laterality Date  . ABDOMINAL HYSTERECTOMY    . BACK SURGERY    . LAPAROSCOPIC LYSIS OF ADHESIONS    . LEFT HEART CATH AND CORONARY ANGIOGRAPHY N/A 11/16/2017   Procedure: LEFT HEART CATH AND CORONARY ANGIOGRAPHY;  Surgeon: Belva Crome, MD;  Location: [facility];  Service: Cardiovascular;  Laterality: N/A;  . MYOMECTOMY  age 17  . PELVIC ABCESS DRAINAGE    . SHOULDER OPEN ROTATOR CUFF REPAIR Left 11/16/2014   Procedure: LEFT MINI OPEN ROTATOR CUFF REPAIR SHOULDER OPEN WITH SUBACROMIAL DECOMPRESSION;  Surgeon: Susa Day, MD;  Location: [facility];  Service: Orthopedics;  Laterality: Left;    Current Outpatient Medications  Medication Sig Dispense Refill  . Acetaminophen-Caffeine (EXCEDRIN ASPIRIN FREE PO) Take 2 tablets by mouth daily.     . Artificial Tear Solution (SOOTHE XP) SOLN Place 1 drop into both eyes daily as needed (dry eyes).    . Calcium Citrate-Vitamin D (CITRACAL + D PO) Take 1 tablet by mouth 2 (two) times daily.    . clobetasol ointment (TEMOVATE) [id number] % Apply 1 application topically 2 (two) times daily. Apply as directed twice daily 30 g 0  . clopidogrel (PLAVIX) 75 MG tablet Take 1 tablet (75 mg total) by mouth daily. 90 tablet 3  . diltiazem (CARDIZEM CD) 120 MG 24 hr capsule TAKE 1 CAPSULE BY MOUTH EVERY DAY 90 capsule 3  . diphenhydrAMINE (BENADRYL) 25 MG tablet Take 25-50 mg by mouth daily as needed for allergies.    Marland Kitchen  escitalopram (LEXAPRO) 10 MG tablet Take 1 tablet (10 mg total) by mouth daily. 90 tablet 3  . isosorbide mononitrate (IMDUR) 30 MG 24 hr tablet Take 0.5 tablets (15 mg total) by mouth at bedtime. 45 tablet 3  . lisinopril (ZESTRIL) 20 MG tablet Take 1 tablet (20 mg total) by mouth daily. 90 tablet 3  . metoprolol succinate (TOPROL-XL) 25 MG 24 hr tablet Take 1 tablet (25 mg total) by mouth daily. 90 tablet 3  . montelukast (SINGULAIR) 10 MG tablet Take 10 mg by mouth at bedtime.    . Multiple Vitamins-Minerals (CENTRUM SILVER PO) Take 1 tablet by mouth at bedtime.    . Multiple Vitamins-Minerals (PRESERVISION AREDS 2) CAPS Take 1 capsule by mouth 2 (two) times daily.    Marland Kitchen omeprazole (PRILOSEC) 20 MG capsule Take 1 capsule (20 mg total) by mouth daily. 90 capsule 3  . Ranibizumab (LUCENTIS IO) Inject into the eye as directed. Eye injections once monthly- unsure of dose    . simvastatin (ZOCOR) 40 MG tablet Take 1 tablet (40 mg total) by mouth at bedtime. 90 tablet 3   No current facility-administered medications for this visit.      ALLERGIES: Other, Tomato, and Valium  Family History  Problem Relation Age of Onset  . Alcohol abuse Father   . Lung cancer Father   . Cancer - Lung Father   . Alcohol abuse Mother   . Breast cancer Mother 33  . Breast cancer Maternal Aunt 65  . Breast cancer Maternal Grandmother 34  . Breast cancer Maternal Aunt 72  . Breast cancer Cousin 30  . Leukemia Sister 22  . Stomach cancer Paternal Aunt 45  . Cancer Cousin 73       type unk    Social History   Socioeconomic History  . Marital status: Married    Spouse name: Not on file  . Number of children: 0  . Years of education: Not on file  . Highest education level: Not on file  Occupational History    Employer: [employer]  Social Needs  . Financial resource strain: Not on file  . Food insecurity    Worry: Not on file    Inability: Not on file  . Transportation needs    Medical: Not on  file    Non-medical: Not  on file  Tobacco Use  . Smoking status: Former Smoker    Packs/day: 1.00    Types: Cigarettes    Quit date: 11/14/2011    Years since quitting: 6.9  . Smokeless tobacco: Never Used  . Tobacco comment: currently using e-cigs  Substance and Sexual Activity  . Alcohol use: No  . Drug use: No  . Sexual activity: Not Currently    Partners: Male    Birth control/protection: Surgical    Comment: TAH  Lifestyle  . Physical activity    Days per week: Not on file    Minutes per session: Not on file  . Stress: Not on file  Relationships  . Social Herbalist on phone: Not on file    Gets together: Not on file    Attends religious service: Not on file    Active member of club or organization: Not on file    Attends meetings of clubs or organizations: Not on file    Relationship status: Not on file  . Intimate partner violence    Fear of current or ex partner: Not on file    Emotionally abused: Not on file    Physically abused: Not on file    Forced sexual activity: Not on file  Other Topics Concern  . Not on file  Social History Narrative  . Not on file    Review of Systems  Constitutional: Negative.   HENT: Negative.   Eyes: Negative.   Respiratory: Negative.   Cardiovascular: Negative.   Gastrointestinal: Negative.   Genitourinary: Positive for dysuria, frequency, hematuria and urgency.  Musculoskeletal: Negative.   Skin: Negative.   Neurological: Negative.   Endo/Heme/Allergies: Negative.   Psychiatric/Behavioral: Negative.     PHYSICAL EXAMINATION:    BP 120/84 (BP Location: Right Arm, Patient Position: Sitting, Cuff Size: Normal)   Pulse 68   Temp 97.9 F (36.6 C) (Skin)   Wt 167 lb 12.8 oz (76.1 kg)   LMP 02/18/1995   BMI 26.28 kg/m     General appearance: alert, cooperative and appears stated age Abdomen: soft, non-tender; non distended, no masses,  no organomegaly CVA: not tender  Urine dip: + trace blood, 1+  leuk  ASSESSMENT UTI, may be partial treated with doxycycline    PLAN Treat with Bactrim and pyridium Send urine for ua, c&s   An After Visit Summary was printed and given to the patient.

## 2018-10-12 LAB — URINALYSIS, MICROSCOPIC ONLY
Bacteria, UA: NONE SEEN
Casts: NONE SEEN /lpf

## 2018-10-12 LAB — URINE CULTURE

## 2018-10-13 ENCOUNTER — Other Ambulatory Visit: Payer: Self-pay | Admitting: Obstetrics and Gynecology

## 2018-10-13 DIAGNOSIS — R3129 Other microscopic hematuria: Secondary | ICD-10-CM

## 2018-10-13 NOTE — Progress Notes (Signed)
Patient will return for repeat ccua for urine dip, if + for blood then send for micro ua.

## 2018-12-08 ENCOUNTER — Telehealth: Payer: Self-pay | Admitting: *Deleted

## 2018-12-08 NOTE — Telephone Encounter (Signed)
   [facility] Pre-operative Risk Assessment    Request for surgical clearance:  1. What type of surgery is being performed? EGD & COLONOSCOPY   2. When is this surgery scheduled? 12/17/18   3. What type of clearance is required (medical clearance vs. Pharmacy clearance to hold med vs. Both)? MEDICAL  4. Are there any medications that need to be held prior to surgery and how long? PLAVIX   5. Practice name and name of physician performing surgery? [facility] DR. MANN   6. What is your office phone number [phone]    7.   What is your office fax number [phone]  8.   Anesthesia type (None, local, MAC, general) ? PROPOFOL   Donna Bullock 12/08/2018, 5:15 PM  _________________________________________________________________   (provider comments below)

## 2018-12-08 NOTE — Telephone Encounter (Signed)
Dr. [name]  Can you please comment on this patients Plavix therapy? She is for an EGD as well as a colonoscopy on 12/17/2018. GI team is asking for recommendations on holding antiplatelet prior to procedure. She was recently seen by Cecille Rubin on 10/04/2018 and was doing well. She has a long cardiac hx. Hx of distal LAD SCAD from 01/2001, 11/2001 SCAD in distal RCA treated with stenting. Last cath 11/16/2017 with stable LAD, normal LCx, patent PCA stents and EF of 45-50%.   Please send your recommendations to pre-op pool   Thank you  Sharee Pimple

## 2018-12-09 NOTE — Telephone Encounter (Signed)
OK to hold Plavix prior to her procedure.   Donna Bullock

## 2018-12-09 NOTE — Telephone Encounter (Signed)
   Primary Cardiologist: Lauree Chandler, MD  Chart reviewed as part of pre-operative protocol coverage. Given past medical history and time since last visit, based on ACC/AHA guidelines, Donna Bullock would be at acceptable risk for the planned procedure without further cardiovascular testing.   Per Dr. [name], the patient may hold Plavix up to 5 days prior to procedure then resume soon thereafter when acceptable per procedural team.   I will route this recommendation to the requesting party via [facility] fax function and remove from pre-op pool.  Please call with questions.  Kathyrn Drown, NP 12/09/2018, 8:45 AM

## 2018-12-10 ENCOUNTER — Telehealth: Payer: Self-pay | Admitting: Obstetrics and Gynecology

## 2018-12-10 NOTE — Telephone Encounter (Signed)
Call placed to follow up with patient in regards to a physical therapy referral.

## 2018-12-21 NOTE — Telephone Encounter (Signed)
Per [place] patient has not responded for scheduling. Per Dr. Talbert Nan ok to close the referral.

## 2019-01-20 NOTE — Progress Notes (Signed)
CARDIOLOGY OFFICE NOTE  Date:  01/25/2019    Donna Bullock Date of Birth: 02-06-1956 Medical Record [id number]  PCP:  Jani Gravel, MD  Cardiologist:  Aldona Bar   Chief Complaint  Patient presents with   Follow-up    History of Present Illness: Donna Bullock is a 63 y.o. female who presents today for a 4 month check.  Seen for Dr. [name]. She is a former patient of Dr. Susa Simmonds that I previously cared for.She primarily follows with me.  She has a history of CAD, HLD, HTN, and tobacco abuse. Her CAD dates back to 1996 when she had a stent placed in her RCA. She had an anterior MI with cardiac arrest (VFib) in 2002. Cath showed a 75% distal LAD narrowing and this was managed conservatively with relook cath two days later showing 50% distal stenosis. She then had an inferior MI in 2003 with placement of overlapping Cypher drug eluting stents in the RCA. The LAD and Circumflex had mild disease per report. Four days later, she had severe chest pain, cath showed severe spasm of the LAD and Circumflex which resolved with IC vasodilators. Her last cath was in 2007 and showed patency of stents in the RCA and no other obstructive disease in the LAD and Circumflex.Remotestress test was in November 2009 and showed normal LVEF with no evidence of ischemia. She was seen in the ED 09/02/13 with bright red blood per rectum. Her ASA and Plavix were held. Colonoscopy 09/09/13 per Dr. Collene Mares with polyps removed but no other bleeding source identified. Aspirin wasstopped but shehas continued to useExcedrin Migraine.   Her other issues include RA, HLD, HTN and past tobacco abuse along with depression/anxiety.  I had not seen her in 5 1/2 years until August of 2016. She had since gotten married -toMark Norwood who I also see.She had stopped smoking. Some vague symptoms noted - did not want to have stress testing but then needed shoulder surgery and I got her Myoview updated -no  ischemia but EF a little down - better by echo from 2018 - she has been managed medically since.She has seen Dr. Lovena Le for palpitations. She ended up having repeat cath back in 10/2017 (last study had been 12 years prior) due to chest pain - this was reassuring. Her prior anginal equivalent has never been typical - she presented once with a VF arrest. Her vasospasm was more of a choking sensation and she has SCAD twice.   We did a telehealth visit back in April. I saw her in August in the office - she was doing well. Working on weight loss.   The patient does not have symptoms concerning for COVID-19 infection (fever, chills, cough, or new shortness of breath).   Comes in today. Here with her husband [name] - I am seeing him as well. She is more concerned about Mark. She is doing fine. No chest pain. Breathing is good. Not dizzy. Needs labs. She has no concerns.   Past Medical History:  Diagnosis Date   Arthritis    Benign tumor of adrenal gland    CAD (coronary artery disease)    Inferior MI 1996 tx with bare metal stent RCA. Repeat  anterior MI 2002 with LAD vasospasm. Repeat inferior MI 2003 with occlusion RCA with overlapping Cypher DES in RCA. Repeat cath 2003 with profound vasospasm LAd and Circumflex. Repeat cath 2007  with patent RCA and no disease in LAD or Circumflex.   Depression  Environmental allergies    Family history of adverse reaction to anesthesia    mother had ? problem with anesthesia - was alcoholic - "mind was never right again"   Family history of breast cancer    Family history of leukemia    Family history of stomach cancer    GERD (gastroesophageal reflux disease)    Headache    sinus headaches   Heart murmur    History of stomach ulcers    due to taking aspirin for headaches   HTN (hypertension)    Hyperlipidemia    Myocardial infarction ([facility])    times 4 "very minor" stents x2    Osteopenia    Osteoporosis    Rotator cuff tear    left    Sleep apnea    uses c pap   Urinary incontinence     Past Surgical History:  Procedure Laterality Date   ABDOMINAL HYSTERECTOMY     BACK SURGERY     LAPAROSCOPIC LYSIS OF ADHESIONS     LEFT HEART CATH AND CORONARY ANGIOGRAPHY N/A 11/16/2017   Procedure: LEFT HEART CATH AND CORONARY ANGIOGRAPHY;  Surgeon: Belva Crome, MD;  Location: [facility];  Service: Cardiovascular;  Laterality: N/A;   MYOMECTOMY     age 56   PELVIC ABCESS DRAINAGE     SHOULDER OPEN ROTATOR CUFF REPAIR Left 11/16/2014   Procedure: LEFT MINI OPEN ROTATOR CUFF REPAIR SHOULDER OPEN WITH SUBACROMIAL DECOMPRESSION;  Surgeon: Susa Day, MD;  Location: [facility];  Service: Orthopedics;  Laterality: Left;     Medications: Current Meds  Medication Sig   Acetaminophen-Caffeine (EXCEDRIN ASPIRIN FREE PO) Take 2 tablets by mouth daily.    Artificial Tear Solution (SOOTHE XP) SOLN Place 1 drop into both eyes daily as needed (dry eyes).   Calcium Citrate-Vitamin D (CITRACAL + D PO) Take 1 tablet by mouth 2 (two) times daily.   clobetasol ointment (TEMOVATE) [id number] % Apply 1 application topically 2 (two) times daily. Apply as directed twice daily   clopidogrel (PLAVIX) 75 MG tablet Take 1 tablet (75 mg total) by mouth daily.   diltiazem (CARDIZEM CD) 120 MG 24 hr capsule TAKE 1 CAPSULE BY MOUTH EVERY DAY   diphenhydrAMINE (BENADRYL) 25 MG tablet Take 25-50 mg by mouth daily as needed for allergies.   escitalopram (LEXAPRO) 10 MG tablet Take 1 tablet (10 mg total) by mouth daily.   isosorbide mononitrate (IMDUR) 30 MG 24 hr tablet Take 0.5 tablets (15 mg total) by mouth at bedtime.   lisinopril (ZESTRIL) 20 MG tablet Take 1 tablet (20 mg total) by mouth daily.   metoprolol succinate (TOPROL-XL) 25 MG 24 hr tablet Take 1 tablet (25 mg total) by mouth daily.   montelukast (SINGULAIR) 10 MG tablet Take 10 mg by mouth at bedtime.   Multiple Vitamins-Minerals (CENTRUM SILVER PO) Take 1 tablet by  mouth at bedtime.   Multiple Vitamins-Minerals (PRESERVISION AREDS 2) CAPS Take 1 capsule by mouth 2 (two) times daily.   omeprazole (PRILOSEC) 20 MG capsule Take 1 capsule (20 mg total) by mouth daily.   phenazopyridine (PYRIDIUM) 200 MG tablet Take 1 tablet (200 mg total) by mouth 3 (three) times daily as needed for pain.   Ranibizumab (LUCENTIS IO) Inject into the eye as directed. Eye injections once monthly- unsure of dose   simvastatin (ZOCOR) 40 MG tablet Take 1 tablet (40 mg total) by mouth at bedtime.   sulfamethoxazole-trimethoprim (BACTRIM DS) 800-160 MG tablet Take 1 tablet by  mouth 2 (two) times daily. One PO BID x 3 days     Allergies: Allergies  Allergen Reactions   Other Hives and Diarrhea    Peppers    Tomato Diarrhea   Valium Other (See Comments)    Affects her behavioral    Social History: The patient  reports that she quit smoking about 7 years ago. Her smoking use included cigarettes. She smoked 1.00 pack per day. She has never used smokeless tobacco. She reports that she does not drink alcohol or use drugs.   Family History: The patient's family history includes Alcohol abuse in her father and mother; Breast cancer (age of onset: 78) in her maternal grandmother; Breast cancer (age of onset: 68) in her cousin; Breast cancer (age of onset: 101) in her maternal aunt; Breast cancer (age of onset: 55) in her mother; Breast cancer (age of onset: 98) in her maternal aunt; Cancer (age of onset: 35) in her cousin; Cancer - Lung in her father; Leukemia (age of onset: 45) in her sister; Lung cancer in her father; Stomach cancer (age of onset: 79) in her paternal aunt.   Review of Systems: Please see the history of present illness.   All other systems are reviewed and negative.   Physical Exam: VS:  BP 120/86    Pulse (!) 56    Ht 5\' 7"  (1.702 m)    Wt 169 lb 12.8 oz (77 kg)    LMP 02/18/1995    SpO2 93%    BMI 26.59 kg/m  .  BMI Body mass index is 26.59 kg/m.  Wt  Readings from Last 3 Encounters:  01/25/19 169 lb 12.8 oz (77 kg)  10/11/18 167 lb 12.8 oz (76.1 kg)  10/04/18 170 lb 12.8 oz (77.5 kg)    General: Pleasant. Well developed, well nourished and in no acute distress.   HEENT: Normal.  Neck: Supple, no JVD, carotid bruits, or masses noted.  Cardiac: Regular rate and rhythm. No murmurs, rubs, or gallops. No edema.  Respiratory:  Lungs are clear to auscultation bilaterally with normal work of breathing.  GI: Soft and nontender.  MS: No deformity or atrophy. Gait and ROM intact.  Skin: Warm and dry. Color is normal.  Neuro:  Strength and sensation are intact and no gross focal deficits noted.  Psych: Alert, appropriate and with normal affect.   LABORATORY DATA:  EKG:  EKG is ordered today. This demonstrates sinus bradycardia - HR is 56. Non specific changes.  Lab Results  Component Value Date   WBC 8.9 08/02/2018   HGB 14.9 08/02/2018   HCT 44.5 08/02/2018   PLT 271 08/02/2018   GLUCOSE 93 08/02/2018   CHOL 149 08/02/2018   TRIG 251 (H) 08/02/2018   HDL 40 08/02/2018   LDLCALC 59 08/02/2018   ALT 16 08/02/2018   AST 20 08/02/2018   NA 142 08/02/2018   K 3.8 08/02/2018   CL 104 08/02/2018   CREATININE 0.80 08/02/2018   BUN 13 08/02/2018   CO2 23 08/02/2018   TSH 1.190 03/09/2017   INR 0.90 09/02/2013     BNP (last 3 results) No results for input(s): BNP in the last 8760 hours.  ProBNP (last 3 results) No results for input(s): PROBNP in the last 8760 hours.   Other Studies Reviewed Today:  LEFT HEART CATH AND CORONARY ANGIOGRAPHY9/30/2019  Conclusion    Review of prior images demonstrates distal LAD SCAD in December 2002 that healed spontaneously. 10 months later, October 2003, SCAD  in distal RCA treated with stenting.  Left main is normal  LAD is large and wraps around the apex. LAD is normal and previous site of SCAD 15 years ago is normal.  Circumflex coronary artery is normal.  RCA is widely patent.  Segmental 30% proximal to mid atherosclerotic disease. Mid to distal RCA stents are widely patent. The stents are in the distribution of previous SCAD October 2003.  Inferobasal akinesis. EF 45 to 50%. Normal LVEDP.  RECOMMENDATIONS:   Continue aggressive primary prevention/risk factor modification: Monitor glycemic control, blood pressure less than 130/80 mmHg, LDL less than 70, smoking cessation, and at least 150 minutes of moderate aerobic activity per week  Current symptoms not felt to represent ischemia/obstructive CAD.  Recommend Aspirin 81mg  daily for moderate CAD.   EchoStudy Conclusions8/2018  - Left ventricle: The cavity size was mildly dilated. Systolic function was normal. The estimated ejection fraction was in the range of 55% to 60%. Wall motion was normal; there were no regional wall motion abnormalities. Features are consistent with a pseudonormal left ventricular filling pattern, with concomitant abnormal relaxation and increased filling pressure (grade 2 diastolic dysfunction). Doppler parameters are consistent with high ventricular filling pressure. - Aortic valve: There was mild regurgitation. - Mitral valve: There was trivial regurgitation.   48 HR HolterStudy Highlights6/2018  Sinus rhythm with sinus bradycardia. Lowest heart rate 49 bpm at 4am.  Frequent premature ventricular contractions (760 during monitoring period) with several episodes of bigeminy.  Rare premature atrial contractions    ASSESSMENT & PLAN:   1. CAD with remote MI, history of prior SCAD and vasospasm, remote PCI to the distal RCA -last cath from 9/2019with reassuring data - she is doing ok. She is more worried now about [name] and his situation. Continue with CV risk factor modification. She is trying to work on her weight.   2. HTN - BP is fine - no changes made today.  3. Palpitations - not endorsed.   4. HLD - on statin - fasting today - needs  labs  5. Former smoker - resolved.   6. Mild LV dysfunction - EF 45 to 50% by cath from 2019 - no symptoms - on ACE and beta blocker. BP is good. No changes made today.   7. Mild valvular heart disease - no symptoms - would follow.   8. COVID-19 Education: The signs and symptoms of COVID-19 were discussed with the patient and how to seek care for testing (follow up with PCP or arrange E-visit).  The importance of social distancing, staying at home, hand hygiene and wearing a mask when out in public were discussed today.  Current medicines are reviewed with the patient today.  The patient does not have concerns regarding medicines other than what has been noted above.  The following changes have been made:  See above.  Labs/ tests ordered today include:    Orders Placed This Encounter  Procedures   Basic metabolic panel   CBC no Diff   Hepatic function panel   Lipid Profile     Disposition:   FU with me in 6 months. Lab today.    Patient is agreeable to this plan and will call if any problems develop in the interim.   SignedTruitt Merle, NP  01/25/2019 12:22 PM  [facility] [address] [place], [place]  [address] Phone: [phone] Fax: [phone]

## 2019-01-24 ENCOUNTER — Ambulatory Visit: Payer: Medicare Other | Admitting: Nurse Practitioner

## 2019-01-25 ENCOUNTER — Ambulatory Visit (INDEPENDENT_AMBULATORY_CARE_PROVIDER_SITE_OTHER): Payer: Medicare Other | Admitting: Nurse Practitioner

## 2019-01-25 ENCOUNTER — Encounter: Payer: Self-pay | Admitting: Nurse Practitioner

## 2019-01-25 ENCOUNTER — Other Ambulatory Visit: Payer: Self-pay

## 2019-01-25 VITALS — BP 120/86 | HR 56 | Ht 67.0 in | Wt 169.8 lb

## 2019-01-25 DIAGNOSIS — E785 Hyperlipidemia, unspecified: Secondary | ICD-10-CM | POA: Diagnosis not present

## 2019-01-25 DIAGNOSIS — Z7189 Other specified counseling: Secondary | ICD-10-CM | POA: Diagnosis not present

## 2019-01-25 DIAGNOSIS — R002 Palpitations: Secondary | ICD-10-CM

## 2019-01-25 DIAGNOSIS — I259 Chronic ischemic heart disease, unspecified: Secondary | ICD-10-CM

## 2019-01-25 DIAGNOSIS — I10 Essential (primary) hypertension: Secondary | ICD-10-CM

## 2019-01-25 DIAGNOSIS — Z79899 Other long term (current) drug therapy: Secondary | ICD-10-CM

## 2019-01-25 NOTE — Patient Instructions (Addendum)
After Visit Summary:  We will be checking the following labs today - BMET, CBC, lipids, and LFTs   Medication Instructions:    Continue with your current medicines.    If you need a refill on your cardiac medications before your next appointment, please call your pharmacy.     Testing/Procedures To Be Arranged:  N/A  Follow-Up:   See me in 6 months    At [facility], you and your health needs are our priority.  As part of our continuing mission to provide you with exceptional heart care, we have created designated Provider Care Teams.  These Care Teams include your primary Cardiologist (physician) and Advanced Practice Providers (APPs -  Physician Assistants and Nurse Practitioners) who all work together to provide you with the care you need, when you need it.  Special Instructions:  . Stay safe, stay home, wash your hands for at least 20 seconds and wear a mask when out in public.  . It was good to talk with you today.    Call the [place] office at [phone] if you have any questions, problems or concerns.

## 2019-01-26 LAB — HEPATIC FUNCTION PANEL
ALT: 14 IU/L (ref 0–32)
AST: 18 IU/L (ref 0–40)
Albumin: 4.3 g/dL (ref 3.8–4.8)
Alkaline Phosphatase: 103 IU/L (ref 39–117)
Bilirubin Total: 0.3 mg/dL (ref 0.0–1.2)
Bilirubin, Direct: 0.12 mg/dL (ref 0.00–0.40)
Total Protein: 6.7 g/dL (ref 6.0–8.5)

## 2019-01-26 LAB — LIPID PANEL
Chol/HDL Ratio: 2.9 ratio (ref 0.0–4.4)
Cholesterol, Total: 130 mg/dL (ref 100–199)
HDL: 45 mg/dL (ref >39)
LDL Chol Calc (NIH): 54 mg/dL (ref 0–99)
Triglycerides: 186 mg/dL — ABNORMAL HIGH (ref 0–149)
VLDL Cholesterol Cal: 31 mg/dL (ref 5–40)

## 2019-01-26 LAB — BASIC METABOLIC PANEL
BUN/Creatinine Ratio: 18 (ref 12–28)
BUN: 12 mg/dL (ref 8–27)
CO2: 25 mmol/L (ref 20–29)
Calcium: 9.6 mg/dL (ref 8.7–10.3)
Chloride: 108 mmol/L — ABNORMAL HIGH (ref 96–106)
Creatinine, Ser: 0.65 mg/dL (ref 0.57–1.00)
GFR calc Af Amer: 108 mL/min/{1.73_m2} (ref >59)
GFR calc non Af Amer: 93 mL/min/{1.73_m2} (ref >59)
Glucose: 92 mg/dL (ref 65–99)
Potassium: 4.1 mmol/L (ref 3.5–5.2)
Sodium: 145 mmol/L — ABNORMAL HIGH (ref 134–144)

## 2019-01-26 LAB — CBC
Hematocrit: 44 % (ref 34.0–46.6)
Hemoglobin: 14.9 g/dL (ref 11.1–15.9)
MCH: 30.3 pg (ref 26.6–33.0)
MCHC: 33.9 g/dL (ref 31.5–35.7)
MCV: 89 fL (ref 79–97)
Platelets: 284 10*3/uL (ref 150–450)
RBC: 4.92 x10E6/uL (ref 3.77–5.28)
RDW: 13.5 % (ref 11.7–15.4)
WBC: 8.9 10*3/uL (ref 3.4–10.8)

## 2019-01-31 NOTE — Addendum Note (Signed)
Addended by: Gwyndolyn Saxon R on: 01/31/2019 11:57 AM   Modules accepted: Orders

## 2019-02-23 ENCOUNTER — Telehealth: Payer: Self-pay | Admitting: Obstetrics and Gynecology

## 2019-02-23 DIAGNOSIS — R350 Frequency of micturition: Secondary | ICD-10-CM

## 2019-02-23 DIAGNOSIS — R3129 Other microscopic hematuria: Secondary | ICD-10-CM

## 2019-02-23 DIAGNOSIS — R3915 Urgency of urination: Secondary | ICD-10-CM

## 2019-02-23 MED ORDER — SULFAMETHOXAZOLE-TRIMETHOPRIM 800-160 MG PO TABS: 1.0000 | ORAL_TABLET | Freq: Two times a day (BID) | ORAL | 0 refills | Status: DC

## 2019-02-23 MED ORDER — PHENAZOPYRIDINE HCL 200 MG PO TABS: 200.0000 mg | ORAL_TABLET | Freq: Three times a day (TID) | ORAL | 0 refills | Status: DC | PRN

## 2019-02-23 NOTE — Telephone Encounter (Signed)
Spoke back with pt. Pt aware of Rx sent and how to take them. Pt aware to call back if not better in 48 hours. Pt verbalized understanding. Pt thankful for Rx and thank you for your thoughts and prayers for husband's surgery.   Routing to provider for final review. Patient is agreeable to disposition. Will close encounter.

## 2019-02-23 NOTE — Telephone Encounter (Signed)
Spoke to pt. Pt states having UTI sx since yesterday with blood in urine, pain and discomfort, with urgency and frequency too. Pt states drinking lots of water and straight cranberry juice of 2 oz a day to try to flush through. Pt has hx of UTIs, had last one in 09/2018. Would like to know if could have Rx sent to pharmacy or have a video visit since in quarantine for husband's open heart surgery on Monday 02/28/2019.  Will review with Dr Talbert Nan and will return call to pt. Pt agreeable.  Will route to Dr Talbert Nan for review and recommendations. Have Bactrim and Pyridium Rx pended if approved.

## 2019-02-23 NOTE — Telephone Encounter (Signed)
Patient is calling regarding UTI. Patient stated that she has blood in her urine and is experiencing discomfort. Patient is currently in quarantine and stated that she is willing to do a MyChart Video visit, if needed.

## 2019-02-23 NOTE — Telephone Encounter (Signed)
Scripts sent. If she isn't feeling better in 48 hours then she needs to be seen. Please let her know that I'll be thinking of her and wish her the best with her husbands surgery.

## 2019-03-09 ENCOUNTER — Encounter: Payer: Self-pay | Admitting: *Deleted

## 2019-03-29 ENCOUNTER — Ambulatory Visit: Payer: Medicare Other

## 2019-03-31 ENCOUNTER — Ambulatory Visit: Payer: Medicare Other

## 2019-05-12 ENCOUNTER — Other Ambulatory Visit: Payer: Self-pay | Admitting: Obstetrics and Gynecology

## 2019-05-12 MED ORDER — VALACYCLOVIR HCL 500 MG PO TABS: ORAL_TABLET | ORAL | 1 refills | Status: DC

## 2019-05-12 NOTE — Telephone Encounter (Signed)
Patient is requesting a new prescription for Valtrex to be sent to her new pharmacy. [facility] in [place] [phone]

## 2019-05-12 NOTE — Telephone Encounter (Signed)
Medication refill request: Valtrex  Last AEX:  05/06/17 Next AEX: northing scheduled  Last MMG (if hormonal medication request): NA Refill authorized: #[id number] with 0 RF pended

## 2019-06-13 ENCOUNTER — Other Ambulatory Visit: Payer: Self-pay | Admitting: Obstetrics and Gynecology

## 2019-06-13 ENCOUNTER — Telehealth: Payer: Self-pay | Admitting: Nurse Practitioner

## 2019-06-13 DIAGNOSIS — Z1231 Encounter for screening mammogram for malignant neoplasm of breast: Secondary | ICD-10-CM

## 2019-06-13 NOTE — Telephone Encounter (Signed)
Lipids just checked in Dec. 2020.  So, I do not think she will need to check them again. Will leave for Cecille Rubin to review upon her return. Richardson Dopp, PA-C    06/13/2019 2:45 PM

## 2019-06-13 NOTE — Telephone Encounter (Signed)
[name] is calling wanting to know if Donna Bullock will be wanting a lipid panel at her appointment on 08/03/19 so she can know if she should fast or not. Lauriann states if she is unable to answer when calling back to please leave a detailed message.

## 2019-06-14 ENCOUNTER — Ambulatory Visit
Admission: RE | Admit: 2019-06-14 | Discharge: 2019-06-14 | Disposition: A | Discharge status: Home / Self Care | Payer: Medicare Other | Source: Ambulatory Visit | Attending: Obstetrics and Gynecology | Admitting: Obstetrics and Gynecology

## 2019-06-14 DIAGNOSIS — Z7901 Long term (current) use of anticoagulants: Secondary | ICD-10-CM | POA: Insufficient documentation

## 2019-06-14 DIAGNOSIS — Z1231 Encounter for screening mammogram for malignant neoplasm of breast: Secondary | ICD-10-CM

## 2019-06-18 ENCOUNTER — Emergency Department (HOSPITAL_BASED_OUTPATIENT_CLINIC_OR_DEPARTMENT_OTHER)
Admission: EM | Admit: 2019-06-18 | Discharge: 2019-06-18 | Disposition: A | Discharge status: Home / Self Care | Payer: Medicare Other | Attending: Emergency Medicine | Admitting: Emergency Medicine

## 2019-06-18 ENCOUNTER — Emergency Department (HOSPITAL_BASED_OUTPATIENT_CLINIC_OR_DEPARTMENT_OTHER): Payer: Medicare Other

## 2019-06-18 ENCOUNTER — Encounter (HOSPITAL_BASED_OUTPATIENT_CLINIC_OR_DEPARTMENT_OTHER): Payer: Self-pay | Admitting: *Deleted

## 2019-06-18 ENCOUNTER — Other Ambulatory Visit: Payer: Self-pay

## 2019-06-18 DIAGNOSIS — S52502A Unspecified fracture of the lower end of left radius, initial encounter for closed fracture: Secondary | ICD-10-CM

## 2019-06-18 DIAGNOSIS — I10 Essential (primary) hypertension: Secondary | ICD-10-CM | POA: Insufficient documentation

## 2019-06-18 DIAGNOSIS — W01198A Fall on same level from slipping, tripping and stumbling with subsequent striking against other object, initial encounter: Secondary | ICD-10-CM | POA: Insufficient documentation

## 2019-06-18 DIAGNOSIS — F17210 Nicotine dependence, cigarettes, uncomplicated: Secondary | ICD-10-CM | POA: Diagnosis not present

## 2019-06-18 DIAGNOSIS — Z79899 Other long term (current) drug therapy: Secondary | ICD-10-CM | POA: Diagnosis not present

## 2019-06-18 DIAGNOSIS — Y92007 Garden or yard of unspecified non-institutional (private) residence as the place of occurrence of the external cause: Secondary | ICD-10-CM | POA: Diagnosis not present

## 2019-06-18 DIAGNOSIS — Y9302 Activity, running: Secondary | ICD-10-CM | POA: Insufficient documentation

## 2019-06-18 DIAGNOSIS — Y999 Unspecified external cause status: Secondary | ICD-10-CM | POA: Insufficient documentation

## 2019-06-18 DIAGNOSIS — Z7902 Long term (current) use of antithrombotics/antiplatelets: Secondary | ICD-10-CM | POA: Insufficient documentation

## 2019-06-18 DIAGNOSIS — S92415A Nondisplaced fracture of proximal phalanx of left great toe, initial encounter for closed fracture: Secondary | ICD-10-CM | POA: Diagnosis not present

## 2019-06-18 DIAGNOSIS — S59912A Unspecified injury of left forearm, initial encounter: Secondary | ICD-10-CM | POA: Diagnosis present

## 2019-06-18 MED ORDER — HYDROCODONE-ACETAMINOPHEN 5-325 MG PO TABS
1.0000 | ORAL_TABLET | Freq: Once | ORAL | Status: AC
  Administered 2019-06-18: 1 via ORAL
  Filled 2019-06-18: qty 1

## 2019-06-18 MED ORDER — HYDROCODONE-ACETAMINOPHEN 5-325 MG PO TABS: 1.0000 | ORAL_TABLET | ORAL | 0 refills | Status: DC | PRN

## 2019-06-18 NOTE — ED Notes (Signed)
Dr Langston Masker ED Provider at bedside.

## 2019-06-18 NOTE — Discharge Instructions (Addendum)
Keep splint clean and dry. You can remove the sling to sleep or as needed. You may NOT remove the arm splint.  Elevate arm/foot, apply ice for 20 minutes at a time.  Take Norco as needed as prescribed for pain. Take Colace as needed to prevent constipation from the Norco.   Follow up with orthopedics, call Monday to schedule an appointment.

## 2019-06-18 NOTE — ED Notes (Signed)
ED Provider at bedside. 

## 2019-06-18 NOTE — ED Provider Notes (Signed)
[facility] Provider Note   CSN: [id number] Arrival date & time: 06/18/19  1411     History Chief Complaint  Patient presents with  . Fall    Donna Bullock is a 64 y.o. female.  64 year old female presents with injury to left wrist.  Patient states that she was running through her yard to check the gait when she tripped and fell on the concrete resulting in an injury to her left wrist, states that she is unable to straighten out the fingers in her hand without a pulling sensation in the wrist.  Also reports minor abrasion to her left knee which she cleaned and dressed at home as well as pain in her left great toe with bruising.  Patient is on Plavix, denies hitting her head or loss of consciousness.  No other injuries or concerns.  Last tetanus was less than 5 years ago.        Past Medical History:  Diagnosis Date  . Arthritis   . Benign tumor of adrenal gland   . CAD (coronary artery disease)    Inferior MI 1996 tx with bare metal stent RCA. Repeat  anterior MI 2002 with LAD vasospasm. Repeat inferior MI 2003 with occlusion RCA with overlapping Cypher DES in RCA. Repeat cath 2003 with profound vasospasm LAd and Circumflex. Repeat cath 2007  with patent RCA and no disease in LAD or Circumflex.  . Depression   . Environmental allergies   . Family history of adverse reaction to anesthesia    mother had ? problem with anesthesia - was alcoholic - "mind was never right again"  . Family history of breast cancer   . Family history of leukemia   . Family history of stomach cancer   . GERD (gastroesophageal reflux disease)   . Headache    sinus headaches  . Heart murmur   . History of stomach ulcers    due to taking aspirin for headaches  . HTN (hypertension)   . Hyperlipidemia   . Myocardial infarction ([place])    times 4 "very minor" stents x2   . Osteopenia   . Osteoporosis   . Rotator cuff tear    left  . Sleep apnea    uses c pap  .  Urinary incontinence     Patient Active Problem List   Diagnosis Date Noted  . Genetic testing 06/19/2017  . Family history of breast cancer   . Family history of leukemia   . Family history of stomach cancer   . Rectal bleeding 09/02/2013  . CAD (coronary artery disease) 07/04/2010  . Shoulder pain   . Bruises easily   . Joint pain   . Ischemic heart disease   . Hyperlipidemia     Past Surgical History:  Procedure Laterality Date  . ABDOMINAL HYSTERECTOMY    . BACK SURGERY    . LAPAROSCOPIC LYSIS OF ADHESIONS    . LEFT HEART CATH AND CORONARY ANGIOGRAPHY N/A 11/16/2017   Procedure: LEFT HEART CATH AND CORONARY ANGIOGRAPHY;  Surgeon: Belva Crome, MD;  Location: [facility];  Service: Cardiovascular;  Laterality: N/A;  . MYOMECTOMY     age 2  . PELVIC ABCESS DRAINAGE    . SHOULDER OPEN ROTATOR CUFF REPAIR Left 11/16/2014   Procedure: LEFT MINI OPEN ROTATOR CUFF REPAIR SHOULDER OPEN WITH SUBACROMIAL DECOMPRESSION;  Surgeon: Susa Day, MD;  Location: [facility];  Service: Orthopedics;  Laterality: Left;     OB History  Gravida  2   Para      Term      Preterm      AB  2   Living  0     SAB  2   TAB      Ectopic      Multiple      Live Births              Family History  Problem Relation Age of Onset  . Alcohol abuse Father   . Lung cancer Father   . Cancer - Lung Father   . Alcohol abuse Mother   . Breast cancer Mother 77  . Breast cancer Maternal Aunt 65  . Breast cancer Maternal Grandmother 34  . Breast cancer Maternal Aunt 72  . Breast cancer Cousin 80  . Leukemia Sister 31  . Stomach cancer Paternal Aunt 39  . Cancer Cousin 68       type unk    Social History   Tobacco Use  . Smoking status: Current Some Day Smoker    Packs/day: 1.00    Types: Cigarettes    Last attempt to quit: 11/14/2011    Years since quitting: 7.5  . Smokeless tobacco: Never Used  . Tobacco comment: currently using e-cigs  Substance Use Topics  .  Alcohol use: No  . Drug use: No    Home Medications Prior to Admission medications   Medication Sig Start Date End Date Taking? Authorizing Provider  GABAPENTIN PO Take by mouth.   Yes [provider]  PREDNISONE PO Take by mouth.   Yes [provider]  Acetaminophen-Caffeine (EXCEDRIN ASPIRIN FREE PO) Take 2 tablets by mouth daily.     [provider]  Artificial Tear Solution (SOOTHE XP) SOLN Place 1 drop into both eyes daily as needed (dry eyes).    [provider]  Calcium Citrate-Vitamin D (CITRACAL + D PO) Take 1 tablet by mouth 2 (two) times daily.    [provider]  clobetasol ointment (TEMOVATE) [id number] % Apply 1 application topically 2 (two) times daily. Apply as directed twice daily 04/05/18   Salvadore Dom, MD  clopidogrel (PLAVIX) 75 MG tablet Take 1 tablet (75 mg total) by mouth daily. 09/06/18   Burtis Junes, NP  diltiazem (CARDIZEM CD) 120 MG 24 hr capsule TAKE 1 CAPSULE BY MOUTH EVERY DAY 09/06/18   Burtis Junes, NP  diphenhydrAMINE (BENADRYL) 25 MG tablet Take 25-50 mg by mouth daily as needed for allergies.    [provider]  escitalopram (LEXAPRO) 10 MG tablet Take 1 tablet (10 mg total) by mouth daily. 09/06/18   Burtis Junes, NP  HYDROcodone-acetaminophen (NORCO/VICODIN) 5-325 MG tablet Take 1 tablet by mouth every 4 (four) hours as needed. 06/18/19   Tacy Learn, PA-C  isosorbide mononitrate (IMDUR) 30 MG 24 hr tablet Take 0.5 tablets (15 mg total) by mouth at bedtime. 09/06/18   Burtis Junes, NP  lisinopril (ZESTRIL) 20 MG tablet Take 1 tablet (20 mg total) by mouth daily. 09/06/18   Burtis Junes, NP  metoprolol succinate (TOPROL-XL) 25 MG 24 hr tablet Take 1 tablet (25 mg total) by mouth daily. 09/06/18   Burtis Junes, NP  montelukast (SINGULAIR) 10 MG tablet Take 10 mg by mouth at bedtime.    [provider]  Multiple Vitamins-Minerals (CENTRUM SILVER PO) Take 1 tablet by mouth at  bedtime.    [provider]  Multiple Vitamins-Minerals (PRESERVISION AREDS  2) CAPS Take 1 capsule by mouth 2 (two) times daily.    [provider]  omeprazole (PRILOSEC) 20 MG capsule Take 1 capsule (20 mg total) by mouth daily. 07/30/16   Burtis Junes, NP  phenazopyridine (PYRIDIUM) 200 MG tablet Take 1 tablet (200 mg total) by mouth 3 (three) times daily as needed for pain. 02/23/19   Salvadore Dom, MD  Ranibizumab (LUCENTIS IO) Inject into the eye as directed. Eye injections once monthly- unsure of dose    [provider]  simvastatin (ZOCOR) 40 MG tablet Take 1 tablet (40 mg total) by mouth at bedtime. 09/06/18   Burtis Junes, NP  sulfamethoxazole-trimethoprim (BACTRIM DS) 800-160 MG tablet Take 1 tablet by mouth 2 (two) times daily. One PO BID x 3 days 02/23/19   Salvadore Dom, MD  valACYclovir (VALTREX) 500 MG tablet TAKE 1 TABLET BY MOUTH TWICE A DAY FOR 3 DAYS AS NEEDED 05/12/19   Salvadore Dom, MD    Allergies    Other, Tomato, and Valium  Review of Systems   Review of Systems  Constitutional: Negative for fever.  Eyes: Negative for visual disturbance.  Musculoskeletal: Positive for arthralgias, joint swelling and myalgias. Negative for back pain, gait problem, neck pain and neck stiffness.  Skin: Positive for wound.  Allergic/Immunologic: Negative for immunocompromised state.  Neurological: Negative for dizziness, weakness, numbness and headaches.  Hematological: Bruises/bleeds easily.    Physical Exam Updated Vital Signs BP (!) 170/77 (BP Location: Right Arm)   Pulse 71   Temp 98.1 F (36.7 C) (Oral)   Resp 18   Ht 5\' 7"  (1.702 m)   Wt 71.7 kg   LMP 02/18/1995   SpO2 100%   BMI 24.75 kg/m   Physical Exam Vitals and nursing note reviewed.  Constitutional:      General: She is not in acute distress.    Appearance: She is well-developed. She is not diaphoretic.  HENT:     Head: Normocephalic and atraumatic.    Cardiovascular:     Pulses: Normal pulses.  Pulmonary:     Effort: Pulmonary effort is normal.  Musculoskeletal:        General: Swelling and tenderness present. No deformity.       Arms:       Legs:  Skin:    General: Skin is warm and dry.     Capillary Refill: Capillary refill takes less than 2 seconds.     Findings: No erythema or rash.  Neurological:     Mental Status: She is alert and oriented to person, place, and time.     Sensory: No sensory deficit.  Psychiatric:        Behavior: Behavior normal.     ED Results / Procedures / Treatments   Labs (all labs ordered are listed, but only abnormal results are displayed) Labs Reviewed - No data to display  EKG None  Radiology DG Wrist Complete Left  Result Date: 06/18/2019 CLINICAL DATA:  Pt states that she fell onto concrete, pt co pain in radial side of wrist, swelling, limited rom EXAM: LEFT WRIST - COMPLETE 3+ VIEW COMPARISON:  None. FINDINGS: There is a minimally displaced transverse fracture in the distal left radius. No evidence of dislocation. There are degenerative changes at the [facility] joint. There is regional soft tissue swelling. IMPRESSION: Minimally displaced transverse fracture in the distal left radius. Electronically Signed   By: Audie Pinto M.D.   On: 06/18/2019 15:26   DG  Foot Complete Left  Result Date: 06/18/2019 CLINICAL DATA:  Fall on concrete. Pain in the great toe. EXAM: LEFT FOOT - COMPLETE 3+ VIEW COMPARISON:  None FINDINGS: Suspected nondisplaced fracture medially along the distal metaphysis of the proximal phalanx great toe extending into the medial articular surface. Admittedly the finding is subtle. Multipartite and irregular lateral sesamoid of the first digit, not felt to be acute. Small type 1 accessory navicular. No malalignment at the Lisfranc joint. Plantar and Achilles calcaneal spurs. IMPRESSION: 1. Subtle nondisplaced fracture of the distal metaphysis of the proximal phalanx great toe  with suspected intra-articular extension. Electronically Signed   By: Van Clines M.D.   On: 06/18/2019 15:32    Procedures Procedures (including critical care time)  Medications Ordered in ED Medications  HYDROcodone-acetaminophen (NORCO/VICODIN) 5-325 MG per tablet 1 tablet (has no administration in time range)    ED Course  I have reviewed the triage vital signs and the nursing notes.  Pertinent labs & imaging results that were available during my care of the patient were reviewed by me and considered in my medical decision making (see chart for details).  Clinical Course as of Jun 17 1608  Sat Jun 18, 2019  1608 63yo female with injuries from mechanical fall. Left wrist x-ray with non displaced distal radius fracture. Left great toe with fracture to proximal phalanx extending interarticularly. Plan is to place left arm in sugar tong splint with sling, left foot in post op shoe. Refer to orthopedics. Patient sees Waldemar Dickens, will refer to call office on Monday for an appointment. Given rx for Norco for pain, recommend ice and elevate.   [LM]    Clinical Course User Index [LM] Roque Lias   MDM Rules/Calculators/A&P                     SPLINT APPLICATION Date/Time: 99991111 PM Authorized by: Tacy Learn Consent: Verbal consent obtained. Risks and benefits: risks, benefits and alternatives were discussed Consent given by: patient Splint applied by: ER tech Location details: left arm sugar tong Splint type: sugar tong Supplies used: OCL, ace, webril Post-procedure: The splinted body part was neurovascularly unchanged following the procedure. Patient tolerance: Patient tolerated the procedure well with no immediate complications.    Final Clinical Impression(s) / ED Diagnoses Final diagnoses:  Closed fracture of distal end of left radius, unspecified fracture morphology, initial encounter  Closed nondisplaced fracture of proximal phalanx of left great toe,  initial encounter    Rx / DC Orders ED Discharge Orders         Ordered    HYDROcodone-acetaminophen (NORCO/VICODIN) 5-325 MG tablet  Every 4 hours PRN     06/18/19 1600           Roque Lias 06/18/19 1610    Wyvonnia Dusky, MD 06/18/19 [id number]

## 2019-06-18 NOTE — ED Triage Notes (Signed)
Pt reports she was running in her yard to shut a gate and tripped and fell on concrete. Pt c/o pain left great toe; left knee, left wrist. States she did not hit her head. Denies LOC

## 2019-06-19 NOTE — Telephone Encounter (Signed)
Yes, I would like to repeat her lab fasting on her June visit.   Donna Bullock

## 2019-06-20 DIAGNOSIS — M25532 Pain in left wrist: Secondary | ICD-10-CM | POA: Insufficient documentation

## 2019-06-20 NOTE — Telephone Encounter (Signed)
S/w pt is aware to fast upon returning ov.  Pt stated was running and tripped over nonexistent diving board, pt ended up in the ER with a broken toe and broken wrist.  Pt is waiting to see Dr. Tonita Cong.

## 2019-06-29 DIAGNOSIS — S52509A Unspecified fracture of the lower end of unspecified radius, initial encounter for closed fracture: Secondary | ICD-10-CM | POA: Insufficient documentation

## 2019-07-01 ENCOUNTER — Telehealth: Payer: Self-pay

## 2019-07-01 NOTE — Telephone Encounter (Signed)
Left message for pt to return call to triage RN. 

## 2019-07-01 NOTE — Telephone Encounter (Signed)
Patient is calling in regards to a UTI. Patient states she has broken her wrist and ankle and would prefer not to come in. Patient would like to know if a prescription can be called in instead.

## 2019-07-01 NOTE — Telephone Encounter (Signed)
AEX 04/2017 OV 10/11/2018 Last + UC 03/2018 Given Bactrim Rx in 02/2019 for UTI sx., neg UC, +urine dip PMP, no HRT H/o urinary incontinence  H/o vaginal atrophy  Spoke with pt. Pt reports having UTI sx x 1 day ago. Pt states having frequency, urgency, burning sensation with urination, and noticed having light pink blood when wiping and in toilet after urinating. Pt denies fever, chills, back pain, vaginal discharge or odor. Denies using pad or panty liner.  Pt states started taking AZO today. Pt denies taking any OTC meds like tylenol or ibuprofen. Pt requesting UTI meds to be called into pharmacy for sx due to recent ankle/wrist injury on 06/18/19. Pt advised needs OV before Rx. Pt agreeable. Pt informed Dr Talbert Nan not in office today, but offered with another provider for OV, pt declines. Pt requesting OV Monday with Dr Talbert Nan. Pt scheduled as work in appt on 07/04/19 at 1:15pm. Pt agreeable and verbalized understanding. Pt advised can see PCP or go to Urgent Care. Pt declines.   Routing to Dr Talbert Nan for review.  Encounter closed.

## 2019-07-04 ENCOUNTER — Telehealth: Payer: Self-pay | Admitting: Obstetrics and Gynecology

## 2019-07-04 ENCOUNTER — Ambulatory Visit: Payer: Medicare Other | Admitting: Obstetrics and Gynecology

## 2019-07-04 NOTE — Telephone Encounter (Signed)
Patient left a message on the answering machine canceling her uti appointment today and did not wish to reschedule. Marland Kitchen

## 2019-07-12 DIAGNOSIS — S92422A Displaced fracture of distal phalanx of left great toe, initial encounter for closed fracture: Secondary | ICD-10-CM | POA: Insufficient documentation

## 2019-07-20 NOTE — Progress Notes (Signed)
CARDIOLOGY OFFICE NOTE  Date:  08/03/2019    Donna Bullock Date of Birth: November 20, 1955 Medical Record [id number]  PCP:  Donna Gravel, MD  Cardiologist:  Donna Bullock    Chief Complaint  Patient presents with  . Follow-up    History of Present Illness: Donna Bullock is a 64 y.o. female who presents today for a 6 month check. Seen for Donna Bullock. She is a former patient of Donna Bullock that I previously cared for.She primarily follows with me.  She has a history of CAD, HLD, HTN, and tobacco abuse. Her CAD dates back to 1996 when she had a stent placed in her RCA. She had an anterior MI with cardiac arrest (VFib) in 2002. Cath showed a 75% distal LAD narrowing and this was managed conservatively with relook cath two days later showing 50% distal stenosis. She then had an inferior MI in 2003 with placement of overlapping Cypher drug eluting stents in the RCA. The LAD and Circumflex had mild disease per report. Four days later, she had severe chest pain, cath showed severe spasm of the LAD and Circumflex which resolved with IC vasodilators. Her last cath was in 2007 and showed patency of stents in the RCA and no other obstructive disease in the LAD and Circumflex.Remotestress test was in November 2009 and showed normal LVEF with no evidence of ischemia. She was seen in the ED 09/02/13 with bright red blood per rectum. Her ASA and Plavix were held. Colonoscopy 09/09/13 per Donna Bullock with polyps removed but no other bleeding source identified. Aspirin wasstopped but shehas continued to useExcedrin Migraine.   Her other issues include RA, HLD, HTN and past tobacco abuse along with depression/anxiety.  I had not seen her in 5 1/2 years until August of 2016. She had since gotten married -toMark Happy Bullock who I also see.She had stopped smoking. Some vague symptoms noted - did not want to have stress testing but then needed shoulder surgery and I got her Myoview updated -no  ischemia but EF a little down - better by echo from 2018 - she has been managed medically since.She has seen Donna Bullock for palpitations. She ended up having repeat cath back in 10/2017 (last study had been 12 years prior) due to chest pain - this was reassuring. Her prior anginal equivalent has never been typical - she presented once with a VF arrest. Her vasospasm was more of a choking sensation and she has SCAD twice.   Last seen in December - she was doing well.   The patient does not have symptoms concerning for COVID-19 infection (fever, chills, cough, or new shortness of breath).   Comes in today. Here with Donna Bullock - her husband. She is doing well. More active - despite a prior fall that caused a wrist fracture. That is improving. No chest pain. Breathing is good. No swelling. Weight is down 10 pounds - she has halved her portions and remains active. Tolerating her medicines. No real concerns. She feels like she is doing well.   Past Medical History:  Diagnosis Date  . Arthritis   . Benign tumor of adrenal gland   . CAD (coronary artery disease)    Inferior MI 1996 tx with bare metal stent RCA. Repeat  anterior MI 2002 with LAD vasospasm. Repeat inferior MI 2003 with occlusion RCA with overlapping Cypher DES in RCA. Repeat cath 2003 with profound vasospasm LAd and Circumflex. Repeat cath 2007  with patent RCA and no  disease in LAD or Circumflex.  . Depression   . Environmental allergies   . Family history of adverse reaction to anesthesia    mother had ? problem with anesthesia - was alcoholic - "mind was never right again"  . Family history of breast cancer   . Family history of leukemia   . Family history of stomach cancer   . GERD (gastroesophageal reflux disease)   . Headache    sinus headaches  . Heart murmur   . History of stomach ulcers    due to taking aspirin for headaches  . HTN (hypertension)   . Hyperlipidemia   . Myocardial infarction ([place])    times 4 "very minor"  stents x2   . Osteopenia   . Osteoporosis   . Rotator cuff tear    left  . Sleep apnea    uses c pap  . Urinary incontinence     Past Surgical History:  Procedure Laterality Date  . ABDOMINAL HYSTERECTOMY    . BACK SURGERY    . LAPAROSCOPIC LYSIS OF ADHESIONS    . LEFT HEART CATH AND CORONARY ANGIOGRAPHY N/A 11/16/2017   Procedure: LEFT HEART CATH AND CORONARY ANGIOGRAPHY;  Surgeon: Donna Crome, MD;  Location: [facility];  Service: Cardiovascular;  Laterality: N/A;  . MYOMECTOMY     age 19  . PELVIC ABCESS DRAINAGE    . SHOULDER OPEN ROTATOR CUFF REPAIR Left 11/16/2014   Procedure: LEFT MINI OPEN ROTATOR CUFF REPAIR SHOULDER OPEN WITH SUBACROMIAL DECOMPRESSION;  Surgeon: Donna Day, MD;  Location: [facility];  Service: Orthopedics;  Laterality: Left;     Medications: Current Meds  Medication Sig  . Acetaminophen-Caffeine (EXCEDRIN ASPIRIN FREE PO) Take 2 tablets by mouth daily.   . Artificial Tear Solution (SOOTHE XP) SOLN Place 1 drop into both eyes daily as needed (dry eyes).  . Calcium Citrate-Vitamin D (CITRACAL + D PO) Take 1 tablet by mouth 2 (two) times daily.  . clobetasol ointment (TEMOVATE) [id number] % Apply 1 application topically 2 (two) times daily. Apply as directed twice daily  . clopidogrel (PLAVIX) 75 MG tablet Take 1 tablet (75 mg total) by mouth daily.  Marland Kitchen diltiazem (CARDIZEM CD) 120 MG 24 hr capsule TAKE 1 CAPSULE BY MOUTH EVERY Bullock  . diphenhydrAMINE (BENADRYL) 25 MG tablet Take 25-50 mg by mouth daily as needed for allergies.  Marland Kitchen escitalopram (LEXAPRO) 10 MG tablet Take 1 tablet (10 mg total) by mouth daily.  Marland Kitchen GABAPENTIN PO Take by mouth.  Marland Kitchen HYDROcodone-acetaminophen (NORCO/VICODIN) 5-325 MG tablet Take 1 tablet by mouth every 4 (four) hours as needed.  . isosorbide mononitrate (IMDUR) 30 MG 24 hr tablet Take 0.5 tablets (15 mg total) by mouth at bedtime.  Marland Kitchen lisinopril (ZESTRIL) 20 MG tablet Take 1 tablet (20 mg total) by mouth daily.  . metoprolol  succinate (TOPROL-XL) 25 MG 24 hr tablet Take 1 tablet (25 mg total) by mouth daily.  . montelukast (SINGULAIR) 10 MG tablet Take 10 mg by mouth at bedtime.  . Multiple Vitamins-Minerals (CENTRUM SILVER PO) Take 1 tablet by mouth at bedtime.  . Multiple Vitamins-Minerals (PRESERVISION AREDS 2) CAPS Take 1 capsule by mouth 2 (two) times daily.  Marland Kitchen omeprazole (PRILOSEC) 20 MG capsule Take 1 capsule (20 mg total) by mouth daily.  Marland Kitchen PREDNISONE PO Take by mouth.  . Ranibizumab (LUCENTIS IO) Inject into the eye as directed. Eye injections once monthly- unsure of dose  . simvastatin (ZOCOR) 40 MG tablet Take 1  tablet (40 mg total) by mouth at bedtime.  . valACYclovir (VALTREX) 500 MG tablet TAKE 1 TABLET BY MOUTH TWICE A Bullock FOR 3 DAYS AS NEEDED     Allergies: Allergies  Allergen Reactions  . Other Hives and Diarrhea    Peppers   . Tomato Diarrhea  . Valium Other (See Comments)    Affects her behavioral    Social History: The patient  reports that she has been smoking cigarettes. She has been smoking about 1.00 pack per Bullock. She has never used smokeless tobacco. She reports that she does not drink alcohol and does not use drugs.   Family History: The patient's family history includes Alcohol abuse in her father and mother; Breast cancer (age of onset: 22) in her maternal grandmother; Breast cancer (age of onset: 59) in her cousin; Breast cancer (age of onset: 38) in her maternal aunt; Breast cancer (age of onset: 65) in her mother; Breast cancer (age of onset: 82) in her maternal aunt; Cancer (age of onset: 7) in her cousin; Cancer - Lung in her father; Leukemia (age of onset: 53) in her sister; Lung cancer in her father; Stomach cancer (age of onset: 74) in her paternal aunt.   Review of Systems: Please see the history of present illness.   All other systems are reviewed and negative.   Physical Exam: VS:  BP (!) 150/100   Pulse (!) 58   Ht 5\' 7"  (1.702 m)   Wt 159 lb 1.9 oz (72.2 kg)    LMP 02/18/1995   SpO2 97%   BMI 24.92 kg/m  .  BMI Body mass index is 24.92 kg/m.  Wt Readings from Last 3 Encounters:  08/03/19 159 lb 1.9 oz (72.2 kg)  06/18/19 158 lb (71.7 kg)  01/25/19 169 lb 12.8 oz (77 kg)    General: Pleasant. Alert and in no acute distress. Her weight is down 10 pounds.  Cardiac: Regular rate and rhythm. No murmurs, rubs, or gallops. No edema.  Respiratory:  Lungs are clear to auscultation bilaterally with normal work of breathing.  GI: Soft and nontender.  MS: No deformity or atrophy. Gait and ROM intact.  Skin: Warm and dry. Color is normal.  Neuro:  Strength and sensation are intact and no gross focal deficits noted.  Psych: Alert, appropriate and with normal affect.   LABORATORY DATA:  EKG:  EKG is not ordered today.    Lab Results  Component Value Date   WBC 8.9 01/25/2019   HGB 14.9 01/25/2019   HCT 44.0 01/25/2019   PLT 284 01/25/2019   GLUCOSE 92 01/25/2019   CHOL 130 01/25/2019   TRIG 186 (H) 01/25/2019   HDL 45 01/25/2019   LDLCALC 54 01/25/2019   ALT 14 01/25/2019   AST 18 01/25/2019   NA 145 (H) 01/25/2019   K 4.1 01/25/2019   CL 108 (H) 01/25/2019   CREATININE 0.65 01/25/2019   BUN 12 01/25/2019   CO2 25 01/25/2019   TSH 1.190 03/09/2017   INR 0.90 09/02/2013     BNP (last 3 results) No results for input(s): BNP in the last 8760 hours.  ProBNP (last 3 results) No results for input(s): PROBNP in the last 8760 hours.   Other Studies Reviewed Today:  LEFT HEART CATH AND CORONARY ANGIOGRAPHY9/30/2019  Conclusion    Review of prior images demonstrates distal LAD SCAD in December 2002 that healed spontaneously. 10 months later, October 2003, SCAD in distal RCA treated with stenting.  Left main  is normal  LAD is large and wraps around the apex. LAD is normal and previous site of SCAD 15 years ago is normal.  Circumflex coronary artery is normal.  RCA is widely patent. Segmental 30% proximal to mid  atherosclerotic disease. Mid to distal RCA stents are widely patent. The stents are in the distribution of previous SCAD October 2003.  Inferobasal akinesis. EF 45 to 50%. Normal LVEDP.  RECOMMENDATIONS:   Continue aggressive primary prevention/risk factor modification: Monitor glycemic control, blood pressure less than 130/80 mmHg, LDL less than 70, smoking cessation, and at least 150 minutes of moderate aerobic activity per week  Current symptoms not felt to represent ischemia/obstructive CAD.  Recommend Aspirin 81mg  daily for moderate CAD.   EchoStudy Conclusions8/2018  - Left ventricle: The cavity size was mildly dilated. Systolic function was normal. The estimated ejection fraction was in the range of 55% to 60%. Wall motion was normal; there were no regional wall motion abnormalities. Features are consistent with a pseudonormal left ventricular filling pattern, with concomitant abnormal relaxation and increased filling pressure (grade 2 diastolic dysfunction). Doppler parameters are consistent with high ventricular filling pressure. - Aortic valve: There was mild regurgitation. - Mitral valve: There was trivial regurgitation.   48 HR HolterStudy Highlights6/2018  Sinus rhythm with sinus bradycardia. Lowest heart rate 49 bpm at 4am.  Frequent premature ventricular contractions (760 during monitoring period) with several episodes of bigeminy.  Rare premature atrial contractions    ASSESSMENT & PLAN:   1. CAD - remote MI, prior SCAD and vasospasm, prior remote PCI to the distal RCA - her last cath was from 10/2017 and was reassuring. She is doing very well. No worrisome symptoms. Continue with CV risk factor modification.   2. Obesity - she is intentionally losing weight - encouragement given.   3. HTN - BP recheck by me is 134/80 - no changed with her medicines - would continue with Diltiazem, Lisinopril and Toprol.   4. HLD - on statin -  lab today.  5. Former smoker - resolved.   6. Fall - with wrist fracture - this is improving.   7. Prior mild LV dysfunction - last echo with normal EF - she is on ACE and beta blocker which we will continue. She has no symptoms.   8. Mild valvular heart disease - no symptoms - would follow.   Current medicines are reviewed with the patient today.  The patient does not have concerns regarding medicines other than what has been noted above.  The following changes have been made:  See above.  Labs/ tests ordered today include:    Orders Placed This Encounter  Procedures  . Basic metabolic panel  . CBC  . Hepatic function panel  . Lipid panel     Disposition:   FU with me in 6 months.   Patient is agreeable to this plan and will call if any problems develop in the interim.   SignedTruitt Merle, NP  08/03/2019 8:59 AM  [facility] [address] [place], [place]  [address] Phone: [phone] Fax: [phone]

## 2019-08-03 ENCOUNTER — Other Ambulatory Visit: Payer: Self-pay

## 2019-08-03 ENCOUNTER — Ambulatory Visit (INDEPENDENT_AMBULATORY_CARE_PROVIDER_SITE_OTHER): Payer: Medicare Other | Admitting: Nurse Practitioner

## 2019-08-03 ENCOUNTER — Encounter: Payer: Self-pay | Admitting: Nurse Practitioner

## 2019-08-03 VITALS — BP 150/100 | HR 58 | Ht 67.0 in | Wt 159.1 lb

## 2019-08-03 DIAGNOSIS — E785 Hyperlipidemia, unspecified: Secondary | ICD-10-CM | POA: Diagnosis not present

## 2019-08-03 DIAGNOSIS — Z79899 Other long term (current) drug therapy: Secondary | ICD-10-CM | POA: Diagnosis not present

## 2019-08-03 DIAGNOSIS — I10 Essential (primary) hypertension: Secondary | ICD-10-CM | POA: Diagnosis not present

## 2019-08-03 DIAGNOSIS — I259 Chronic ischemic heart disease, unspecified: Secondary | ICD-10-CM

## 2019-08-03 LAB — CBC
Hematocrit: 41.9 % (ref 34.0–46.6)
Hemoglobin: 14.4 g/dL (ref 11.1–15.9)
MCH: 30 pg (ref 26.6–33.0)
MCHC: 34.4 g/dL (ref 31.5–35.7)
MCV: 87 fL (ref 79–97)
Platelets: 244 10*3/uL (ref 150–450)
RBC: 4.8 x10E6/uL (ref 3.77–5.28)
RDW: 13.5 % (ref 11.7–15.4)
WBC: 8.4 10*3/uL (ref 3.4–10.8)

## 2019-08-03 LAB — HEPATIC FUNCTION PANEL
ALT: 17 IU/L (ref 0–32)
AST: 24 IU/L (ref 0–40)
Albumin: 4.4 g/dL (ref 3.8–4.8)
Alkaline Phosphatase: 108 IU/L (ref 48–121)
Bilirubin Total: 0.5 mg/dL (ref 0.0–1.2)
Bilirubin, Direct: 0.13 mg/dL (ref 0.00–0.40)
Total Protein: 6.8 g/dL (ref 6.0–8.5)

## 2019-08-03 LAB — BASIC METABOLIC PANEL
BUN/Creatinine Ratio: 17 (ref 12–28)
BUN: 14 mg/dL (ref 8–27)
CO2: 24 mmol/L (ref 20–29)
Calcium: 9.9 mg/dL (ref 8.7–10.3)
Chloride: 104 mmol/L (ref 96–106)
Creatinine, Ser: 0.81 mg/dL (ref 0.57–1.00)
GFR calc Af Amer: 88 mL/min/{1.73_m2} (ref >59)
GFR calc non Af Amer: 76 mL/min/{1.73_m2} (ref >59)
Glucose: 103 mg/dL — ABNORMAL HIGH (ref 65–99)
Potassium: 4.2 mmol/L (ref 3.5–5.2)
Sodium: 142 mmol/L (ref 134–144)

## 2019-08-03 LAB — LIPID PANEL
Chol/HDL Ratio: 2.3 ratio (ref 0.0–4.4)
Cholesterol, Total: 138 mg/dL (ref 100–199)
HDL: 61 mg/dL (ref >39)
LDL Chol Calc (NIH): 56 mg/dL (ref 0–99)
Triglycerides: 122 mg/dL (ref 0–149)
VLDL Cholesterol Cal: 21 mg/dL (ref 5–40)

## 2019-08-03 NOTE — Patient Instructions (Addendum)
After Visit Summary:  We will be checking the following labs today - BMET, CBC, HPF and Lipids   Medication Instructions:    Continue with your current medicines.    If you need a refill on your cardiac medications before your next appointment, please call your pharmacy.     Testing/Procedures To Be Arranged:  N/A  Follow-Up:   See me in 6 months.     At [facility], you and your health needs are our priority.  As part of our continuing mission to provide you with exceptional heart care, we have created designated Provider Care Teams.  These Care Teams include your primary Cardiologist (physician) and Advanced Practice Providers (APPs -  Physician Assistants and Nurse Practitioners) who all work together to provide you with the care you need, when you need it.  Special Instructions:  . Stay safe, wash your hands for at least 20 seconds and wear a mask when needed.  . It was good to talk with you today.  Marland Kitchen Keep up the good work!!   Call the [place] office at [phone] if you have any questions, problems or concerns.

## 2019-08-14 ENCOUNTER — Other Ambulatory Visit: Payer: Self-pay | Admitting: Nurse Practitioner

## 2019-08-19 ENCOUNTER — Other Ambulatory Visit: Payer: Self-pay | Admitting: Urology

## 2019-08-19 ENCOUNTER — Encounter (HOSPITAL_COMMUNITY): Payer: Self-pay | Admitting: Urology

## 2019-08-19 ENCOUNTER — Other Ambulatory Visit: Payer: Self-pay

## 2019-08-19 ENCOUNTER — Other Ambulatory Visit (HOSPITAL_COMMUNITY): Admission: RE | Admit: 2019-08-19 | Payer: Medicare Other | Source: Ambulatory Visit

## 2019-08-19 NOTE — Progress Notes (Signed)
Anesthesia Chart Review:   Case: [id number] Date/Time: 08/24/19 1245   Procedure: CYSTOSCOPY WITH LEFT URETEROSCOPY AND POSSIBLE  STENT PLACEMENT (Left )   Anesthesia type: General   Pre-op diagnosis: LEFT DISTAL STONE   [facility]   Surgeons: Ardis Hughs, MD      DISCUSSION: Pt is a 64 year old with hx CAD (stent to RCA 2003), VF arrest, SCAD x2, HTN, OSA. Current smoker.   Last cardiology eval 08/03/19 reassuring: no worrisome CV symptoms. Cath from 2019 showed two areas of prior SCAD had healed, RCA stents widely patent, EF 45-50%    PROVIDERS: - PCP is Jani Gravel, MD - Cardiologist is Lauree Chandler, MD & Truitt Merle, NP. Last office visit with Ms. Servando Snare 08/03/19   LABS: Will be obtained day of surgery    EKG 01/25/19: sinus bradycardia. Nonspecific ST/T wave changes.    CV: Cardiac cath 11/16/17:   Review of prior images demonstrates distal LAD SCAD in December 2002 that healed spontaneously.  10 months later, October 2003, SCAD in distal RCA treated with stenting.  Left main is normal  LAD is large and wraps around the apex.  LAD is normal and previous site of SCAD 15 years ago is normal.  Circumflex coronary artery is normal.  RCA is widely patent.  Segmental 30% proximal to mid atherosclerotic disease.  Mid to distal RCA stents are widely patent.  The stents are in the distribution of previous SCAD October 2003.  Inferobasal akinesis.  EF 45 to 50%.  Normal LVEDP. RECOMMENDATIONS:  Continue aggressive primary prevention/risk factor modification: Monitor glycemic control, blood pressure less than 130/80 mmHg, LDL less than 70, smoking cessation, and at least 150 minutes of moderate aerobic activity per week  Current symptoms not felt to represent ischemia/obstructive CAD.   Echo 09/25/16:  - Left ventricle: The cavity size was mildly dilated. Systolic function was normal. The estimated ejection fraction was in the range of 55%  to 60%. Wall motion was normal; there were no regional wall motion abnormalities. Features are consistent with a pseudonormal left ventricular filling pattern, with concomitant abnormal relaxation and increased filling pressure (grade 2 diastolic dysfunction). Doppler parameters are consistent with high ventricular filling pressure.  - Aortic valve: There was mild regurgitation.  - Mitral valve: There was trivial regurgitation.   Holter monitor 08/12/16:  - Sinus rhythm with sinus bradycardia. Lowest heart rate 49 bpm at 4am.  - Frequent premature ventricular contractions (760 during monitoring period) with several episodes of bigeminy.  - Rare premature atrial contractions   Past Medical History:  Diagnosis Date  . Arthritis    rheumatoid arthritis, and osteoarthritis   . Benign tumor of adrenal gland   . CAD (coronary artery disease)    Inferior MI 1996 tx with bare metal stent RCA. Repeat  anterior MI 2002 with LAD vasospasm. Repeat inferior MI 2003 with occlusion RCA with overlapping Cypher DES in RCA. Repeat cath 2003 with profound vasospasm LAd and Circumflex. Repeat cath 2007  with patent RCA and no disease in LAD or Circumflex.  . Depression   . Environmental allergies   . Family history of adverse reaction to anesthesia    mother had ? problem with anesthesia - was alcoholic - "mind was never right again"  . Family history of breast cancer   . Family history of leukemia   . Family history of stomach cancer   . Fibromyalgia   . GERD (gastroesophageal reflux disease)   .  Heart murmur   . History of kidney stones   . History of stomach ulcers    due to taking aspirin for headaches  . HTN (hypertension)   . Hyperlipidemia   . Myocardial infarction ([place])    times 4 "very minor" stents x2   . Osteopenia   . Osteoporosis   . Rotator cuff tear    left  . Sleep apnea    uses c pap - does not know settings   . Urinary incontinence     Past Surgical History:  Procedure  Laterality Date  . ABDOMINAL HYSTERECTOMY    . BACK SURGERY    . LAPAROSCOPIC LYSIS OF ADHESIONS    . LEFT HEART CATH AND CORONARY ANGIOGRAPHY N/A 11/16/2017   Procedure: LEFT HEART CATH AND CORONARY ANGIOGRAPHY;  Surgeon: Belva Crome, MD;  Location: [facility];  Service: Cardiovascular;  Laterality: N/A;  . MYOMECTOMY     age 57  . PELVIC ABCESS DRAINAGE    . SHOULDER OPEN ROTATOR CUFF REPAIR Left 11/16/2014   Procedure: LEFT MINI OPEN ROTATOR CUFF REPAIR SHOULDER OPEN WITH SUBACROMIAL DECOMPRESSION;  Surgeon: Susa Day, MD;  Location[facility];  Service: Orthopedics;  Laterality: Left;    MEDICATIONS: No current facility-administered medications for this encounter.   . cephALEXin (KEFLEX) 500 MG capsule  . tamsulosin (FLOMAX) 0.4 MG CAPS capsule  . Acetaminophen-Caffeine (EXCEDRIN ASPIRIN FREE PO)  . Artificial Tear Solution (SOOTHE XP) SOLN  . Calcium Citrate-Vitamin D (CITRACAL + D PO)  . clobetasol ointment (TEMOVATE) 0.05 %  . clopidogrel (PLAVIX) 75 MG tablet  . diltiazem (CARDIZEM CD) 120 MG 24 hr capsule  . diphenhydrAMINE (BENADRYL) 25 MG tablet  . escitalopram (LEXAPRO) 10 MG tablet  . GABAPENTIN PO  . HYDROcodone-acetaminophen (NORCO/VICODIN) 5-325 MG tablet  . isosorbide mononitrate (IMDUR) 30 MG 24 hr tablet  . lisinopril (ZESTRIL) 20 MG tablet  . metoprolol succinate (TOPROL-XL) 25 MG 24 hr tablet  . montelukast (SINGULAIR) 10 MG tablet  . Multiple Vitamins-Minerals (CENTRUM SILVER PO)  . Multiple Vitamins-Minerals (PRESERVISION AREDS 2) CAPS  . omeprazole (PRILOSEC) 20 MG capsule  . PREDNISONE PO  . Ranibizumab (LUCENTIS IO)  . simvastatin (ZOCOR) 40 MG tablet  . valACYclovir (VALTREX) 500 MG tablet    If labs acceptable day of surgery and no internal change, I anticipate pt can proceed with surgery as scheduled.  Willeen Cass, FNP-BC [facility]/Anesthesiology Phone: [phone] 08/19/2019 1:41 PM

## 2019-08-19 NOTE — Progress Notes (Addendum)
Anesthesia Review: 1996- Stent to RCA  2002- anterior MI with Vfib arrest  2003-Inferior Mi - 2 stents to RCA  Smokes 1/2 ppd  PCP: Dr Jani Gravel  Cardiologist : DR [name]  LOV- with Truitt Merle- 08/03/2019 - epic  Chest x-ray : EKG :01/25/2019 -epic  Echo :2018 - epic  Cardiac Cath : 2019-epic  Activity level: Flight of staris without difficulty  Sleep Study/ CPAP :cpap machine - does not know settings  Fasting Blood Sugar :      / Checks Blood Sugar -- times a day:   Blood Thinner/ Instructions /Last Dose: ASA / Instructions/ Last Dose :  Plavix- per pt last dose 5 days prior to procedure  CBC and CMP done 08/03/19- epic

## 2019-08-20 ENCOUNTER — Other Ambulatory Visit (HOSPITAL_COMMUNITY)
Admission: RE | Admit: 2019-08-20 | Discharge: 2019-08-20 | Disposition: A | Discharge status: Home / Self Care | Payer: Medicare Other | Source: Ambulatory Visit | Attending: Urology | Admitting: Urology

## 2019-08-20 DIAGNOSIS — Z01812 Encounter for preprocedural laboratory examination: Secondary | ICD-10-CM | POA: Insufficient documentation

## 2019-08-20 DIAGNOSIS — Z20822 Contact with and (suspected) exposure to covid-19: Secondary | ICD-10-CM | POA: Insufficient documentation

## 2019-08-20 LAB — SARS CORONAVIRUS 2 (TAT 6-24 HRS): SARS Coronavirus 2: NEGATIVE

## 2019-08-23 NOTE — Anesthesia Preprocedure Evaluation (Addendum)
Anesthesia Evaluation  Patient identified by MRN, date of birth, ID band Patient awake    Reviewed: Allergy & Precautions, NPO status , Patient's Chart, lab work & pertinent test results  Airway Mallampati: II  TM Distance: >3 FB Neck ROM: Full    Dental  (+) Teeth Intact, Dental Advisory Given   Pulmonary sleep apnea and Continuous Positive Airway Pressure Ventilation , Current Smoker and Patient abstained from smoking.,    breath sounds clear to auscultation       Cardiovascular hypertension, Pt. on medications and Pt. on home beta blockers + CAD, + Past MI and + Cardiac Stents   Rhythm:Regular Rate:Normal  10/05/16 Echo  Left ventricle: The cavity size was mildly dilated. Systolic  function was normal. The estimated ejection fraction was in the  range of 55% to 60%. Wall motion was normal; there were no  regional wall motion abnormalities. Features are consistent with  a pseudonormal left ventricular filling pattern, with concomitant  abnormal relaxation and increased filling pressure (grade 2  diastolic dysfunction). Doppler parameters are consistent with  high ventricular filling pressure.  - Aortic valve: There was mild regurgitation.    Neuro/Psych PSYCHIATRIC DISORDERS Depression  Neuromuscular disease    GI/Hepatic Neg liver ROS, GERD  Medicated,  Endo/Other  negative endocrine ROS  Renal/GU negative Renal ROS     Musculoskeletal  (+) Arthritis , Fibromyalgia -  Abdominal Normal abdominal exam  (+)   Peds  Hematology negative hematology ROS (+) Lab Results      Component                Value               Date                      WBC                      8.4                 08/03/2019                HGB                      14.4                08/03/2019                HCT                      41.9                08/03/2019                MCV                      87                   08/03/2019                PLT                      244                 08/03/2019              Anesthesia Other Findings   Reproductive/Obstetrics  Anesthesia Physical Anesthesia Plan  ASA: III  Anesthesia Plan: General   Post-op Pain Management:    Induction: Intravenous  PONV Risk Score and Plan: 3 and Treatment may vary due to age or medical condition, Ondansetron, Dexamethasone and Midazolam  Airway Management Planned: LMA  Additional Equipment: None  Intra-op Plan:   Post-operative Plan: Extubation in OR  Informed Consent: I have reviewed the patients History and Physical, chart, labs and discussed the procedure including the risks, benefits and alternatives for the proposed anesthesia with the patient or authorized representative who has indicated his/her understanding and acceptance.     Dental advisory given  Plan Discussed with: CRNA  Anesthesia Plan Comments:       Anesthesia Quick Evaluation

## 2019-08-24 ENCOUNTER — Ambulatory Visit (HOSPITAL_COMMUNITY): Payer: Medicare Other

## 2019-08-24 ENCOUNTER — Encounter (HOSPITAL_COMMUNITY): Payer: Self-pay | Admitting: Urology

## 2019-08-24 ENCOUNTER — Ambulatory Visit (HOSPITAL_COMMUNITY)
Admission: RE | Admit: 2019-08-24 | Discharge: 2019-08-24 | Disposition: A | Discharge status: Home / Self Care | Payer: Medicare Other | Attending: Urology | Admitting: Urology

## 2019-08-24 ENCOUNTER — Ambulatory Visit (HOSPITAL_COMMUNITY): Payer: Medicare Other | Admitting: Emergency Medicine

## 2019-08-24 ENCOUNTER — Encounter (HOSPITAL_COMMUNITY)
Admission: RE | Disposition: A | Discharge status: Home / Self Care | Payer: Self-pay | Source: Home / Self Care | Attending: Urology

## 2019-08-24 ENCOUNTER — Telehealth: Payer: Self-pay | Admitting: *Deleted

## 2019-08-24 DIAGNOSIS — R310 Gross hematuria: Secondary | ICD-10-CM

## 2019-08-24 DIAGNOSIS — N201 Calculus of ureter: Secondary | ICD-10-CM | POA: Diagnosis not present

## 2019-08-24 DIAGNOSIS — Z955 Presence of coronary angioplasty implant and graft: Secondary | ICD-10-CM | POA: Diagnosis not present

## 2019-08-24 DIAGNOSIS — Z8674 Personal history of sudden cardiac arrest: Secondary | ICD-10-CM | POA: Diagnosis not present

## 2019-08-24 DIAGNOSIS — M1990 Unspecified osteoarthritis, unspecified site: Secondary | ICD-10-CM | POA: Diagnosis not present

## 2019-08-24 DIAGNOSIS — Z7982 Long term (current) use of aspirin: Secondary | ICD-10-CM | POA: Diagnosis not present

## 2019-08-24 DIAGNOSIS — I2510 Atherosclerotic heart disease of native coronary artery without angina pectoris: Secondary | ICD-10-CM | POA: Diagnosis not present

## 2019-08-24 DIAGNOSIS — Z79899 Other long term (current) drug therapy: Secondary | ICD-10-CM | POA: Diagnosis not present

## 2019-08-24 DIAGNOSIS — F329 Major depressive disorder, single episode, unspecified: Secondary | ICD-10-CM | POA: Insufficient documentation

## 2019-08-24 DIAGNOSIS — G4730 Sleep apnea, unspecified: Secondary | ICD-10-CM | POA: Insufficient documentation

## 2019-08-24 DIAGNOSIS — I10 Essential (primary) hypertension: Secondary | ICD-10-CM | POA: Insufficient documentation

## 2019-08-24 DIAGNOSIS — Z87891 Personal history of nicotine dependence: Secondary | ICD-10-CM | POA: Diagnosis not present

## 2019-08-24 HISTORY — PX: CYSTOSCOPY WITH URETEROSCOPY AND STENT PLACEMENT: SHX6377

## 2019-08-24 HISTORY — DX: Personal history of urinary calculi: Z87.442

## 2019-08-24 HISTORY — DX: Fibromyalgia: M79.7

## 2019-08-24 LAB — BASIC METABOLIC PANEL
Anion gap: 9 (ref 5–15)
BUN: 13 mg/dL (ref 8–23)
CO2: 24 mmol/L (ref 22–32)
Calcium: 9 mg/dL (ref 8.9–10.3)
Chloride: 108 mmol/L (ref 98–111)
Creatinine, Ser: 0.74 mg/dL (ref 0.44–1.00)
GFR calc Af Amer: >60 mL/min (ref >60)
GFR calc non Af Amer: >60 mL/min (ref >60)
Glucose, Bld: 107 mg/dL — ABNORMAL HIGH (ref 70–99)
Potassium: 3.7 mmol/L (ref 3.5–5.1)
Sodium: 141 mmol/L (ref 135–145)

## 2019-08-24 LAB — CBC
HCT: 44.8 % (ref 36.0–46.0)
Hemoglobin: 14.7 g/dL (ref 12.0–15.0)
MCH: 30.7 pg (ref 26.0–34.0)
MCHC: 32.8 g/dL (ref 30.0–36.0)
MCV: 93.5 fL (ref 80.0–100.0)
Platelets: 235 10*3/uL (ref 150–400)
RBC: 4.79 MIL/uL (ref 3.87–5.11)
RDW: 13.9 % (ref 11.5–15.5)
WBC: 7.3 10*3/uL (ref 4.0–10.5)
nRBC: 0 % (ref 0.0–0.2)

## 2019-08-24 SURGERY — CYSTOURETEROSCOPY, WITH STENT INSERTION
Anesthesia: General | Laterality: Left

## 2019-08-24 MED ORDER — MIDAZOLAM HCL 2 MG/2ML IJ SOLN
INTRAMUSCULAR | Status: DC | PRN
  Administered 2019-08-24: 2 mg via INTRAVENOUS

## 2019-08-24 MED ORDER — LACTATED RINGERS IV SOLN: INTRAVENOUS | Status: DC

## 2019-08-24 MED ORDER — PROPOFOL 10 MG/ML IV BOLUS
INTRAVENOUS | Status: AC
  Filled 2019-08-24: qty 20

## 2019-08-24 MED ORDER — SODIUM CHLORIDE 0.9 % IV SOLN
INTRAVENOUS | Status: DC | PRN
  Administered 2019-08-24: 8 mL

## 2019-08-24 MED ORDER — BELLADONNA ALKALOIDS-OPIUM 16.2-30 MG RE SUPP
RECTAL | Status: AC
  Filled 2019-08-24: qty 1

## 2019-08-24 MED ORDER — SODIUM CHLORIDE 0.9 % IR SOLN
Status: DC | PRN
  Administered 2019-08-24 (×2): 3000 mL

## 2019-08-24 MED ORDER — MIDAZOLAM HCL 2 MG/2ML IJ SOLN
INTRAMUSCULAR | Status: AC
  Filled 2019-08-24: qty 2

## 2019-08-24 MED ORDER — LIDOCAINE 2% (20 MG/ML) 5 ML SYRINGE
INTRAMUSCULAR | Status: AC
  Filled 2019-08-24: qty 5

## 2019-08-24 MED ORDER — FENTANYL CITRATE (PF) 100 MCG/2ML IJ SOLN
INTRAMUSCULAR | Status: DC | PRN
  Administered 2019-08-24: 50 ug via INTRAVENOUS
  Administered 2019-08-24: 25 ug via INTRAVENOUS
  Administered 2019-08-24: 50 ug via INTRAVENOUS

## 2019-08-24 MED ORDER — PROPOFOL 10 MG/ML IV BOLUS
INTRAVENOUS | Status: DC | PRN
  Administered 2019-08-24: 30 mg via INTRAVENOUS
  Administered 2019-08-24: 200 mg via INTRAVENOUS
  Administered 2019-08-24: 70 mg via INTRAVENOUS

## 2019-08-24 MED ORDER — PHENAZOPYRIDINE HCL 200 MG PO TABS
200.0000 mg | ORAL_TABLET | Freq: Three times a day (TID) | ORAL | 0 refills | Status: DC | PRN
Start: 2019-08-24 — End: 2020-02-01

## 2019-08-24 MED ORDER — CIPROFLOXACIN HCL 500 MG PO TABS
500.0000 mg | ORAL_TABLET | Freq: Once | ORAL | 0 refills | Status: AC
Start: 2019-08-24 — End: 2019-08-24

## 2019-08-24 MED ORDER — ORAL CARE MOUTH RINSE: 15.0000 mL | Freq: Once | OROMUCOSAL | Status: AC

## 2019-08-24 MED ORDER — LIDOCAINE HCL URETHRAL/MUCOSAL 2 % EX GEL
CUTANEOUS | Status: AC
  Filled 2019-08-24: qty 30

## 2019-08-24 MED ORDER — CEFAZOLIN SODIUM-DEXTROSE 2-4 GM/100ML-% IV SOLN
2.0000 g | INTRAVENOUS | Status: AC
  Administered 2019-08-24: 2 g via INTRAVENOUS
  Filled 2019-08-24: qty 100

## 2019-08-24 MED ORDER — GLYCOPYRROLATE PF 0.2 MG/ML IJ SOSY
PREFILLED_SYRINGE | INTRAMUSCULAR | Status: AC
  Filled 2019-08-24: qty 1

## 2019-08-24 MED ORDER — LIDOCAINE HCL URETHRAL/MUCOSAL 2 % EX GEL
CUTANEOUS | Status: DC | PRN
  Administered 2019-08-24: 1

## 2019-08-24 MED ORDER — DEXAMETHASONE SODIUM PHOSPHATE 10 MG/ML IJ SOLN
INTRAMUSCULAR | Status: DC | PRN
  Administered 2019-08-24: 4 mg via INTRAVENOUS

## 2019-08-24 MED ORDER — GLYCOPYRROLATE 0.2 MG/ML IJ SOLN
INTRAMUSCULAR | Status: DC | PRN
  Administered 2019-08-24 (×2): .1 mg via INTRAVENOUS

## 2019-08-24 MED ORDER — DEXAMETHASONE SODIUM PHOSPHATE 10 MG/ML IJ SOLN
INTRAMUSCULAR | Status: AC
  Filled 2019-08-24: qty 1

## 2019-08-24 MED ORDER — CHLORHEXIDINE GLUCONATE 0.12 % MT SOLN
15.0000 mL | Freq: Once | OROMUCOSAL | Status: AC
  Administered 2019-08-24: 15 mL via OROMUCOSAL

## 2019-08-24 MED ORDER — LIDOCAINE 2% (20 MG/ML) 5 ML SYRINGE
INTRAMUSCULAR | Status: DC | PRN
  Administered 2019-08-24: 40 mg via INTRAVENOUS

## 2019-08-24 MED ORDER — ONDANSETRON HCL 4 MG/2ML IJ SOLN
INTRAMUSCULAR | Status: DC | PRN
  Administered 2019-08-24: 4 mg via INTRAVENOUS

## 2019-08-24 MED ORDER — BELLADONNA ALKALOIDS-OPIUM 16.2-60 MG RE SUPP
RECTAL | Status: DC | PRN
  Administered 2019-08-24: 1 via RECTAL

## 2019-08-24 MED ORDER — FENTANYL CITRATE (PF) 100 MCG/2ML IJ SOLN
INTRAMUSCULAR | Status: AC
  Filled 2019-08-24: qty 2

## 2019-08-24 MED ORDER — ONDANSETRON HCL 4 MG/2ML IJ SOLN
INTRAMUSCULAR | Status: AC
  Filled 2019-08-24: qty 2

## 2019-08-24 SURGICAL SUPPLY — 21 items
BAG URO CATCHER STRL LF (MISCELLANEOUS) ×3 IMPLANT
BASKET ZERO TIP NITINOL 2.4FR (BASKET) IMPLANT
BSKT STON RTRVL ZERO TP 2.4FR (BASKET)
CATH URET 5FR 28IN OPEN ENDED (CATHETERS) ×3 IMPLANT
CLOTH BEACON ORANGE TIMEOUT ST (SAFETY) ×3 IMPLANT
EXTRACTOR STONE 1.7FRX115CM (UROLOGICAL SUPPLIES) ×2 IMPLANT
GLOVE BIOGEL M STRL SZ7.5 (GLOVE) ×3 IMPLANT
GOWN STRL REUS W/TWL XL LVL3 (GOWN DISPOSABLE) ×3 IMPLANT
GUIDEWIRE ANG ZIPWIRE 038X150 (WIRE) IMPLANT
GUIDEWIRE STR DUAL SENSOR (WIRE) ×5 IMPLANT
KIT BALLN UROMAX 15FX4 (MISCELLANEOUS) IMPLANT
KIT BALLN UROMAX 26 75X4 (MISCELLANEOUS) ×3
KIT TURNOVER KIT A (KITS) IMPLANT
MANIFOLD NEPTUNE II (INSTRUMENTS) ×3 IMPLANT
PACK CYSTO (CUSTOM PROCEDURE TRAY) ×3 IMPLANT
SHEATH URETERAL 12FRX28CM (UROLOGICAL SUPPLIES) IMPLANT
SHEATH URETERAL 12FRX35CM (MISCELLANEOUS) ×2 IMPLANT
STENT URET 6FRX24 CONTOUR (STENTS) ×2 IMPLANT
TUBING CONNECTING 10 (TUBING) ×2 IMPLANT
TUBING CONNECTING 10' (TUBING) ×1
TUBING UROLOGY SET (TUBING) ×3 IMPLANT

## 2019-08-24 NOTE — Interval H&P Note (Signed)
History and Physical Interval Note:  08/24/2019 11:46 AM  Donna Bullock  has presented today for surgery, with the diagnosis of LEFT DISTAL STONE.  The various methods of treatment have been discussed with the patient and family. After consideration of risks, benefits and other options for treatment, the patient has consented to  Procedure(s): CYSTOSCOPY WITH LEFT URETEROSCOPY AND POSSIBLE  STENT PLACEMENT (Left) as a surgical intervention.  The patient's history has been reviewed, patient examined, no change in status, stable for surgery.  I have reviewed the patient's chart and labs.  Questions were answered to the patient's satisfaction.     Ardis Hughs

## 2019-08-24 NOTE — Transfer of Care (Signed)
Immediate Anesthesia Transfer of Care Note  Patient: Donna Bullock  Procedure(s) Performed: CYSTOSCOPY WITH DIAGNOSTIC URETEROSCOPY , REMOVAL OF STONES AND LEFT  STENT PLACEMENT (Left )  Patient Location: PACU  Anesthesia Type:General  Level of Consciousness: awake, alert  and patient cooperative  Airway & Oxygen Therapy: Patient Spontanous Breathing and Patient connected to face mask oxygen  Post-op Assessment: Report given to RN and Post -op Vital signs reviewed and stable  Post vital signs: Reviewed and stable  Last Vitals:  Vitals Value Taken Time  BP 140/63 08/24/19 1333  Temp    Pulse 67 08/24/19 1335  Resp 17 08/24/19 1335  SpO2 100 % 08/24/19 1335  Vitals shown include unvalidated device data.  Last Pain:  Vitals:   08/24/19 1058  TempSrc:   PainSc: 0-No pain         Complications: No complications documented.

## 2019-08-24 NOTE — H&P (Signed)
hematuria two days ago. She reports dark red blood that fills the commode, suprapubic discomfort and leakage of urine. She denies clots. She denies fevers, chills, nausea, vomiting and flank pain. She currently takes Plavix and is a 1 pack per day smoker. She stopped taking her plavix when she noticed the blood in her urine.   08/18/19: 64 year old woman with a past medical history of current 1ppd smoker, cardiac disease (on plavix), urinary frequency. She presented to the clinic on 08/05/19 with gross hematuria, dysuria, frequency and supapubic pain. She was extremely concerned about bladder cancer as the etiology. a CT hematuria was obtained which revealed two non-obstructing distal left ureteral stones 6mm and 79mm. There were multiple attempts to reach her by phone however, they were unsuccessful. She presents today with worsening frequency, urgency and gross hematuria. She denies fevers, chills, flank pain and nausea.     ALLERGIES: Wellbutrin TABS    MEDICATIONS: Adult Aspirin Low Strength 81 MG TBDP Oral  Atenolol 25 mg tablet Oral  Ciprofloxacin Hcl 250 mg tablet 0 Oral Twice daily  Clopidogrel 75 mg tablet Oral  DilTIAZem HCl ER Coated Beads 120 MG Oral Capsule Extended Release 24 Hour Oral  Escitalopram Oxalate 10 mg tablet Oral  Isosorbide Mononitrate Er 30 mg tablet, extended release 24 hr Oral  Lisinopril 20 mg tablet Oral  Simvastatin 40 mg tablet Oral  Valacyclovir 1,000 mg tablet Oral     GU PSH: Complex cystometrogram, w/ void pressure and urethral pressure profile studies, any technique - 2019 Complex Uroflow - 2019 Emg surf Electrd - 2019 Hysterectomy Unilat SO - 2013 Inject For cystogram - 2019 Intrabd voidng Press - 2019 Locm 300-399Mg /Ml Iodine,1Ml - 08/05/2019       PSH Notes: Hysterectomy, Back Surgery   NON-GU PSH: None   GU PMH: Gross hematuria - 08/05/2019 Mixed incontinence - 2019 Chronic cystitis (w/o hematuria), Chronic cystitis - 2014 Nocturia, Nocturia -  2014 Stress Incontinence, Female stress incontinence - 2014 Urinary Frequency, Increased urinary frequency - 2014      PMH Notes:  2011-12-30 13:56:23 - Note: Cardiac Arrest  2011-12-30 13:56:23 - Note: Arthritis   Sleep Apnea   NON-GU PMH: Gastric ulcer, unspecified as acute or chronic, without hemorrhage or perforation, Gastric Ulcer - 2014 Personal history of other diseases of the circulatory system, History of cardiac disorder - 2014, History of hypertension, - 2014 Personal history of other diseases of the nervous system and sense organs, History of sleep apnea - 2014 Personal history of other endocrine, nutritional and metabolic disease, History of hypercholesterolemia - 2014 Personal history of other specified conditions, History of heartburn - 2014 Encounter for general adult medical examination without abnormal findings, Encounter for preventive health examination GERD Hypercholesterolemia Hypertension    FAMILY HISTORY: Cancer - Runs In Family   SOCIAL HISTORY: Marital Status: Married Ethnicity: Not Hispanic Or Latino; Race: White Current Smoking Status: Patient does not smoke anymore. Has not smoked since 06/18/2011. Smoked for 30 years.   Tobacco Use Assessment Completed: Used Tobacco in last 30 days? Has never drank.  Drinks 1 caffeinated drink per day.    REVIEW OF SYSTEMS:    GU Review Female:   Patient reports frequent urination, hard to postpone urination, burning /pain with urination, leakage of urine, and trouble starting your stream. Patient denies get up at night to urinate, stream starts and stops, have to strain to urinate, and being pregnant.  Gastrointestinal (Upper):   Patient denies nausea, vomiting, and indigestion/ heartburn.  Gastrointestinal (Lower):   Patient denies diarrhea and constipation.  Constitutional:   Patient denies fever, night sweats, weight loss, and fatigue.  Skin:   Patient denies skin rash/ lesion and itching.  Eyes:   Patient  denies blurred vision and double vision.  Ears/ Nose/ Throat:   Patient denies sore throat and sinus problems.  Hematologic/Lymphatic:   Patient denies swollen glands and easy bruising.  Cardiovascular:   Patient denies leg swelling and chest pains.  Respiratory:   Patient denies shortness of breath and cough.  Endocrine:   Patient denies excessive thirst.  Musculoskeletal:   Patient denies back pain and joint pain.  Neurological:   Patient denies headaches and dizziness.  Psychologic:   Patient denies depression and anxiety.   VITAL SIGNS:      08/18/2019 02:13 PM  Weight 160 lb / 72.57 kg  Height 67 in / 170.18 cm  BP 138/80 mmHg  Pulse 74 /min  Temperature 97.8 F / 36.5 C  BMI 25.1 kg/m   MULTI-SYSTEM PHYSICAL EXAMINATION:    Constitutional: Thin. No physical deformities. Normally developed. Good grooming.   Respiratory: No labored breathing, no use of accessory muscles.   Cardiovascular: Normal temperature, normal extremity pulses, no swelling, no varicosities.  Skin: No paleness, no jaundice, no cyanosis. No lesion, no ulcer, no rash.  Neurologic / Psychiatric: Oriented to time, oriented to place, oriented to person. No depression, no anxiety, no agitation.  Musculoskeletal: Normal gait and station of head and neck.     Complexity of Data:  Source Of History:  Patient, Family/Caregiver, Medical Record Summary  Records Review:   Previous Doctor Records, Previous Patient Records  Urine Test Review:   Urinalysis, Urine Culture  X-Ray Review: C.T. Abdomen/Pelvis: Reviewed Films. Discussed With Patient.     08/18/19  Urinalysis  Urine Appearance Cloudy   Urine Color Yellow   Urine Glucose Neg mg/dL  Urine Bilirubin Neg mg/dL  Urine Ketones Neg mg/dL  Urine Specific Gravity 1.015   Urine Blood Trace ery/uL  Urine pH 7.5   Urine Protein Neg mg/dL  Urine Urobilinogen 0.2 mg/dL  Urine Nitrites Neg   Urine Leukocyte Esterase 1+ leu/uL  Urine WBC/hpf 6 - 10/hpf   Urine  RBC/hpf 0 - 2/hpf   Urine Epithelial Cells NS (Not Seen)   Urine Bacteria Few (10-25/hpf)   Urine Mucous Not Present   Urine Yeast NS (Not Seen)   Urine Trichomonas Not Present   Urine Cystals NS (Not Seen)   Urine Casts NS (Not Seen)   Urine Sperm Not Present   Urine C&S  Culture, Urine -    PROCEDURES:         Renal Ultrasound (Limited) - 54008  Kidney:LEFT Length:11.1 cm Depth: 5.1 cm Cortical Width:1.4 cm Width:4.3 cm    Left Kidney/Ureter:  There is a probable lower pole, non obstructing calc noted at 2.6 mm.  Bladder:  Volume 58.2 ml      . Patient confirmed No Neulasta OnPro Device.            KUB - [id number]  A single view of the abdomen is obtained. There is an obstructing overlying bowel gas pattern. bilateral renal shadows are visualized.       . Patient confirmed No Neulasta OnPro Device.          PVR Ultrasound - [id number]  Scanned Volume: 32 cc         Urinalysis w/Scope Dipstick Dipstick Cont'd Micro  Color: Yellow Bilirubin: Neg mg/dL  WBC/hpf: 6 - 10/hpf  Appearance: Cloudy Ketones: Neg mg/dL RBC/hpf: 0 - 2/hpf  Specific Gravity: 1.015 Blood: Trace ery/uL Bacteria: Few (10-25/hpf)  pH: 7.5 Protein: Neg mg/dL Cystals: NS (Not Seen)  Glucose: Neg mg/dL Urobilinogen: 0.2 mg/dL Casts: NS (Not Seen)    Nitrites: Neg Trichomonas: Not Present    Leukocyte Esterase: 1+ leu/uL Mucous: Not Present      Epithelial Cells: NS (Not Seen)      Yeast: NS (Not Seen)      Sperm: Not Present    ASSESSMENT:      ICD-10 Details  1 GU:   Gross hematuria - O59.2 Acute, Uncomplicated  2   Renal and ureteral calculus - N20.2 Left, Undiagnosed New Problem   PLAN:            Medications New Meds: Tamsulosin Hcl 0.4 mg capsule 1 capsule PO Q HS   #20  0 Refill(s)  Cephalexin 500 mg capsule 1 capsule PO BID   #14  0 Refill(s)            Orders Labs CULTURE, URINE  X-Rays: Renal Ultrasound (Limited)    KUB          Schedule Return Visit/Planned Activity: Next  Available Appointment - Schedule Surgery          Document Letter(s):  Created for Patient: Clinical Summary         Notes:   Urinalysis is not concerning for infection, will send for precautionary culture and due to dysuria will empirically treat with cephalexin. We discussed her CT hematuria results in detail today. KUB is inconclusive due to overlying bowel gas. She would like to proceed with URS. Risks and benefits discussed. Procedure explained in detail. Prescription for tamsulsoin sent to pharmacy. She was also given a strainer and advised to strain all urine. She understands to proceed to the ED if she develops fevers, chills, nausea or vomiting. She is very concerned about bladder cancer being a heavy smoker. I did explain that this procedure involves a cystoscopy and the surgeon would be able to see the inside of the bladder and would biopsy anything that may look suspicious if necessary. She was relieved with this information. However, I do believe the source of her hematuria is related the two distal calculis and I did provide some reassurance of this information. Surgical posting sheet handed to scheduler.

## 2019-08-24 NOTE — Anesthesia Postprocedure Evaluation (Signed)
Anesthesia Post Note  Patient: Donna Bullock  Procedure(s) Performed: CYSTOSCOPY WITH DIAGNOSTIC URETEROSCOPY , REMOVAL OF STONES AND LEFT  STENT PLACEMENT (Left )     Patient location during evaluation: PACU Anesthesia Type: General Level of consciousness: awake and alert Pain management: pain level controlled Vital Signs Assessment: post-procedure vital signs reviewed and stable Respiratory status: spontaneous breathing, nonlabored ventilation, respiratory function stable and patient connected to nasal cannula oxygen Cardiovascular status: blood pressure returned to baseline and stable Postop Assessment: no apparent nausea or vomiting Anesthetic complications: no   No complications documented.  Last Vitals:  Vitals:   08/24/19 1408 08/24/19 1419  BP:  (!) 148/75  Pulse: 63 (!) 57  Resp: (!) 23 18  Temp:  37 C  SpO2: 95% 93%    Last Pain:  Vitals:   08/24/19 1419  TempSrc: Oral  PainSc: 3                  Effie Berkshire

## 2019-08-24 NOTE — Telephone Encounter (Signed)
° °  [facility] Pre-operative Risk Assessment    HEARTCARE STAFF: - Please ensure there is not already an duplicate clearance open for this procedure. - Under Visit Info/Reason for Call, type in Other and utilize the format Clearance MM/DD/YY or Clearance TBD. Do not use dashes or single digits. - If request is for dental extraction, please clarify the # of teeth to be extracted.  Request for surgical clearance:  1. What type of surgery is being performed? EGD AND COLONOSCOPY   2. When is this surgery scheduled? 08/29/19   3. What type of clearance is required (medical clearance vs. Pharmacy clearance to hold med vs. Both)? MEDICAL  4. Are there any medications that need to be held prior to surgery and how long? PLAVIX   5. Practice name and name of physician performing surgery? [facility]; DR. MANN   6. What is the office phone number? [phone]   7.   What is the office fax number? [phone]  8.   Anesthesia type (None, local, MAC, general) ? PROPOFOL   Donna Bullock 08/24/2019, 5:15 PM  _________________________________________________________________   (provider comments below)

## 2019-08-24 NOTE — Op Note (Signed)
Preoperative diagnosis:  1. Gross hematuria 2. Small left-sided nonobstructing ureteral stones  Postoperative diagnosis:  1. Same  Procedure: 1. Cystoscopy, bilateral retrograde pyelogram with interpretation 2. Left diagnostic ureteroscopy and removal of stones 3. Left ureteral stent placement  Surgeon: Ardis Hughs, MD  Anesthesia: General  Complications: None  Intraoperative findings:  #1: The right retrograde pyelogram was performed using a 5 [place] open-ended ureteral catheter and 10 cc of Omnipaque contrast.  It demonstrated a normal caliber ureter with no filling defects or abnormalities.  The calyces were sharp and there is no hydronephrosis. #2: The left retrograde pyelogram was performed using a 5 [place] open-ended ureteral catheter and 10 cc of Omnipaque contrast.  This demonstrated a normal caliber ureter with no filling defects or significant abnormalities.  There is no hydroureteronephrosis or blunting of the calyces. #3: There were no nonobstructing stones encountered in the left ureter.  There were several small stones in the midpole calyx and upper pole calyx which were completely removed.  EBL: Minimal  Specimens: Kidney stones will be taken to [facility] for stone composition analysis  Indication: Donna Bullock is a 64 y.o. patient with gross hematuria is her chief complaint.  She was noted to have small nonobstructing stones in the left distal ureter with no other significant finding.  After reviewing the management options for treatment, he elected to proceed with the above surgical procedure(s). We have discussed the potential benefits and risks of the procedure, side effects of the proposed treatment, the likelihood of the patient achieving the goals of the procedure, and any potential problems that might occur during the procedure or recuperation. Informed consent has been obtained.  Description of procedure:  The patient was taken to the  operating room and general anesthesia was induced.  The patient was placed in the dorsal lithotomy position, prepped and draped in the usual sterile fashion, and preoperative antibiotics were administered. A preoperative time-out was performed.   56 French 30 cystoscope was gently passed to the patient's urethra into the bladder under visual guidance.  360 degree cystoscopic evaluation was performed with no significant abnormalities.  A 5 French open-ended ureteral catheter was used to perform bilateral retrograde pyelograms with the above findings.  I then advanced a wire through the left ureter and up into the left renal pelvis.  Using a semirigid ureteroscope I advanced it up to the renal pelvis with no stones encountered.  I then advanced a second wire through the scope and remove the scope over the wire.  I subsequently attempted to pass the flexible scope over the wire unsuccessfully.  I then attempted to pass the inner portion of the access sheath I was only able to get just past the UVJ.  As such, I dilated the distal and mid ureter using a 15 French x4 cm balloon dilator.  Once the narrowed area was completely dilated I was able to advance the inner portion of the access sheath easily up to the renal pelvis.  I subsequently advanced the inner and outer portions over the second wire and into the proximal ureter removing the inner portion and the wire.  Using the flexible ureteroscope I performed pyeloscopy under fluoroscopic guidance and inspected all calyces with only small stones in the upper pole as well as the midpole calyx.  These were completely removed.  There were no additional stones.  I then slowly backed out the scope removing the access she simultaneously none noted significant trauma to the ureter although there  was some hyperemia from it being dilated.  I subsequently advanced a stent over the wire and into the upper pole of the left kidney under fluoroscopic guidance.  Once the stent was  noted to be well within the kidney advanced it to the urethral opening and remove the wire with a nice curl noted within the bladder.  The bladder subsequently emptied.  Stent tether was brought to the patient's urethra and tucked into her vagina.  BNO suppositories placed under rectum and she was subsequently extubated return the PACU in stable condition.  Disposition: Patient will be instructed to remove the stent on Monday, July 12.  Ardis Hughs, M.D.

## 2019-08-24 NOTE — Discharge Instructions (Signed)
DISCHARGE INSTRUCTIONS FOR KIDNEY STONE/URETERAL STENT   MEDICATIONS:  1.  Resume all your other meds from home - except do not take any extra narcotic pain meds that you may have at home.  2. Pyridium is to help with the burning/stinging when you urinate. 3. Take Cipro one hour prior to removal of your stent.   ACTIVITY:  1. No strenuous activity x 1week  2. No driving while on narcotic pain medications  3. Drink plenty of water  4. Continue to walk at home - you can still get blood clots when you are at home, so keep active, but don't over do it.  5. May return to work/school tomorrow or when you feel ready   BATHING:  1. You can shower and we recommend daily showers  2. You have a string coming from your urethra: The stent string is attached to your ureteral stent. Do not pull on this.   SIGNS/SYMPTOMS TO CALL:  Please call us if you have a fever greater than 101.5, uncontrolled nausea/vomiting, uncontrolled pain, dizziness, unable to urinate, bloody urine, chest pain, shortness of breath, leg swelling, leg pain, redness around wound, drainage from wound, or any other concerns or questions.   You can reach [place] at [phone].   FOLLOW-UP:  1. You have an appointment in 6 weeks with a ultrasound of your kidneys prior.  2. You have a string attached to your stent, you may remove it on Monday, July 12th.  To do this, pull the strings until the stents are completely removed. You may feel an odd sensation in your back.

## 2019-08-24 NOTE — Anesthesia Procedure Notes (Signed)
Procedure Name: LMA Insertion Date/Time: 08/24/2019 12:28 PM Performed by: Eben Burow, CRNA Pre-anesthesia Checklist: Patient identified, Emergency Drugs available, Suction available, Patient being monitored and Timeout performed Patient Re-evaluated:Patient Re-evaluated prior to induction Oxygen Delivery Method: Circle system utilized Preoxygenation: Pre-oxygenation with 100% oxygen Induction Type: IV induction Ventilation: Mask ventilation without difficulty LMA: LMA inserted LMA Size: 4.0 Number of attempts: 1 Tube secured with: Tape Dental Injury: Teeth and Oropharynx as per pre-operative assessment

## 2019-08-25 ENCOUNTER — Encounter (HOSPITAL_COMMUNITY): Payer: Self-pay | Admitting: Urology

## 2019-08-25 NOTE — Telephone Encounter (Signed)
° °  Primary Cardiologist: Lauree Chandler, MD  Chart reviewed as part of pre-operative protocol coverage. Patient was contacted 08/25/2019 in reference to pre-operative risk assessment for pending surgery as outlined below.  ANNARAE MACNAIR was last seen on 08/03/2019 by Truitt Merle NP.  Since that day, ALYSSAH ALGEO has done well without chest pain or worsening dyspnea.  Therefore, based on ACC/AHA guidelines, the patient would be at acceptable risk for the planned procedure without further cardiovascular testing.   I will route this recommendation to the requesting party via Epic fax function and remove from pre-op pool. Please call with questions.  Note, patient was cleared for this same procedure in October 2020, however procedure was delayed. She may hold Plavix for 5 days as previous recommended by Dr. [name] during the last clearance. Per patient, she had to come off of plavix for 5 days for a Cystoscopy procedure yesterday and was restarted on the plavix this morning. She is hesitant to hold it again for another 5 days and wish to reschedule the colonoscopy at a later time. I informed her that this cardiac clearance will be good for the next 2 month. Once she gets the date of her colonoscopy, she may hold plavix for 5 days before the procedure and restart it as soon as possible after the surgery at the surgeon's discretion.    [place], [place] 08/25/2019, 5:53 PM

## 2019-12-16 ENCOUNTER — Telehealth: Payer: Self-pay | Admitting: *Deleted

## 2019-12-16 NOTE — Telephone Encounter (Signed)
S/w pt is agreeable to be seen in [place].

## 2020-01-18 NOTE — Progress Notes (Signed)
CARDIOLOGY OFFICE NOTE  Date:  02/01/2020    Donna Bullock Date of Birth: [birth year] Medical Record #[id number]  PCP:  Jani Gravel, MD  Cardiologist:  Servando Snare   Chief Complaint  Patient presents with  . Follow-up    History of Present Illness: Donna Bullock is a 64 y.o. female who presents today for a follow up visit. Seen for Dr. [name]. She is a former patient of Dr. Susa Simmonds that I previously cared for. She primarily follows with me.    She has a history of CAD, HLD, HTN, and tobacco abuse.  Her CAD dates back to 1996 when she had a stent placed in her RCA. 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 Cath showed a 75% distal LAD narrowing and this was managed conservatively with relook cath two days later showing 50% distal stenosis. She then had an inferior MI in 2003 with placement of overlapping Cypher drug eluting stents in the RCA. The LAD and Circumflex had mild disease per report. Four days later, she had severe chest pain, cath showed severe spasm of the LAD and Circumflex which resolved with IC vasodilators. Her last cath was in 2007 and showed patency of stents in the RCA and no other obstructive disease in the LAD and Circumflex. Remote stress test was in November 2009 and showed normal LVEF with no evidence of ischemia. She was seen in the ED 09/02/13 with bright red blood per rectum. Her ASA and Plavix were held. Colonoscopy 09/09/13 per Dr. Collene Mares with polyps removed but no other bleeding source identified. Aspirin was stopped but she has continued to use Excedrin Migraine.    Her other issues include RA, HLD, HTN and past tobacco abuse along with depression/anxiety.   I had not seen her in 5 1/2 years until August of 2016. She had since gotten married - to [employer] who I also see. She had stopped smoking. Some vague symptoms noted - did not want to have stress testing but then needed shoulder surgery and I got her Myoview updated - no ischemia  but EF a little down - better by echo from 2018 - she has been managed medically since. She has seen Dr. Lovena Le for palpitations. She ended up having repeat cath back in 10/2017 (last study had been 12 years prior) due to chest pain - this was reassuring. Her prior anginal equivalent has never been typical - she presented once with a VF arrest. Her vasospasm was more of a choking sensation and she has SCAD twice.    Last seen in June - she was doing well but had had a fall with a wrist fracture. Weight was down. No chest pain.   Comes in today. Here alone. She is doing well. No chest pain. She has had some kidney stones. She has slacked off some with her walking. BP is good. Not dizzy. She feels like she is doing ok.    Past Medical History:  Diagnosis Date  . Arthritis    rheumatoid arthritis, and osteoarthritis   . Benign tumor of adrenal gland   . CAD (coronary artery disease)    Inferior MI 1996 tx with bare metal stent RCA. Repeat  anterior MI 2002 with LAD vasospasm. Repeat inferior MI 2003 with occlusion RCA with overlapping Cypher DES in RCA. Repeat cath 2003 with profound vasospasm LAd and Circumflex. Repeat cath 2007  with patent RCA and no disease in LAD or Circumflex.  Marland Kitchen  Depression   . Environmental allergies   . Family history of adverse reaction to anesthesia    mother had ? problem with anesthesia - was alcoholic - "mind was never right again"  . Family history of breast cancer   . Family history of leukemia   . Family history of stomach cancer   . Fibromyalgia   . GERD (gastroesophageal reflux disease)   . Heart murmur   . History of kidney stones   . History of stomach ulcers    due to taking aspirin for headaches  . HTN (hypertension)   . Hyperlipidemia   . Myocardial infarction ([place])    times 4 "very minor" stents x2   . Osteopenia   . Osteoporosis   . Rotator cuff tear    left  . Sleep apnea    uses c pap - does not know settings   . Urinary incontinence      Past Surgical History:  Procedure Laterality Date  . ABDOMINAL HYSTERECTOMY    . BACK SURGERY    . CYSTOSCOPY WITH URETEROSCOPY AND STENT PLACEMENT Left 08/24/2019   Procedure: CYSTOSCOPY WITH DIAGNOSTIC URETEROSCOPY , REMOVAL OF STONES AND LEFT  STENT PLACEMENT;  Surgeon: Ardis Hughs, MD;  Location: [facility];  Service: Urology;  Laterality: Left;  . LAPAROSCOPIC LYSIS OF ADHESIONS    . LEFT HEART CATH AND CORONARY ANGIOGRAPHY N/A 11/16/2017   Procedure: LEFT HEART CATH AND CORONARY ANGIOGRAPHY;  Surgeon: Belva Crome, MD;  Location: [facility];  Service: Cardiovascular;  Laterality: N/A;  . MYOMECTOMY     age 59  . PELVIC ABCESS DRAINAGE    . SHOULDER OPEN ROTATOR CUFF REPAIR Left 11/16/2014   Procedure: LEFT MINI OPEN ROTATOR CUFF REPAIR SHOULDER OPEN WITH SUBACROMIAL DECOMPRESSION;  Surgeon: Susa Day, MD;  Location: [facility];  Service: Orthopedics;  Laterality: Left;     Medications: Current Meds  Medication Sig  . Acetaminophen-Caffeine (EXCEDRIN ASPIRIN FREE PO) Take 2 tablets by mouth daily as needed (headaches).   . Artificial Tear Solution (SOOTHE XP) SOLN Place 1 drop into both eyes daily as needed (dry eyes).  Marland Kitchen b complex vitamins tablet Take 1 tablet by mouth daily.  . Calcium Citrate-Vitamin D (CITRACAL + D PO) Take 1 tablet by mouth 2 (two) times daily.  . clopidogrel (PLAVIX) 75 MG tablet TAKE 1 TABLET BY MOUTH  DAILY (Patient taking differently: Take 75 mg by mouth daily.)  . diltiazem (CARDIZEM CD) 120 MG 24 hr capsule TAKE 1 CAPSULE BY MOUTH  DAILY (Patient taking differently: Take 120 mg by mouth daily.)  . diphenhydrAMINE (BENADRYL) 25 MG tablet Take 25 mg by mouth daily as needed for allergies.   Marland Kitchen escitalopram (LEXAPRO) 10 MG tablet TAKE 1 TABLET BY MOUTH  DAILY (Patient taking differently: Take 10 mg by mouth daily.)  . fluticasone (FLONASE) 50 MCG/ACT nasal spray Place 2 sprays into both nostrils at bedtime.  . isosorbide mononitrate (IMDUR) 30  MG 24 hr tablet TAKE ONE-HALF TABLET BY  MOUTH AT BEDTIME (Patient taking differently: Take 15 mg by mouth at bedtime.)  . lisinopril (ZESTRIL) 20 MG tablet TAKE 1 TABLET BY MOUTH  DAILY (Patient taking differently: Take 20 mg by mouth at bedtime.)  . metoprolol succinate (TOPROL-XL) 25 MG 24 hr tablet TAKE 1 TABLET BY MOUTH  DAILY (Patient taking differently: Take 25 mg by mouth at bedtime.)  . montelukast (SINGULAIR) 10 MG tablet Take 10 mg by mouth at bedtime.  . Multiple  Vitamins-Minerals (CENTRUM SILVER PO) Take 1 tablet by mouth daily.   . Multiple Vitamins-Minerals (PRESERVISION AREDS 2) CAPS Take 1 capsule by mouth 2 (two) times daily.  Marland Kitchen omeprazole (PRILOSEC) 20 MG capsule Take 1 capsule (20 mg total) by mouth daily.  . Ranibizumab (LUCENTIS IO) Inject into the eye as directed. Eye injections every 8 - 10 weeks - unsure of dose  . simvastatin (ZOCOR) 40 MG tablet TAKE 1 TABLET BY MOUTH AT  BEDTIME (Patient taking differently: Take 40 mg by mouth at bedtime.)  . valACYclovir (VALTREX) 500 MG tablet TAKE 1 TABLET BY MOUTH TWICE A DAY FOR 3 DAYS AS NEEDED     Allergies: Allergies  Allergen Reactions  . Other Hives and Diarrhea    Peppers   . Tomato Diarrhea  . Valium Other (See Comments)    Altered mental state    Social History: The patient  reports that she has been smoking cigarettes. She has been smoking about 0.50 packs per day. She has never used smokeless tobacco. She reports that she does not drink alcohol and does not use drugs.   Family History: The patient's family history includes Alcohol abuse in her father and mother; Breast cancer (age of onset: 32) in her maternal grandmother; Breast cancer (age of onset: 72) in her cousin; Breast cancer (age of onset: 4) in her maternal aunt; Breast cancer (age of onset: 36) in her mother; Breast cancer (age of onset: 39) in her maternal aunt; Cancer (age of onset: 13) in her cousin; Cancer - Lung in her father; Leukemia (age of  onset: 40) in her sister; Lung cancer in her father; Stomach cancer (age of onset: 66) in her paternal aunt.   Review of Systems: Please see the history of present illness.   All other systems are reviewed and negative.   Physical Exam: VS:  BP 126/82   Pulse (!) 56   Ht 5\' 7"  (1.702 m)   Wt 163 lb 12.8 oz (74.3 kg)   LMP 02/18/1995   SpO2 95%   BMI 25.65 kg/m  .  BMI Body mass index is 25.65 kg/m.  Wt Readings from Last 3 Encounters:  02/01/20 163 lb 12.8 oz (74.3 kg)  08/24/19 156 lb (70.8 kg)  08/03/19 159 lb 1.9 oz (72.2 kg)    General: Pleasant. Alert and in no acute distress.  Weight is up a few pounds.  HEENT: Normal.  Neck: Supple, no JVD, carotid bruits, or masses noted.  Cardiac: Regular rate and rhythm. Soft murmur. No edema.  Respiratory:  Lungs are clear to auscultation bilaterally with normal work of breathing.  GI: Soft and nontender.  MS: No deformity or atrophy. Gait and ROM intact.  Skin: Warm and dry. Color is normal.  Neuro:  Strength and sensation are intact and no gross focal deficits noted.  Psych: Alert, appropriate and with normal affect.   LABORATORY DATA:  EKG:  EKG is ordered today.  Personally reviewed by me. This demonstrates sinus bradycardia - HR is 56 - LVH - diffuse T wave changes.  Lab Results  Component Value Date   WBC 7.3 08/24/2019   HGB 14.7 08/24/2019   HCT 44.8 08/24/2019   PLT 235 08/24/2019   GLUCOSE 107 (H) 08/24/2019   CHOL 138 08/03/2019   TRIG 122 08/03/2019   HDL 61 08/03/2019   LDLCALC 56 08/03/2019   ALT 17 08/03/2019   AST 24 08/03/2019   NA 141 08/24/2019   K 3.7 08/24/2019  CL 108 08/24/2019   CREATININE 0.74 08/24/2019   BUN 13 08/24/2019   CO2 24 08/24/2019   TSH 1.190 03/09/2017   INR 0.90 09/02/2013     BNP (last 3 results) No results for input(s): BNP in the last 8760 hours.  ProBNP (last 3 results) No results for input(s): PROBNP in the last 8760 hours.   Other Studies Reviewed  Today:  LEFT HEART CATH AND CORONARY ANGIOGRAPHY 11/16/2017  Conclusion     Review of prior images demonstrates distal LAD SCAD in December 2002 that healed spontaneously.  10 months later, October 2003, SCAD in distal RCA treated with stenting.  Left main is normal  LAD is large and wraps around the apex.  LAD is normal and previous site of SCAD 15 years ago is normal.  Circumflex coronary artery is normal.  RCA is widely patent.  Segmental 30% proximal to mid atherosclerotic disease.  Mid to distal RCA stents are widely patent.  The stents are in the distribution of previous SCAD October 2003.  Inferobasal akinesis.  EF 45 to 50%.  Normal LVEDP.   RECOMMENDATIONS:    Continue aggressive primary prevention/risk factor modification: Monitor glycemic control, blood pressure less than 130/80 mmHg, LDL less than 70, smoking cessation, and at least 150 minutes of moderate aerobic activity per week  Current symptoms not felt to represent ischemia/obstructive CAD.   Recommend Aspirin 81mg  daily for moderate CAD.    Echo Study Conclusions 09/2016   - Left ventricle: The cavity size was mildly dilated. Systolic   function was normal. The estimated ejection fraction was in the   range of 55% to 60%. Wall motion was normal; there were no   regional wall motion abnormalities. Features are consistent with   a pseudonormal left ventricular filling pattern, with concomitant   abnormal relaxation and increased filling pressure (grade 2   diastolic dysfunction). Doppler parameters are consistent with   high ventricular filling pressure. - Aortic valve: There was mild regurgitation. - Mitral valve: There was trivial regurgitation.     48 HR Holter Study Highlights 07/2016   Sinus rhythm with sinus bradycardia. Lowest heart rate 49 bpm at 4am.  Frequent premature ventricular contractions (760 during monitoring period) with several episodes of bigeminy.  Rare premature atrial contractions       ASSESSMENT & PLAN:     1. CAD - remote MI, prior SCAD and vasospasm - prior remote PCi to dRCA - last cath from 10/2017 reassuring and she has been managed medically. She will continue with her current regimen - needs to get back to CV risk factor modification.   2. Obesity - trying to get back on track.   3. HTN - BP is ok - no changes made today.   4. HLD - on statin - lab today.   5. Former smoker  6. Prior mild LV dysfunction - last echo with normal EF - she is on ACE and beta blocker which we will continue.   7. Mild valvular heart disease - no symptoms - would follow.    Current medicines are reviewed with the patient today.  The patient does not have concerns regarding medicines other than what has been noted above.  The following changes have been made:  See above.  Labs/ tests ordered today include:    Orders Placed This Encounter  Procedures  . Basic metabolic panel  . CBC  . Hepatic function panel  . Lipid panel     Disposition:  FU with Dr. Johney Frame in 4 months as a new patient per her request  - she has not see Dr. [name] in several years. Labs today. No change with current regimen.    Patient is agreeable to this plan and will call if any problems develop in the interim.   SignedTruitt Merle, NP  02/01/2020 8:53 AM  [facility] [address] [place], [place]  [address] Phone: [phone] Fax: [phone]

## 2020-02-01 ENCOUNTER — Encounter: Payer: Self-pay | Admitting: Nurse Practitioner

## 2020-02-01 ENCOUNTER — Other Ambulatory Visit: Payer: Self-pay

## 2020-02-01 ENCOUNTER — Ambulatory Visit: Payer: Medicare Other | Admitting: Nurse Practitioner

## 2020-02-01 ENCOUNTER — Ambulatory Visit (INDEPENDENT_AMBULATORY_CARE_PROVIDER_SITE_OTHER): Payer: Medicare Other | Admitting: Nurse Practitioner

## 2020-02-01 VITALS — BP 126/82 | HR 56 | Ht 67.0 in | Wt 163.8 lb

## 2020-02-01 DIAGNOSIS — I259 Chronic ischemic heart disease, unspecified: Secondary | ICD-10-CM | POA: Diagnosis not present

## 2020-02-01 DIAGNOSIS — E785 Hyperlipidemia, unspecified: Secondary | ICD-10-CM | POA: Diagnosis not present

## 2020-02-01 DIAGNOSIS — I10 Essential (primary) hypertension: Secondary | ICD-10-CM | POA: Diagnosis not present

## 2020-02-01 NOTE — Patient Instructions (Addendum)
After Visit Summary:  We will be checking the following labs today - BMET, CBC, HPF and Lipids   Medication Instructions:    Continue with your current medicines.    If you need a refill on your cardiac medications before your next appointment, please call your pharmacy.     Testing/Procedures To Be Arranged:  N/A  Follow-Up:   See Dr. Gwyndolyn Kaufman as a new patient in 4 months    At [facility], you and your health needs are our priority.  As part of our continuing mission to provide you with exceptional heart care, we have created designated Provider Care Teams.  These Care Teams include your primary Cardiologist (physician) and Advanced Practice Providers (APPs -  Physician Assistants and Nurse Practitioners) who all work together to provide you with the care you need, when you need it.  Special Instructions:  . Stay safe, wash your hands for at least 20 seconds and wear a mask when needed.  . It was good to talk with you today.    Call the [place] office at [phone] if you have any questions, problems or concerns.

## 2020-02-01 NOTE — Addendum Note (Signed)
Addended by: Mady Haagensen on: 02/01/2020 02:51 PM   Modules accepted: Orders

## 2020-02-02 LAB — CBC
Hematocrit: 44.9 % (ref 34.0–46.6)
Hemoglobin: 14.9 g/dL (ref 11.1–15.9)
MCH: 30.3 pg (ref 26.6–33.0)
MCHC: 33.2 g/dL (ref 31.5–35.7)
MCV: 91 fL (ref 79–97)
Platelets: 240 10*3/uL (ref 150–450)
RBC: 4.91 x10E6/uL (ref 3.77–5.28)
RDW: 12.9 % (ref 11.7–15.4)
WBC: 6.5 10*3/uL (ref 3.4–10.8)

## 2020-02-02 LAB — BASIC METABOLIC PANEL
BUN/Creatinine Ratio: 24 (ref 12–28)
BUN: 17 mg/dL (ref 8–27)
CO2: 23 mmol/L (ref 20–29)
Calcium: 9.8 mg/dL (ref 8.7–10.3)
Chloride: 104 mmol/L (ref 96–106)
Creatinine, Ser: 0.71 mg/dL (ref 0.57–1.00)
GFR calc Af Amer: 103 mL/min/{1.73_m2} (ref >59)
GFR calc non Af Amer: 89 mL/min/{1.73_m2} (ref >59)
Glucose: 112 mg/dL — ABNORMAL HIGH (ref 65–99)
Potassium: 4.5 mmol/L (ref 3.5–5.2)
Sodium: 142 mmol/L (ref 134–144)

## 2020-02-02 LAB — HEPATIC FUNCTION PANEL
ALT: 18 IU/L (ref 0–32)
AST: 25 IU/L (ref 0–40)
Albumin: 4.4 g/dL (ref 3.8–4.8)
Alkaline Phosphatase: 102 IU/L (ref 44–121)
Bilirubin Total: 0.4 mg/dL (ref 0.0–1.2)
Bilirubin, Direct: 0.11 mg/dL (ref 0.00–0.40)
Total Protein: 6.5 g/dL (ref 6.0–8.5)

## 2020-02-02 LAB — LIPID PANEL
Chol/HDL Ratio: 2.9 ratio (ref 0.0–4.4)
Cholesterol, Total: 167 mg/dL (ref 100–199)
HDL: 58 mg/dL (ref >39)
LDL Chol Calc (NIH): 78 mg/dL (ref 0–99)
Triglycerides: 187 mg/dL — ABNORMAL HIGH (ref 0–149)
VLDL Cholesterol Cal: 31 mg/dL (ref 5–40)

## 2020-05-21 NOTE — Progress Notes (Deleted)
Cardiology Office Note:    Date:  05/21/2020   ID:  Donna Bullock, DOB May 20, 1955, MRN [id number]  PCP:  Jani Gravel, MD   [facility]  Cardiologist:  Lauree Chandler, MD *** Advanced Practice Provider:  No care team member to display Electrophysiologist:  None    Referring MD: Jani Gravel, MD    History of Present Illness:    Donna Bullock is a 65 y.o. female with a hx of CAD, HLD, HTN, RA, and tobacco abuse who was previously followed by Glean Hess now presenting to clinic for follow-up.  Her CAD dates back to 1996 when she had a stent placed in her RCA. She had an anterior MI with cardiac arrest (VFib) in 2002. Cath showed a 75% distal LAD narrowing and this was managed conservatively with relook cath two days later showing 50% distal stenosis. She then had an inferior MI in 2003 with placement of overlapping Cypher drug eluting stents in the RCA. The LAD and Circumflex had mild disease per report. Four days later, she had severe chest pain, cath showed severe spasm of the LAD and Circumflex which resolved with IC vasodilators. Cath 2007 and showed patency of stents in the RCA and no other obstructive disease in the LAD and Circumflex.Remotestress test was in November 2009 and showed normal LVEF with no evidence of ischemia. She was seen in the ED 09/02/13 with bright red blood per rectum. Her ASA and Plavix were held. Colonoscopy 09/09/13 per Dr. Collene Mares with polyps removed but no other bleeding source identified. Aspirin wasstopped.  She was lost to follow-up until August of 2016 where she saw Margarita Grizzle. Underwent myoview with no ischemia but EF depressed. Had repeat cath    Past Medical History:  Diagnosis Date  . Arthritis    rheumatoid arthritis, and osteoarthritis   . Benign tumor of adrenal gland   . CAD (coronary artery disease)    Inferior MI 1996 tx with bare metal stent RCA. Repeat  anterior MI 2002 with LAD vasospasm. Repeat inferior MI  2003 with occlusion RCA with overlapping Cypher DES in RCA. Repeat cath 2003 with profound vasospasm LAd and Circumflex. Repeat cath 2007  with patent RCA and no disease in LAD or Circumflex.  . Depression   . Environmental allergies   . Family history of adverse reaction to anesthesia    mother had ? problem with anesthesia - was alcoholic - "mind was never right again"  . Family history of breast cancer   . Family history of leukemia   . Family history of stomach cancer   . Fibromyalgia   . GERD (gastroesophageal reflux disease)   . Heart murmur   . History of kidney stones   . History of stomach ulcers    due to taking aspirin for headaches  . HTN (hypertension)   . Hyperlipidemia   . Myocardial infarction ([place])    times 4 "very minor" stents x2   . Osteopenia   . Osteoporosis   . Rotator cuff tear    left  . Sleep apnea    uses c pap - does not know settings   . Urinary incontinence     Past Surgical History:  Procedure Laterality Date  . ABDOMINAL HYSTERECTOMY    . BACK SURGERY    . CYSTOSCOPY WITH URETEROSCOPY AND STENT PLACEMENT Left 08/24/2019   Procedure: CYSTOSCOPY WITH DIAGNOSTIC URETEROSCOPY , REMOVAL OF STONES AND LEFT  STENT PLACEMENT;  Surgeon: Ardis Hughs, MD;  Location: [facility];  Service: Urology;  Laterality: Left;  . LAPAROSCOPIC LYSIS OF ADHESIONS    . LEFT HEART CATH AND CORONARY ANGIOGRAPHY N/A 11/16/2017   Procedure: LEFT HEART CATH AND CORONARY ANGIOGRAPHY;  Surgeon: Belva Crome, MD;  Location: [facility];  Service: Cardiovascular;  Laterality: N/A;  . MYOMECTOMY     age 73  . PELVIC ABCESS DRAINAGE    . SHOULDER OPEN ROTATOR CUFF REPAIR Left 11/16/2014   Procedure: LEFT MINI OPEN ROTATOR CUFF REPAIR SHOULDER OPEN WITH SUBACROMIAL DECOMPRESSION;  Surgeon: Susa Day, MD;  Location: [facility];  Service: Orthopedics;  Laterality: Left;    Current Medications: No outpatient medications have been marked as taking for the 05/28/20 encounter  (Appointment) with Freada Bergeron, MD.     Allergies:   Other, Tomato, and Valium   Social History   Socioeconomic History  . Marital status: Married    Spouse name: Not on file  . Number of children: 0  . Years of education: 53  . Highest education level: Not on file  Occupational History    Employer: [employer]  Tobacco Use  . Smoking status: Current Some Day Smoker    Packs/day: 0.50    Types: Cigarettes  . Smokeless tobacco: Never Used  . Tobacco comment: currently using e-cigs  Vaping Use  . Vaping Use: Former  Substance and Sexual Activity  . Alcohol use: No  . Drug use: No  . Sexual activity: Not Currently    Partners: Male    Birth control/protection: Surgical    Comment: TAH  Other Topics Concern  . Not on file  Social History Narrative  . Not on file   Social Determinants of Health   Financial Resource Strain: Not on file  Food Insecurity: Not on file  Transportation Needs: Not on file  Physical Activity: Not on file  Stress: Not on file  Social Connections: Not on file     Family History: The patient's ***family history includes Alcohol abuse in her father and mother; Breast cancer (age of onset: 90) in her maternal grandmother; Breast cancer (age of onset: 46) in her cousin; Breast cancer (age of onset: 64) in her maternal aunt; Breast cancer (age of onset: 73) in her mother; Breast cancer (age of onset: 61) in her maternal aunt; Cancer (age of onset: 61) in her cousin; Cancer - Lung in her father; Leukemia (age of onset: 42) in her sister; Lung cancer in her father; Stomach cancer (age of onset: 64) in her paternal aunt.  ROS:   Please see the history of present illness.    *** All other systems reviewed and are negative.  EKGs/Labs/Other Studies Reviewed:    The following studies were reviewed today: Cath 11/16/17:  Review of prior images demonstrates distal LAD SCAD in December 2002 that healed spontaneously.  10 months later, October  2003, SCAD in distal RCA treated with stenting.  Left main is normal  LAD is large and wraps around the apex.  LAD is normal and previous site of SCAD 15 years ago is normal.  Circumflex coronary artery is normal.  RCA is widely patent.  Segmental 30% proximal to mid atherosclerotic disease.  Mid to distal RCA stents are widely patent.  The stents are in the distribution of previous SCAD October 2003.  Inferobasal akinesis.  EF 45 to 50%.  Normal LVEDP.  RECOMMENDATIONS:   Continue aggressive primary prevention/risk factor modification: Monitor glycemic control, blood pressure less than 130/80 mmHg,  LDL less than 70, smoking cessation, and at least 150 minutes of moderate aerobic activity per week  Current symptoms not felt to represent ischemia/obstructive CAD.  TTE 2018: Study Conclusions   - Left ventricle: The cavity size was mildly dilated. Systolic  function was normal. The estimated ejection fraction was in the  range of 55% to 60%. Wall motion was normal; there were no  regional wall motion abnormalities. Features are consistent with  a pseudonormal left ventricular filling pattern, with concomitant  abnormal relaxation and increased filling pressure (grade 2  diastolic dysfunction). Doppler parameters are consistent with  high ventricular filling pressure.  - Aortic valve: There was mild regurgitation.  - Mitral valve: There was trivial regurgitation.  Myoview 09/25/16: Normal exercise treadmill stress test, no ischemia. Normal exercise capacity. Normal BP response to stress.    EKG:  EKG is *** ordered today.  The ekg ordered today demonstrates ***  Recent Labs: 02/01/2020: ALT 18; BUN 17; Creatinine, Ser 0.71; Hemoglobin 14.9; Platelets 240; Potassium 4.5; Sodium 142  Recent Lipid Panel    Component Value Date/Time   CHOL 167 02/01/2020 0857   TRIG 187 (H) 02/01/2020 0857   HDL 58 02/01/2020 0857   CHOLHDL 2.9 02/01/2020 0857   CHOLHDL 3.5  01/30/2016 1128   VLDL 71 (H) 01/30/2016 1128   LDLCALC 78 02/01/2020 0857     Risk Assessment/Calculations:   {Does this patient have ATRIAL FIBRILLATION?:[phone]}   Physical Exam:    VS:  LMP 02/18/1995     Wt Readings from Last 3 Encounters:  02/01/20 163 lb 12.8 oz (74.3 kg)  08/24/19 156 lb (70.8 kg)  08/03/19 159 lb 1.9 oz (72.2 kg)     GEN: *** Well nourished, well developed in no acute distress HEENT: Normal NECK: No JVD; No carotid bruits LYMPHATICS: No lymphadenopathy CARDIAC: ***RRR, no murmurs, rubs, gallops RESPIRATORY:  Clear to auscultation without rales, wheezing or rhonchi  ABDOMEN: Soft, non-tender, non-distended MUSCULOSKELETAL:  No edema; No deformity  SKIN: Warm and dry NEUROLOGIC:  Alert and oriented x 3 PSYCHIATRIC:  Normal affect   ASSESSMENT:    No diagnosis found. PLAN:    In order of problems listed above:  #CAD: Remote history of MI with PCI to dRCA, prior SCAD and vasospasm.  -Continue plavix75mg  daily -Continue imdur 30mg  daily -Continue lisinopril 20mg  daily -Continue metop 25mg  XL daily -Continue simvastatin 40mg  daily  #HTN: -Continue imdur 30mg  daily -Continue lisinopril 20mg  daily -Continue metop 25mg  XL daily  #HLD: -Continue simvastatin 40mg  daily   #Mild AR: #Mild TR: -Monitor serially  {Are you ordering a CV Procedure (e.g. stress test, cath, DCCV, TEE, etc)?   Press F2        :[id number]}    Medication Adjustments/Labs and Tests Ordered: Current medicines are reviewed at length with the patient today.  Concerns regarding medicines are outlined above.  No orders of the defined types were placed in this encounter.  No orders of the defined types were placed in this encounter.   There are no Patient Instructions on file for this visit.   Signed, Freada Bergeron, MD  05/21/2020 7:16 PM    [place]

## 2020-05-28 ENCOUNTER — Ambulatory Visit (INDEPENDENT_AMBULATORY_CARE_PROVIDER_SITE_OTHER): Payer: Medicare Other | Admitting: Cardiology

## 2020-05-28 ENCOUNTER — Other Ambulatory Visit: Payer: Self-pay

## 2020-05-28 ENCOUNTER — Encounter: Payer: Self-pay | Admitting: Cardiology

## 2020-05-28 VITALS — BP 124/74 | HR 64 | Ht 67.0 in | Wt 167.2 lb

## 2020-05-28 DIAGNOSIS — I201 Angina pectoris with documented spasm: Secondary | ICD-10-CM

## 2020-05-28 DIAGNOSIS — I2510 Atherosclerotic heart disease of native coronary artery without angina pectoris: Secondary | ICD-10-CM | POA: Diagnosis not present

## 2020-05-28 DIAGNOSIS — I2542 Coronary artery dissection: Secondary | ICD-10-CM

## 2020-05-28 DIAGNOSIS — E785 Hyperlipidemia, unspecified: Secondary | ICD-10-CM | POA: Diagnosis not present

## 2020-05-28 DIAGNOSIS — I10 Essential (primary) hypertension: Secondary | ICD-10-CM

## 2020-05-28 DIAGNOSIS — R0609 Other forms of dyspnea: Secondary | ICD-10-CM

## 2020-05-28 DIAGNOSIS — R0600 Dyspnea, unspecified: Secondary | ICD-10-CM

## 2020-05-28 MED ORDER — ROSUVASTATIN CALCIUM 10 MG PO TABS
10.0000 mg | ORAL_TABLET | Freq: Every day | ORAL | 1 refills | Status: DC
Start: 2020-05-28 — End: 2020-09-21

## 2020-05-28 NOTE — Patient Instructions (Signed)
Medication Instructions:   STOP TAKING SIMVASTATIN NOW  START TAKING ROSUVASTATIN (CRESTOR) 10 MG BY MOUTH DAILY  *If you need a refill on your cardiac medications before your next appointment, please call your pharmacy*    Follow-Up: At [facility], you and your health needs are our priority.  As part of our continuing mission to provide you with exceptional heart care, we have created designated Provider Care Teams.  These Care Teams include your primary Cardiologist (physician) and Advanced Practice Providers (APPs -  Physician Assistants and Nurse Practitioners) who all work together to provide you with the care you need, when you need it.  We recommend signing up for the patient portal called "MyChart".  Sign up information is provided on this After Visit Summary.  MyChart is used to connect with patients for Virtual Visits (Telemedicine).  Patients are able to view lab/test results, encounter notes, upcoming appointments, etc.  Non-urgent messages can be sent to your provider as well.   To learn more about what you can do with MyChart, go to [email or web address].    Your next appointment:   6 month(s)  The format for your next appointment:   In Person  Provider:   You will see one of the following Advanced Practice Providers on your designated Care Team:    Richardson Dopp, PA-C  Vin Bhagat, PA-C    Other Instructions  PLEASE [place]

## 2020-05-28 NOTE — Progress Notes (Signed)
Cardiology Office Note:    Date:  05/28/2020   ID:  Donna Bullock, DOB Aug 06, 1955, MRN [id number]  PCP:  Jani Gravel, MD   [facility]  Cardiologist:  Lauree Chandler, MD  Advanced Practice Provider:  No care team member to display Electrophysiologist:  None    Referring MD: Jani Gravel, MD    History of Present Illness:    Donna Bullock is a 65 y.o. female with a hx of CAD, HLD, HTN, RA, and tobacco abuse who was previously followed by Glean Hess now presenting to clinic for follow-up.  Her CAD dates back to 1996 when she had a stent placed in her RCA. She had an anterior MI with cardiac arrest (VFib) in 2002. Cath showed a 75% distal LAD narrowing and this was managed conservatively with relook cath two days later showing 50% distal stenosis. She then had an inferior MI in 2003 with placement of overlapping Cypher drug eluting stents in the RCA. The LAD and Circumflex had mild disease per report. Four days later, she had severe chest pain, cath showed severe spasm of the LAD and Circumflex which resolved with IC vasodilators. Cath 2007 and showed patency of stents in the RCA and no other obstructive disease in the LAD and Circumflex.Remotestress test was in November 2009 and showed normal LVEF with no evidence of ischemia. She was seen in the ED 09/02/13 with bright red blood per rectum. Her ASA and Plavix were held. Colonoscopy 09/09/13 per Dr. Collene Mares with polyps removed but no other bleeding source identified. Aspirin wasstopped.  She was lost to follow-up until August of 2016 where she saw Margarita Grizzle. Underwent myoview with no ischemia but EF depressed. Had repeat cath which showed Distal LAD SCAD healed, no LM disease, LAD is large without significant disease. RCA 30% proximal to mid atherosclerotic disease. Mid to distal RCA stents patent. LV gram with inferobasal akinesis. Was recommened for continued medical management. She was last seen by Cecille Rubin  02/01/20 she was doing well with no significant symptoms.   Today, the patient states that she is doing "fine." She has chronic SOB with exertion which is unchanged. Usually only occurs when walking up the hills surrounding her house. No symptoms when walking on flat ground. Has intermittent, brief episodes of chest pressure which resolve without intervention. Has not been using nitro. Blood pressure controlled. Has chronic diffuse myalgias and joint pain which she thinks is related to her RA. Notably is on simva but her body aches are chronic. She has no LE edema, PND, or orthopnea. She has no lightheadedness or dizziness.    Past Medical History:  Diagnosis Date  . Arthritis    rheumatoid arthritis, and osteoarthritis   . Benign tumor of adrenal gland   . CAD (coronary artery disease)    Inferior MI 1996 tx with bare metal stent RCA. Repeat  anterior MI 2002 with LAD vasospasm. Repeat inferior MI 2003 with occlusion RCA with overlapping Cypher DES in RCA. Repeat cath 2003 with profound vasospasm LAd and Circumflex. Repeat cath 2007  with patent RCA and no disease in LAD or Circumflex.  . Depression   . Environmental allergies   . Family history of adverse reaction to anesthesia    mother had ? problem with anesthesia - was alcoholic - "mind was never right again"  . Family history of breast cancer   . Family history of leukemia   . Family history of stomach cancer   . Fibromyalgia   .  GERD (gastroesophageal reflux disease)   . Heart murmur   . History of kidney stones   . History of stomach ulcers    due to taking aspirin for headaches  . HTN (hypertension)   . Hyperlipidemia   . Myocardial infarction ([place])    times 4 "very minor" stents x2   . Osteopenia   . Osteoporosis   . Rotator cuff tear    left  . Sleep apnea    uses c pap - does not know settings   . Urinary incontinence     Past Surgical History:  Procedure Laterality Date  . ABDOMINAL HYSTERECTOMY    . BACK  SURGERY    . CYSTOSCOPY WITH URETEROSCOPY AND STENT PLACEMENT Left 08/24/2019   Procedure: CYSTOSCOPY WITH DIAGNOSTIC URETEROSCOPY , REMOVAL OF STONES AND LEFT  STENT PLACEMENT;  Surgeon: Ardis Hughs, MD;  Location: [facility];  Service: Urology;  Laterality: Left;  . LAPAROSCOPIC LYSIS OF ADHESIONS    . LEFT HEART CATH AND CORONARY ANGIOGRAPHY N/A 11/16/2017   Procedure: LEFT HEART CATH AND CORONARY ANGIOGRAPHY;  Surgeon: Belva Crome, MD;  Location: [facility];  Service: Cardiovascular;  Laterality: N/A;  . MYOMECTOMY     age 36  . PELVIC ABCESS DRAINAGE    . SHOULDER OPEN ROTATOR CUFF REPAIR Left 11/16/2014   Procedure: LEFT MINI OPEN ROTATOR CUFF REPAIR SHOULDER OPEN WITH SUBACROMIAL DECOMPRESSION;  Surgeon: Susa Day, MD;  Location: [facility];  Service: Orthopedics;  Laterality: Left;    Current Medications: Current Meds  Medication Sig  . Acetaminophen-Caffeine (EXCEDRIN ASPIRIN FREE PO) Take 2 tablets by mouth daily as needed (headaches).   . Artificial Tear Solution (SOOTHE XP) SOLN Place 1 drop into both eyes daily as needed (dry eyes).  Marland Kitchen b complex vitamins tablet Take 1 tablet by mouth daily.  . Calcium Citrate-Vitamin D (CITRACAL + D PO) Take 1 tablet by mouth 2 (two) times daily.  . clopidogrel (PLAVIX) 75 MG tablet TAKE 1 TABLET BY MOUTH  DAILY  . diltiazem (DILTIAZEM CD) 120 MG 24 hr capsule Take 120 mg by mouth daily.  . diphenhydrAMINE (BENADRYL) 25 MG tablet Take 25 mg by mouth daily as needed for allergies.   Marland Kitchen escitalopram (LEXAPRO) 10 MG tablet TAKE 1 TABLET BY MOUTH  DAILY  . fluticasone (FLONASE) 50 MCG/ACT nasal spray Place 2 sprays into both nostrils at bedtime.  . isosorbide mononitrate (IMDUR) 30 MG 24 hr tablet TAKE ONE-HALF TABLET BY  MOUTH AT BEDTIME  . lisinopril (ZESTRIL) 20 MG tablet TAKE 1 TABLET BY MOUTH  DAILY  . metoprolol succinate (TOPROL-XL) 25 MG 24 hr tablet TAKE 1 TABLET BY MOUTH  DAILY  . montelukast (SINGULAIR) 10 MG tablet Take 10  mg by mouth at bedtime.  . Multiple Vitamins-Minerals (CENTRUM SILVER PO) Take 1 tablet by mouth daily.   . Multiple Vitamins-Minerals (PRESERVISION AREDS 2) CAPS Take 1 capsule by mouth 2 (two) times daily.  Marland Kitchen omeprazole (PRILOSEC) 20 MG capsule Take 1 capsule (20 mg total) by mouth daily.  . Ranibizumab (LUCENTIS IO) Inject into the eye as directed. Eye injections every 8 - 10 weeks - unsure of dose  . rosuvastatin (CRESTOR) 10 MG tablet Take 1 tablet (10 mg total) by mouth daily.  . valACYclovir (VALTREX) 500 MG tablet TAKE 1 TABLET BY MOUTH TWICE A DAY FOR 3 DAYS AS NEEDED  . [DISCONTINUED] simvastatin (ZOCOR) 40 MG tablet TAKE 1 TABLET BY MOUTH AT  BEDTIME  Allergies:   Other, Tomato, and Valium   Social History   Socioeconomic History  . Marital status: Married    Spouse name: Not on file  . Number of children: 0  . Years of education: 72  . Highest education level: Not on file  Occupational History    Employer: [employer]  Tobacco Use  . Smoking status: Current Some Day Smoker    Packs/day: 0.50    Types: Cigarettes  . Smokeless tobacco: Never Used  . Tobacco comment: currently using e-cigs  Vaping Use  . Vaping Use: Former  Substance and Sexual Activity  . Alcohol use: No  . Drug use: No  . Sexual activity: Not Currently    Partners: Male    Birth control/protection: Surgical    Comment: TAH  Other Topics Concern  . Not on file  Social History Narrative  . Not on file   Social Determinants of Health   Financial Resource Strain: Not on file  Food Insecurity: Not on file  Transportation Needs: Not on file  Physical Activity: Not on file  Stress: Not on file  Social Connections: Not on file     Family History: The patient'sfamily history includes Alcohol abuse in her father and mother; Breast cancer (age of onset: 33) in her maternal grandmother; Breast cancer (age of onset: 44) in her cousin; Breast cancer (age of onset: 21) in her maternal aunt;  Breast cancer (age of onset: 23) in her mother; Breast cancer (age of onset: 64) in her maternal aunt; Cancer (age of onset: 65) in her cousin; Cancer - Lung in her father; Leukemia (age of onset: 60) in her sister; Lung cancer in her father; Stomach cancer (age of onset: 11) in her paternal aunt.  ROS:   Please see the history of present illness.    (+) (+) All other systems reviewed and are negative.  EKGs/Labs/Other Studies Reviewed:    The following studies were reviewed today: Cath 11/16/17:  Review of prior images demonstrates distal LAD SCAD in December 2002 that healed spontaneously.  10 months later, October 2003, SCAD in distal RCA treated with stenting.  Left main is normal  LAD is large and wraps around the apex.  LAD is normal and previous site of SCAD 15 years ago is normal.  Circumflex coronary artery is normal.  RCA is widely patent.  Segmental 30% proximal to mid atherosclerotic disease.  Mid to distal RCA stents are widely patent.  The stents are in the distribution of previous SCAD October 2003.  Inferobasal akinesis.  EF 45 to 50%.  Normal LVEDP.  RECOMMENDATIONS:   Continue aggressive primary prevention/risk factor modification: Monitor glycemic control, blood pressure less than 130/80 mmHg, LDL less than 70, smoking cessation, and at least 150 minutes of moderate aerobic activity per week  Current symptoms not felt to represent ischemia/obstructive CAD.  TTE 2018: Study Conclusions   - Left ventricle: The cavity size was mildly dilated. Systolic  function was normal. The estimated ejection fraction was in the  range of 55% to 60%. Wall motion was normal; there were no  regional wall motion abnormalities. Features are consistent with  a pseudonormal left ventricular filling pattern, with concomitant  abnormal relaxation and increased filling pressure (grade 2  diastolic dysfunction). Doppler parameters are consistent with  high ventricular  filling pressure.  - Aortic valve: There was mild regurgitation.  - Mitral valve: There was trivial regurgitation.  Myoview 09/25/16: Normal exercise treadmill stress test, no ischemia. Normal exercise  capacity. Normal BP response to stress.    EKG:   05/28/20-EKG was not ordered today.   Recent Labs: 02/01/2020: ALT 18; BUN 17; Creatinine, Ser 0.71; Hemoglobin 14.9; Platelets 240; Potassium 4.5; Sodium 142  Recent Lipid Panel    Component Value Date/Time   CHOL 167 02/01/2020 0857   TRIG 187 (H) 02/01/2020 0857   HDL 58 02/01/2020 0857   CHOLHDL 2.9 02/01/2020 0857   CHOLHDL 3.5 01/30/2016 1128   VLDL 71 (H) 01/30/2016 1128   LDLCALC 78 02/01/2020 0857     Risk Assessment/Calculations:       Physical Exam:    VS:  BP 124/74   Pulse 64   Ht 5\' 7"  (1.702 m)   Wt 167 lb 3.2 oz (75.8 kg)   LMP 02/18/1995   SpO2 96%   BMI 26.19 kg/m     Wt Readings from Last 3 Encounters:  05/28/20 167 lb 3.2 oz (75.8 kg)  02/01/20 163 lb 12.8 oz (74.3 kg)  08/24/19 156 lb (70.8 kg)     GEN: Well nourished, well developed in no acute distress HEENT: Normal NECK: No JVD; No carotid bruits LYMPHATICS: No lymphadenopathy CARDIAC: RRR, 1/6 systolic murmur best heard at the upper right boarder, rubs, gallops RESPIRATORY:  Clear to auscultation without rales, wheezing or rhonchi  ABDOMEN: Soft, non-tender, non-distended MUSCULOSKELETAL:  No edema; No deformity  SKIN: Warm and dry NEUROLOGIC:  Alert and oriented x 3 PSYCHIATRIC:  Normal affect   ASSESSMENT:    1. Coronary artery disease involving native coronary artery of native heart without angina pectoris   2. Essential hypertension   3. Hyperlipidemia, unspecified hyperlipidemia type   4. Coronary vasospasm (HCC)   5. Primary hypertension   6. Spontaneous dissection of coronary artery   7. Dyspnea on exertion    PLAN:   In order of problems listed above:  #CAD: #History of SCAD: #Coronary Vasospasm Remote history of  MI with PCI to dRCA, prior SCAD and vasospasm. Cath in 2019 with no significant obstructive; healed LAD SCAD, patent RCA stents. Has been managed on plavix for SCAD/RCA stents and imdur/dilt for vasospasm. Has intermittent chest pressure, but no significant symptoms and has not used nitro -Continue plavix 75mg  daily -Continue imdur 30mg  daily -Continue dilt 120mg  daily -Continue lisinopril 20mg  daily -Continue metop 25mg  XL daily -Continue simvastatin 40mg  daily  #HTN: Well controlled. -Continue imdur 30mg  daily -Continue lisinopril 20mg  daily -Continue metop 25mg  XL daily -Continue dilt 120mg  daily  #DOE:  Chronic. Cath in 2019 without significant obstructive disease as above. TTE with normal LVEF, mild AR and TR. -Continue management of CAD as above -Continue blood pressure control -TTE at next visit in 61months for monitoring of MR/TR  #HLD: -Change simva to to crestor 10mg  daily due to body aches -LDL controlled at 59  #Mild AR: #Mild TR: -Monitor serially -Next echo at follow-up visit in 1months  Follow in 6 months  Medication Adjustments/Labs and Tests Ordered: Current medicines are reviewed at length with the patient today.  Concerns regarding medicines are outlined above.  No orders of the defined types were placed in this encounter.  Meds ordered this encounter  Medications  . rosuvastatin (CRESTOR) 10 MG tablet    Sig: Take 1 tablet (10 mg total) by mouth daily.    Dispense:  90 tablet    Refill:  1    Patient Instructions  Medication Instructions:   STOP TAKING SIMVASTATIN NOW  START TAKING ROSUVASTATIN (CRESTOR) 10 MG BY MOUTH  DAILY  *If you need a refill on your cardiac medications before your next appointment, please call your pharmacy*    Follow-Up: At [facility], you and your health needs are our priority.  As part of our continuing mission to provide you with exceptional heart care, we have created designated Provider Care Teams.  These Care  Teams include your primary Cardiologist (physician) and Advanced Practice Providers (APPs -  Physician Assistants and Nurse Practitioners) who all work together to provide you with the care you need, when you need it.  We recommend signing up for the patient portal called "MyChart".  Sign up information is provided on this After Visit Summary.  MyChart is used to connect with patients for Virtual Visits (Telemedicine).  Patients are able to view lab/test results, encounter notes, upcoming appointments, etc.  Non-urgent messages can be sent to your provider as well.   To learn more about what you can do with MyChart, go to [email or web address].    Your next appointment:   6 month(s)  The format for your next appointment:   In Person  Provider:   You will see one of the following Advanced Practice Providers on your designated Care Team:    Richardson Dopp, PA-C  Vin Bhagat, PA-C    Other Instructions  PLEASE [place] LEG CRAMPS AND RESTLESS LEG SYNDROME      I,Alexis Bryant,acting as a scribe for Freada Bergeron, MD.,have documented all relevant documentation on the behalf of Freada Bergeron, MD,as directed by  Freada Bergeron, MD while in the presence of Freada Bergeron, MD.  I, Freada Bergeron, MD, have reviewed all documentation for this visit. The documentation on 05/28/20 for the exam, diagnosis, procedures, and orders are all accurate and complete.  Signed, Freada Bergeron, MD  05/28/2020 11:31 AM    [place]

## 2020-09-05 ENCOUNTER — Other Ambulatory Visit: Payer: Self-pay

## 2020-09-05 MED ORDER — METOPROLOL SUCCINATE ER 25 MG PO TB24: 25.0000 mg | ORAL_TABLET | Freq: Every day | ORAL | 3 refills | Status: DC

## 2020-09-05 MED ORDER — ISOSORBIDE MONONITRATE ER 30 MG PO TB24: 15.0000 mg | ORAL_TABLET | Freq: Every day | ORAL | 3 refills | Status: DC

## 2020-09-21 ENCOUNTER — Telehealth: Payer: Self-pay | Admitting: Cardiology

## 2020-09-21 MED ORDER — LISINOPRIL 20 MG PO TABS: 20.0000 mg | ORAL_TABLET | Freq: Every day | ORAL | 3 refills | Status: DC

## 2020-09-21 MED ORDER — ISOSORBIDE MONONITRATE ER 30 MG PO TB24: 15.0000 mg | ORAL_TABLET | Freq: Every day | ORAL | 3 refills | Status: DC

## 2020-09-21 MED ORDER — CLOPIDOGREL BISULFATE 75 MG PO TABS: 75.0000 mg | ORAL_TABLET | Freq: Every day | ORAL | 3 refills | Status: DC

## 2020-09-21 MED ORDER — ROSUVASTATIN CALCIUM 10 MG PO TABS: 10.0000 mg | ORAL_TABLET | Freq: Every day | ORAL | 3 refills | Status: DC

## 2020-09-21 MED ORDER — METOPROLOL SUCCINATE ER 25 MG PO TB24: 25.0000 mg | ORAL_TABLET | Freq: Every day | ORAL | 3 refills | Status: DC

## 2020-09-21 MED ORDER — DILTIAZEM HCL ER COATED BEADS 120 MG PO CP24: 120.0000 mg | ORAL_CAPSULE | Freq: Every day | ORAL | 3 refills | Status: DC

## 2020-09-21 NOTE — Telephone Encounter (Signed)
Pt has been made aware that we have sent in refills for everything that she requested, except Lexapro.  Pt was advised to contact her PCP for that one.  She verbalized understanding.

## 2020-09-21 NOTE — Telephone Encounter (Signed)
*  STAT* If patient is at the pharmacy, call can be transferred to refill team.   1. Which medications need to be refilled? (please list name of each medication and dose if known) rosuvastatin (CRESTOR) 10 MG tablet clopidogrel (PLAVIX) 75 MG tablet isosorbide mononitrate (IMDUR) 30 MG 24 hr tablet  metoprolol succinate (TOPROL-XL) 25 MG 24 hr tablet escitalopram (LEXAPRO) 10 MG tablet lisinopril (ZESTRIL) 20 MG tablet diltiazem (DILTIAZEM CD) 120 MG 24 hr capsule 2. Which pharmacy/location (including street and city if local pharmacy) is medication to be sent to? [facility]  ([place], [place]  3. Do they need a 30 day or 90 day supply? 90 day supply

## 2020-10-04 ENCOUNTER — Other Ambulatory Visit: Payer: Self-pay

## 2020-10-04 MED ORDER — DILTIAZEM HCL ER COATED BEADS 120 MG PO CP24: 120.0000 mg | ORAL_CAPSULE | Freq: Every day | ORAL | 3 refills | Status: DC

## 2020-10-04 MED ORDER — ESCITALOPRAM OXALATE 10 MG PO TABS: 10.0000 mg | ORAL_TABLET | Freq: Every day | ORAL | 3 refills | Status: DC

## 2020-10-25 ENCOUNTER — Other Ambulatory Visit: Payer: Self-pay | Admitting: *Deleted

## 2020-10-25 MED ORDER — LISINOPRIL 20 MG PO TABS: 20.0000 mg | ORAL_TABLET | Freq: Every day | ORAL | 3 refills | Status: DC

## 2020-10-25 MED ORDER — ROSUVASTATIN CALCIUM 10 MG PO TABS: 10.0000 mg | ORAL_TABLET | Freq: Every day | ORAL | 3 refills | Status: DC

## 2020-10-25 MED ORDER — CLOPIDOGREL BISULFATE 75 MG PO TABS: 75.0000 mg | ORAL_TABLET | Freq: Every day | ORAL | 3 refills | Status: DC

## 2020-12-03 ENCOUNTER — Other Ambulatory Visit: Payer: Self-pay | Admitting: Obstetrics and Gynecology

## 2020-12-03 DIAGNOSIS — Z1231 Encounter for screening mammogram for malignant neoplasm of breast: Secondary | ICD-10-CM

## 2021-01-01 ENCOUNTER — Ambulatory Visit: Payer: Medicare Other

## 2021-01-08 ENCOUNTER — Ambulatory Visit
Admission: RE | Admit: 2021-01-08 | Discharge: 2021-01-08 | Disposition: A | Discharge status: Home / Self Care | Payer: Medicare Other | Source: Ambulatory Visit | Attending: Obstetrics and Gynecology | Admitting: Obstetrics and Gynecology

## 2021-01-08 DIAGNOSIS — Z1231 Encounter for screening mammogram for malignant neoplasm of breast: Secondary | ICD-10-CM

## 2021-02-19 NOTE — Progress Notes (Signed)
Cardiology Office Note:    Date:  02/22/2021   ID:  Donna Bullock, DOB January 23, 1956, MRN [id number]  PCP:  Donna Gravel, MD   [facility]  Cardiologist:  Donna Chandler, MD  Advanced Practice Provider:  No care team member to display Electrophysiologist:  None    Referring MD: Donna Gravel, MD    History of Present Illness:    Donna Bullock is a 66 y.o. female with a hx of CAD, HLD, HTN, RA, and tobacco abuse who was previously followed by Donna Bullock now presenting to clinic for follow-up.  Her CAD dates back to 1996 when she had a stent placed in her RCA. 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 Cath showed a 75% distal LAD narrowing and this was managed conservatively with relook cath two days later showing 50% distal stenosis. She then had an inferior MI in 2003 with placement of overlapping Cypher drug eluting stents in the RCA. The LAD and Circumflex had mild disease per report. Four days later, she had severe chest pain, cath showed severe spasm of the LAD and Circumflex which resolved with IC vasodilators. Cath 2007 and showed patency of stents in the RCA and no other obstructive disease in the LAD and Circumflex. Remote stress test was in November 2009 and showed normal LVEF with no evidence of ischemia. She was seen in the ED 09/02/13 with bright red blood per rectum. Her ASA and Plavix were held. Colonoscopy 09/09/13 per Dr. Collene Mares with polyps removed but no other bleeding source identified. Aspirin was stopped.  She was lost to follow-up until August of 2016 where she saw Donna Bullock. Underwent myoview with no ischemia but EF depressed. Had repeat cath which showed Distal LAD SCAD healed, no LM disease, LAD is large without significant disease. RCA 30% proximal to mid atherosclerotic disease. Mid to distal RCA stents patent. LV gram with inferobasal akinesis. Was recommened for continued medical management. She was last seen by Donna Bullock  02/01/20 she was doing well with no significant symptoms.   Last seen by me on 05/28/20 where she was doing overall okay. Had chronic myalgias and DOE that were unchanged.   Today, the patient states that her husband passed away in 2020/07/21 and things have been incredibly difficult since that time. She has not been as motivated to walk as much, but states her DOE was improved when she was walking regularly. No lightheadedness, dizziness or syncope. Has been eating more salt and feels more fluid is on board. No palpitations. Blood pressure elevated today.   Past Medical History:  Diagnosis Date   Arthritis    rheumatoid arthritis, and osteoarthritis    Benign tumor of adrenal gland    CAD (coronary artery disease)    Inferior MI 1996 tx with bare metal stent RCA. Repeat  anterior MI 2002 with LAD vasospasm. Repeat inferior MI 2003 with occlusion RCA with overlapping Cypher DES in RCA. Repeat cath 2003 with profound vasospasm LAd and Circumflex. Repeat cath 2007  with patent RCA and no disease in LAD or Circumflex.   Depression    Environmental allergies    Family history of adverse reaction to anesthesia    mother had ? problem with anesthesia - was alcoholic - "mind was never right again"   Family history of breast cancer    Family history of leukemia    Family history of stomach cancer    Fibromyalgia    GERD (gastroesophageal reflux  disease)    Heart murmur    History of kidney stones    History of stomach ulcers    due to taking aspirin for headaches   HTN (hypertension)    Hyperlipidemia    Myocardial infarction ([facility])    times 4 "very minor" stents x2    Osteopenia    Osteoporosis    Rotator cuff tear    left   Sleep apnea    uses c pap - does not know settings    Urinary incontinence     Past Surgical History:  Procedure Laterality Date   ABDOMINAL HYSTERECTOMY     BACK SURGERY     CYSTOSCOPY WITH URETEROSCOPY AND STENT PLACEMENT Left 08/24/2019   Procedure: CYSTOSCOPY  WITH DIAGNOSTIC URETEROSCOPY , REMOVAL OF STONES AND LEFT  STENT PLACEMENT;  Surgeon: Ardis Hughs, MD;  Location: [facility];  Service: Urology;  Laterality: Left;   LAPAROSCOPIC LYSIS OF ADHESIONS     LEFT HEART CATH AND CORONARY ANGIOGRAPHY N/A 11/16/2017   Procedure: LEFT HEART CATH AND CORONARY ANGIOGRAPHY;  Surgeon: Belva Crome, MD;  Location: [place] [facility];  Service: Cardiovascular;  Laterality: N/A;   MYOMECTOMY     age 71   PELVIC ABCESS DRAINAGE     SHOULDER OPEN ROTATOR CUFF REPAIR Left 11/16/2014   Procedure: LEFT MINI OPEN ROTATOR CUFF REPAIR SHOULDER OPEN WITH SUBACROMIAL DECOMPRESSION;  Surgeon: Susa Day, MD;  Location: [facility];  Service: Orthopedics;  Laterality: Left;    Current Medications: Current Meds  Medication Sig   Acetaminophen-Caffeine (EXCEDRIN ASPIRIN FREE PO) Take 2 tablets by mouth daily as needed (headaches).    Artificial Tear Solution (SOOTHE XP) SOLN Place 1 drop into both eyes daily as needed (dry eyes).   b complex vitamins tablet Take 1 tablet by mouth daily.   Calcium Citrate-Vitamin D (CITRACAL + D PO) Take 1 tablet by mouth 2 (two) times daily.   clopidogrel (PLAVIX) 75 MG tablet Take 1 tablet (75 mg total) by mouth daily.   diltiazem (DILTIAZEM CD) 120 MG 24 hr capsule Take 1 capsule (120 mg total) by mouth daily.   diphenhydrAMINE (BENADRYL) 25 MG tablet Take 25 mg by mouth daily as needed for allergies.    escitalopram (LEXAPRO) 10 MG tablet Take 1 tablet (10 mg total) by mouth daily.   fluticasone (FLONASE) 50 MCG/ACT nasal spray Place 2 sprays into both nostrils at bedtime.   isosorbide mononitrate (IMDUR) 30 MG 24 hr tablet Take 0.5 tablets (15 mg total) by mouth at bedtime.   lisinopril (ZESTRIL) 20 MG tablet Take 1 tablet (20 mg total) by mouth daily.   metoprolol succinate (TOPROL-XL) 25 MG 24 hr tablet Take 1 tablet (25 mg total) by mouth daily.   montelukast (SINGULAIR) 10 MG tablet Take 10 mg by mouth at bedtime.   Multiple  Vitamins-Minerals (CENTRUM SILVER PO) Take 1 tablet by mouth daily.    Multiple Vitamins-Minerals (PRESERVISION AREDS 2) CAPS Take 1 capsule by mouth 2 (two) times daily.   omeprazole (PRILOSEC) 20 MG capsule Take 1 capsule (20 mg total) by mouth daily.   Ranibizumab (LUCENTIS IO) Inject into the eye as directed. Eye injections every 8 - 10 weeks - unsure of dose   rosuvastatin (CRESTOR) 10 MG tablet Take 1 tablet (10 mg total) by mouth daily.   spironolactone (ALDACTONE) 25 MG tablet Take 0.5 tablets (12.5 mg total) by mouth daily.     Allergies:   Other, Tomato, and Valium  Social History   Socioeconomic History   Marital status: Married    Spouse name: Not on file   Number of children: 0   Years of education: 13   Highest education level: Not on file  Occupational History    Employer: [employer]  Tobacco Use   Smoking status: Some Days    Packs/day: 0.50    Types: Cigarettes   Smokeless tobacco: Never   Tobacco comments:    currently using e-cigs  Vaping Use   Vaping Use: Former  Substance and Sexual Activity   Alcohol use: No   Drug use: No   Sexual activity: Not Currently    Partners: Male    Birth control/protection: Surgical    Comment: TAH  Other Topics Concern   Not on file  Social History Narrative   Not on file   Social Determinants of Health   Financial Resource Strain: Not on file  Food Insecurity: Not on file  Transportation Needs: Not on file  Physical Activity: Not on file  Stress: Not on file  Social Connections: Not on file     Family History: The patient'sfamily history includes Alcohol abuse in her father and mother; Breast cancer (age of onset: 71) in her maternal grandmother; Breast cancer (age of onset: 57) in her cousin; Breast cancer (age of onset: 9) in her maternal aunt; Breast cancer (age of onset: 57) in her mother; Breast cancer (age of onset: 33) in her maternal aunt; Cancer (age of onset: 7) in her cousin; Cancer - Lung in  her father; Leukemia (age of onset: 18) in her sister; Lung cancer in her father; Stomach cancer (age of onset: 70) in her paternal aunt.  ROS:   Please see the history of present illness.    Review of Systems  Constitutional:  Positive for malaise/fatigue.  Respiratory:  Positive for shortness of breath.   Cardiovascular:  Negative for chest pain, palpitations, orthopnea, claudication, leg swelling and PND.  Gastrointestinal:  Negative for nausea and vomiting.  Musculoskeletal:  Negative for falls.  Neurological:  Negative for dizziness and loss of consciousness.  Psychiatric/Behavioral:  Positive for depression.     EKGs/Labs/Other Studies Reviewed:    The following studies were reviewed today: Cath 11/16/17: Review of prior images demonstrates distal LAD SCAD in December 2002 that healed spontaneously.  10 months later, October 2003, SCAD in distal RCA treated with stenting. Left main is normal LAD is large and wraps around the apex.  LAD is normal and previous site of SCAD 15 years ago is normal. Circumflex coronary artery is normal. RCA is widely patent.  Segmental 30% proximal to mid atherosclerotic disease.  Mid to distal RCA stents are widely patent.  The stents are in the distribution of previous SCAD October 2003. Inferobasal akinesis.  EF 45 to 50%.  Normal LVEDP.   RECOMMENDATIONS:   Continue aggressive primary prevention/risk factor modification: Monitor glycemic control, blood pressure less than 130/80 mmHg, LDL less than 70, smoking cessation, and at least 150 minutes of moderate aerobic activity per week Current symptoms not felt to represent ischemia/obstructive CAD.  TTE 2018: Study Conclusions   - Left ventricle: The cavity size was mildly dilated. Systolic    function was normal. The estimated ejection fraction was in the    range of 55% to 60%. Wall motion was normal; there were no    regional wall motion abnormalities. Features are consistent with    a  pseudonormal left ventricular filling pattern, with concomitant  abnormal relaxation and increased filling pressure (grade 2    diastolic dysfunction). Doppler parameters are consistent with    high ventricular filling pressure.  - Aortic valve: There was mild regurgitation.  - Mitral valve: There was trivial regurgitation.  Myoview 09/25/16: Normal exercise treadmill stress test, no ischemia. Normal exercise capacity. Normal BP response to stress.    EKG:   05/28/20-EKG was not ordered today.  02/22/21: ECG with SB with HR 57  Recent Labs: No results found for requested labs within last 8760 hours.  Recent Lipid Panel    Component Value Date/Time   CHOL 167 02/01/2020 0857   TRIG 187 (H) 02/01/2020 0857   HDL 58 02/01/2020 0857   CHOLHDL 2.9 02/01/2020 0857   CHOLHDL 3.5 01/30/2016 1128   VLDL 71 (H) 01/30/2016 1128   LDLCALC 78 02/01/2020 0857     Risk Assessment/Calculations:       Physical Exam:    VS:  BP (!) 148/88    Pulse (!) 55    Ht 5\' 7"  (1.702 m)    Wt 172 lb 9.6 oz (78.3 kg)    LMP 02/18/1995    SpO2 97%    BMI 27.03 kg/m     Wt Readings from Last 3 Encounters:  02/22/21 172 lb 9.6 oz (78.3 kg)  05/28/20 167 lb 3.2 oz (75.8 kg)  02/01/20 163 lb 12.8 oz (74.3 kg)     GEN: Well nourished, well developed in no acute distress HEENT: Normal NECK: No JVD; No carotid bruits CARDIAC: RRR, 2/6 systolic murmur best heard at the RUSB. No rubs, gallops RESPIRATORY:  Clear to auscultation without rales, wheezing or rhonchi  ABDOMEN: Soft, non-tender, non-distended MUSCULOSKELETAL:  No edema; No deformity  SKIN: Warm and dry NEUROLOGIC:  Alert and oriented x 3 PSYCHIATRIC:  Normal affect   ASSESSMENT:    1. Spontaneous dissection of coronary artery   2. Medication management   3. Aortic valve insufficiency, etiology of cardiac valve disease unspecified   4. Hyperlipidemia, unspecified hyperlipidemia type   5. Coronary artery disease involving native  coronary artery of native heart without angina pectoris   6. Essential hypertension   7. 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 Dyspnea on exertion     PLAN:   In order of problems listed above:  #CAD: #History of SCAD: #Coronary Vasospasm Remote history of MI with PCI to dRCA, prior SCAD and vasospasm. Cath in 2019 with no significant obstructive; healed LAD SCAD, patent RCA stents. Has been managed on plavix for SCAD/RCA stents and imdur/dilt for vasospasm. Has intermittent chest pressure, but no significant symptoms and has not used nitro -Continue plavix 75mg  daily -Continue imdur 30mg  daily -Continue dilt 120mg  daily -Continue lisinopril 20mg  daily -Continue metop 25mg  XL daily -Continue crestor 10mg  daily -Start spironolactone 12.5mg  daily  #HTN: Elevated today. -Start spiro 12.5mg  daily -Continue imdur 30mg  daily -Continue lisinopril 20mg  daily -Continue metop 25mg  XL daily -Continue dilt 120mg  daily -Keep BP log at home  #DOE:  Chronic. Cath in 2019 without significant obstructive disease as above. TTE with normal LVEF, mild AR and TR. -Continue management of CAD as above -Continue blood pressure control -TTE at next visit in 39months for monitoring of MR/TR  #HLD: LDL controlled at 45 at PCP office -Continue crestor 10mg  daily due to body aches -LDL goal <55  #Mild AR: #Mild TR: -Repeat TTE for serial monitoring  Follow in 6 months  Medication Adjustments/Labs and Tests Ordered: Current medicines are reviewed at length with  the patient today.  Concerns regarding medicines are outlined above.  Orders Placed This Encounter  Procedures   Basic metabolic panel   EKG [id number]   ECHOCARDIOGRAM COMPLETE   Meds ordered this encounter  Medications   spironolactone (ALDACTONE) 25 MG tablet    Sig: Take 0.5 tablets (12.5 mg total) by mouth daily.    Dispense:  45 tablet    Refill:  3    Patient Instructions  Medication Instructions:   START TAKING SPIRONOLACTONE  12.5 MG BY MOUTH DAILY  *If you need a refill on your cardiac medications before your next appointment, please call your pharmacy*   Lab Work:  [place] OFFICE--CHECK BMET  If you have labs (blood work) drawn today and your tests are completely normal, you will receive your results only by: [place] (if you have MyChart) OR A paper copy in the mail If you have any lab test that is abnormal or we need to change your treatment, we will call you to review the results.   Testing/Procedures:  Your physician has requested that you have an echocardiogram. Echocardiography is a painless test that uses sound waves to create images of your heart. It provides your doctor with information about the size and shape of your heart and how well your hearts chambers and valves are working. This procedure takes approximately one hour. There are no restrictions for this procedure.   Follow-Up: At [facility], you and your health needs are our priority.  As part of our continuing mission to provide you with exceptional heart care, we have created designated Provider Care Teams.  These Care Teams include your primary Cardiologist (physician) and Advanced Practice Providers (APPs -  Physician Assistants and Nurse Practitioners) who all work together to provide you with the care you need, when you need it.  We recommend signing up for the patient portal called "MyChart".  Sign up information is provided on this After Visit Summary.  MyChart is used to connect with patients for Virtual Visits (Telemedicine).  Patients are able to view lab/test results, encounter notes, upcoming appointments, etc.  Non-urgent messages can be sent to your provider as well.   To learn more about what you can do with MyChart, go to [email or web address].    Your next appointment:   6 month(s)  The format for your next appointment:   In Person  Provider:   DR. Johney Frame   Other Instructions  DR.  Tehillah Cipriani WANTS YOU TO TAKE YOUR BLOOD PRESSURES AND LOG THEM FOR THE NEXT 5 DAYS AND SEND HER YOUR RECORDINGS VIA MYCHART THEREAFTER     I,Alexis Bryant,acting as a scribe for Freada Bergeron, MD.,have documented all relevant documentation on the behalf of Freada Bergeron, MD,as directed by  Freada Bergeron, MD while in the presence of Freada Bergeron, MD.  I, Freada Bergeron, MD, have reviewed all documentation for this visit. The documentation on 02/22/21 for the exam, diagnosis, procedures, and orders are all accurate and complete.  Signed, Freada Bergeron, MD  02/22/2021 10:28 AM    [place]

## 2021-02-22 ENCOUNTER — Ambulatory Visit (INDEPENDENT_AMBULATORY_CARE_PROVIDER_SITE_OTHER): Payer: Medicare Other | Admitting: Cardiology

## 2021-02-22 ENCOUNTER — Encounter: Payer: Self-pay | Admitting: Cardiology

## 2021-02-22 ENCOUNTER — Other Ambulatory Visit: Payer: Self-pay

## 2021-02-22 VITALS — BP 148/88 | HR 55 | Ht 67.0 in | Wt 172.6 lb

## 2021-02-22 DIAGNOSIS — Z79899 Other long term (current) drug therapy: Secondary | ICD-10-CM

## 2021-02-22 DIAGNOSIS — I201 Angina pectoris with documented spasm: Secondary | ICD-10-CM

## 2021-02-22 DIAGNOSIS — I351 Nonrheumatic aortic (valve) insufficiency: Secondary | ICD-10-CM

## 2021-02-22 DIAGNOSIS — I2542 Coronary artery dissection: Secondary | ICD-10-CM

## 2021-02-22 DIAGNOSIS — R0609 Other forms of dyspnea: Secondary | ICD-10-CM

## 2021-02-22 DIAGNOSIS — I10 Essential (primary) hypertension: Secondary | ICD-10-CM

## 2021-02-22 DIAGNOSIS — I2510 Atherosclerotic heart disease of native coronary artery without angina pectoris: Secondary | ICD-10-CM

## 2021-02-22 DIAGNOSIS — E785 Hyperlipidemia, unspecified: Secondary | ICD-10-CM | POA: Diagnosis not present

## 2021-02-22 MED ORDER — SPIRONOLACTONE 25 MG PO TABS
12.5000 mg | ORAL_TABLET | Freq: Every day | ORAL | 3 refills | Status: DC
Start: 2021-02-22 — End: 2021-10-07

## 2021-02-22 NOTE — Patient Instructions (Signed)
Medication Instructions:   START TAKING SPIRONOLACTONE 12.5 MG BY MOUTH DAILY  *If you need a refill on your cardiac medications before your next appointment, please call your pharmacy*   Lab Work:  [place] OFFICE--CHECK BMET  If you have labs (blood work) drawn today and your tests are completely normal, you will receive your results only by: [place] (if you have MyChart) OR A paper copy in the mail If you have any lab test that is abnormal or we need to change your treatment, we will call you to review the results.   Testing/Procedures:  Your physician has requested that you have an echocardiogram. Echocardiography is a painless test that uses sound waves to create images of your heart. It provides your doctor with information about the size and shape of your heart and how well your hearts chambers and valves are working. This procedure takes approximately one hour. There are no restrictions for this procedure.   Follow-Up: At [facility], you and your health needs are our priority.  As part of our continuing mission to provide you with exceptional heart care, we have created designated Provider Care Teams.  These Care Teams include your primary Cardiologist (physician) and Advanced Practice Providers (APPs -  Physician Assistants and Nurse Practitioners) who all work together to provide you with the care you need, when you need it.  We recommend signing up for the patient portal called "MyChart".  Sign up information is provided on this After Visit Summary.  MyChart is used to connect with patients for Virtual Visits (Telemedicine).  Patients are able to view lab/test results, encounter notes, upcoming appointments, etc.  Non-urgent messages can be sent to your provider as well.   To learn more about what you can do with MyChart, go to [email or web address].    Your next appointment:   6 month(s)  The format for your next appointment:   In  Person  Provider:   DR. Johney Frame   Other Instructions  DR. PEMBERTON WANTS YOU TO TAKE YOUR BLOOD PRESSURES AND LOG THEM FOR THE NEXT 5 DAYS AND SEND HER YOUR RECORDINGS VIA [place] THEREAFTER

## 2021-03-01 ENCOUNTER — Other Ambulatory Visit: Payer: Medicare Other

## 2021-03-05 ENCOUNTER — Ambulatory Visit (HOSPITAL_COMMUNITY): Payer: Medicare Other

## 2021-03-05 ENCOUNTER — Other Ambulatory Visit: Payer: Medicare Other | Admitting: *Deleted

## 2021-03-05 ENCOUNTER — Other Ambulatory Visit: Payer: Self-pay

## 2021-03-05 DIAGNOSIS — I351 Nonrheumatic aortic (valve) insufficiency: Secondary | ICD-10-CM

## 2021-03-05 DIAGNOSIS — I2510 Atherosclerotic heart disease of native coronary artery without angina pectoris: Secondary | ICD-10-CM

## 2021-03-05 DIAGNOSIS — E785 Hyperlipidemia, unspecified: Secondary | ICD-10-CM

## 2021-03-05 DIAGNOSIS — Z79899 Other long term (current) drug therapy: Secondary | ICD-10-CM

## 2021-03-05 DIAGNOSIS — I10 Essential (primary) hypertension: Secondary | ICD-10-CM

## 2021-03-06 LAB — BASIC METABOLIC PANEL
BUN/Creatinine Ratio: 16 (ref 12–28)
BUN: 16 mg/dL (ref 8–27)
CO2: 24 mmol/L (ref 20–29)
Calcium: 10.1 mg/dL (ref 8.7–10.3)
Chloride: 103 mmol/L (ref 96–106)
Creatinine, Ser: 0.97 mg/dL (ref 0.57–1.00)
Glucose: 92 mg/dL (ref 70–99)
Potassium: 4.4 mmol/L (ref 3.5–5.2)
Sodium: 142 mmol/L (ref 134–144)
eGFR: 64 mL/min/{1.73_m2} (ref >59)

## 2021-03-22 ENCOUNTER — Other Ambulatory Visit: Payer: Self-pay

## 2021-03-22 ENCOUNTER — Ambulatory Visit (HOSPITAL_COMMUNITY): Payer: Medicare Other | Attending: Cardiovascular Disease

## 2021-03-22 DIAGNOSIS — I351 Nonrheumatic aortic (valve) insufficiency: Secondary | ICD-10-CM | POA: Diagnosis present

## 2021-03-22 LAB — ECHOCARDIOGRAM COMPLETE
Area-P 1/2: 1.86 cm2
P 1/2 time: 797 msec
S' Lateral: 3.5 cm

## 2021-04-21 IMAGING — MG DIGITAL SCREENING BILAT W/ TOMO W/ CAD
8 series · 8 of 24 positions shown · non-contrast
Comparison: Previous exam(s).

CLINICAL DATA: Screening.

EXAM:
DIGITAL SCREENING BILATERAL MAMMOGRAM WITH TOMO AND CAD

[L MLO synth-2D]
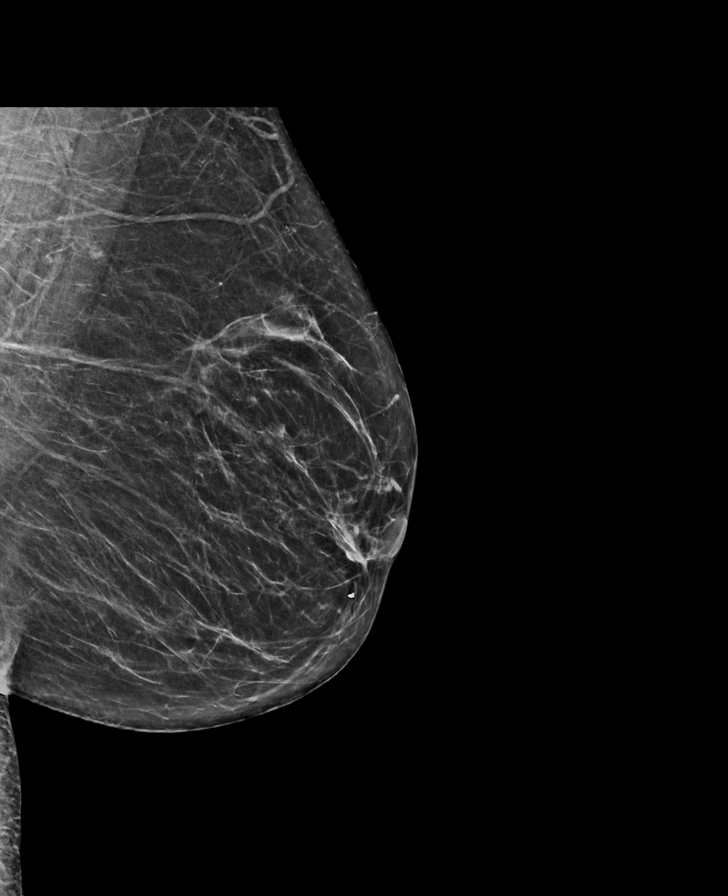

[L CC synth-2D]
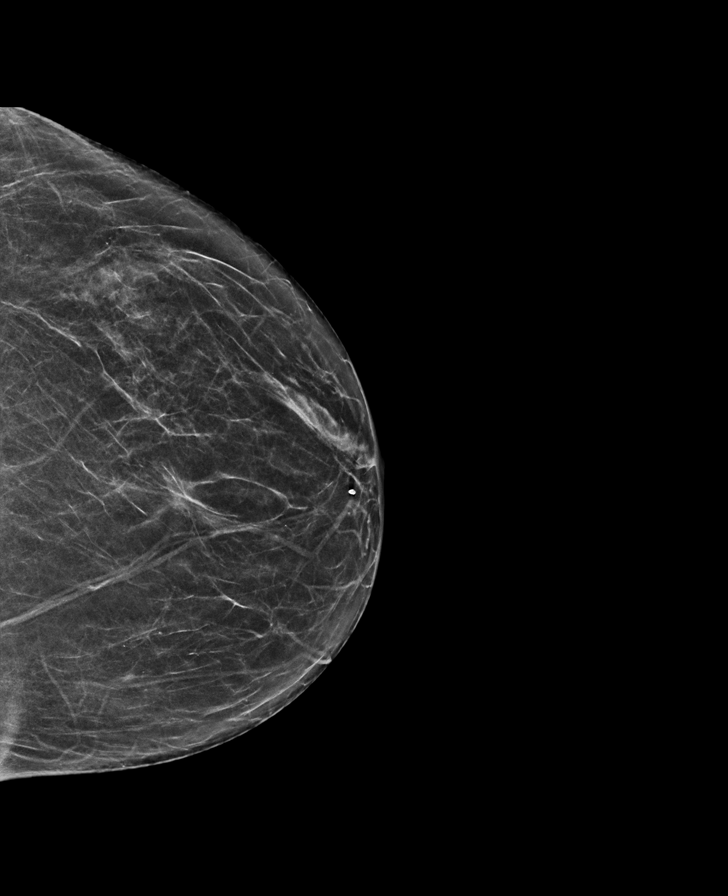

[R MLO synth-2D]
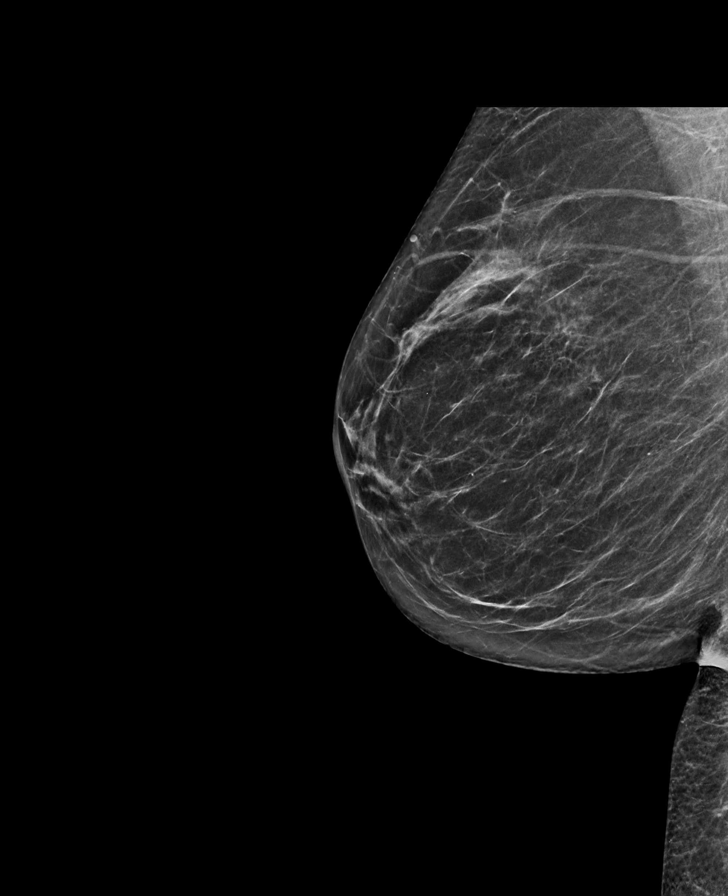

[R CC synth-2D]
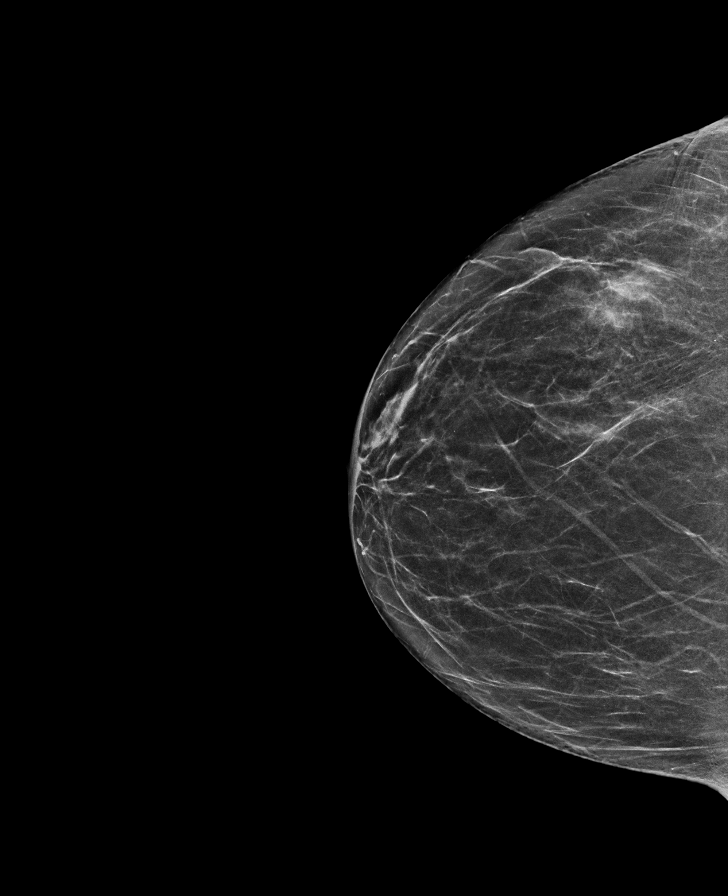

[L MLO tomo · tomo slice 31/60.0]
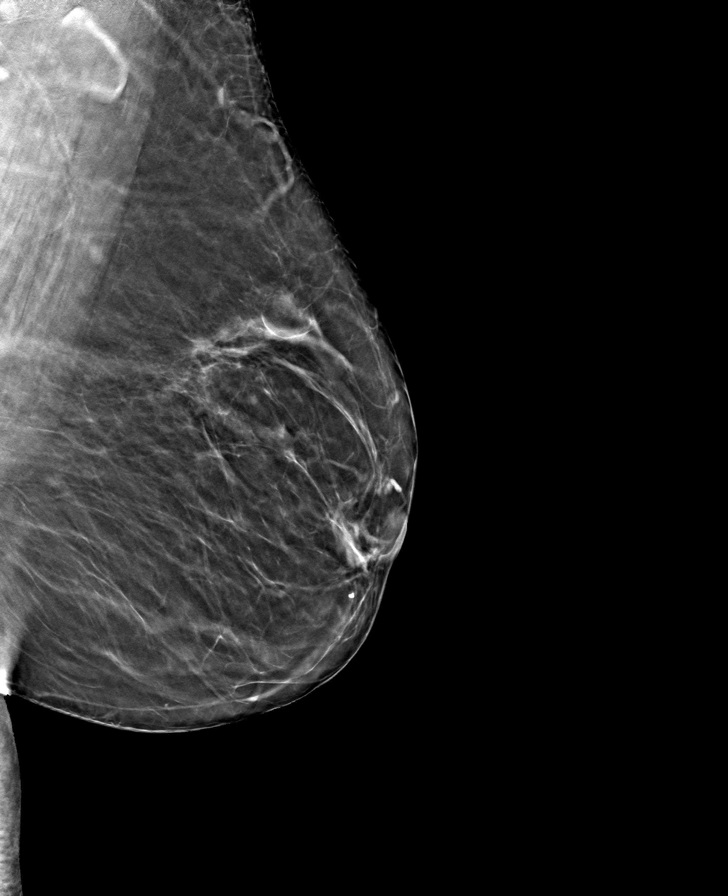

[L CC tomo · tomo slice 29/57.0]
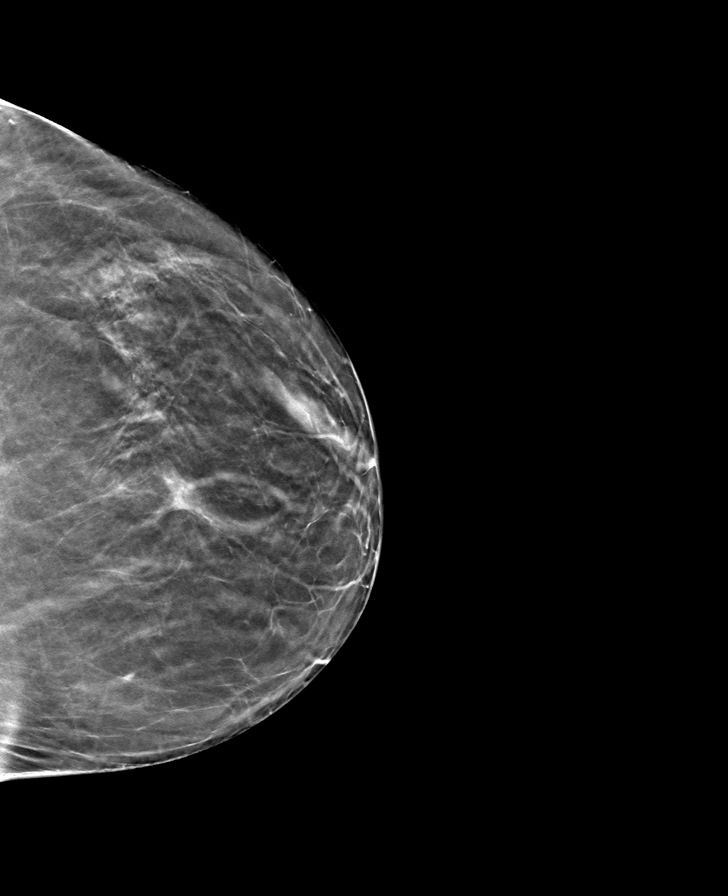

[R CC tomo · tomo slice 30/59.0]
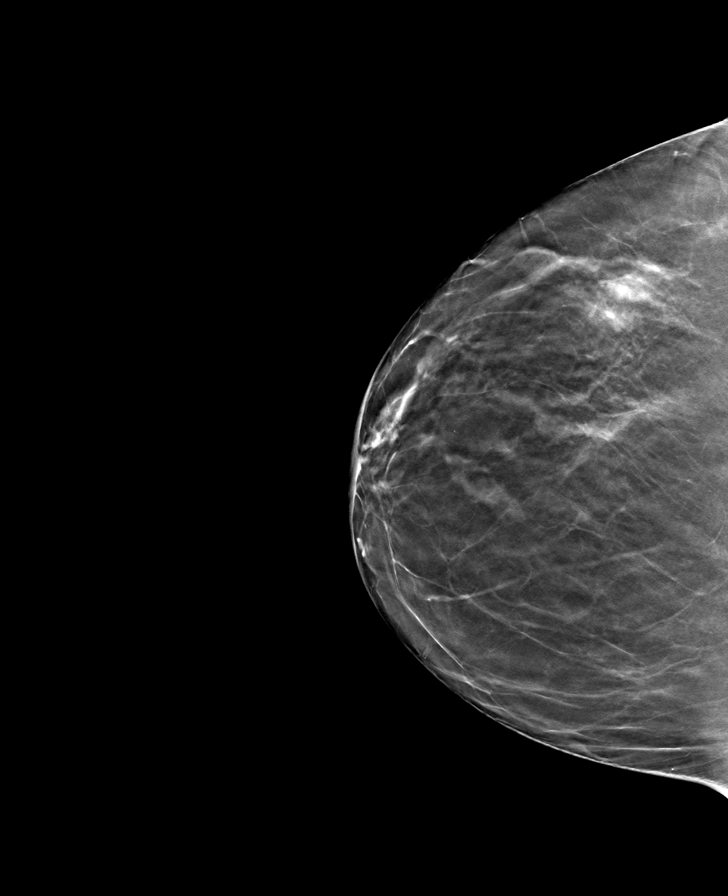

[R MLO tomo · tomo slice 31/61.0]
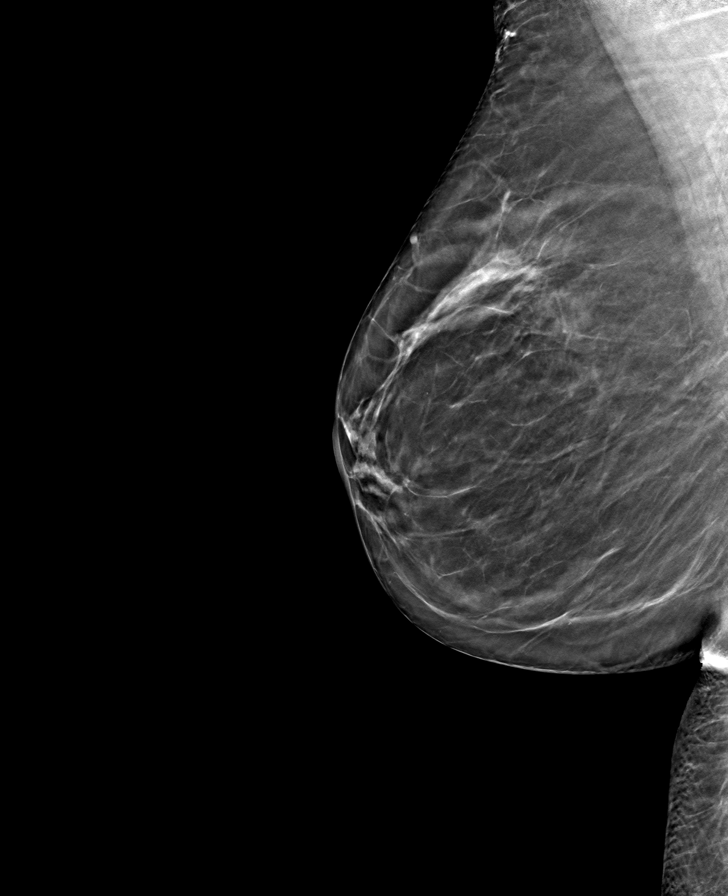

[8 of 24 positions shown; findings below may reference images not displayed]

ACR Breast Density Category b: There are scattered areas of
fibroglandular density.
FINDINGS: There are no findings suspicious for malignancy. Images were
processed with CAD.
IMPRESSION: No mammographic evidence of malignancy. A result letter of this
screening mammogram will be mailed directly to the patient.

RECOMMENDATION:
Screening mammogram in one year. (Code:[id number])

BI-RADS CATEGORY  1: Negative.

## 2021-07-17 DIAGNOSIS — M25551 Pain in right hip: Secondary | ICD-10-CM | POA: Insufficient documentation

## 2021-09-09 ENCOUNTER — Other Ambulatory Visit: Payer: Self-pay

## 2021-09-09 MED ORDER — METOPROLOL SUCCINATE ER 25 MG PO TB24: 25.0000 mg | ORAL_TABLET | Freq: Every day | ORAL | 1 refills | Status: DC

## 2021-09-12 ENCOUNTER — Telehealth: Payer: Self-pay

## 2021-09-12 NOTE — Telephone Encounter (Signed)
   Pre-operative Risk Assessment    Patient Name: Donna Bullock  DOB: 01-27-1956 MRN: [id number]      Request for Surgical Clearance    Procedure:   Right total hip arthroplasty  Date of Surgery:  Clearance 11/05/21                                 Surgeon:  Dr. Paralee Cancel Surgeon's Group or Practice Name:  [facility] Phone number:  [phone] Fax number:  [phone]   Type of Clearance Requested:   - Medical  - Pharmacy:  Hold Clopidogrel (Plavix) x 7 days   Type of Anesthesia:  Spinal   Additional requests/questions:   None  Signed, Ozetta Flatley   09/12/2021, 2:26 PM

## 2021-09-13 NOTE — Telephone Encounter (Signed)
   Name: Donna Bullock  DOB: 1955/11/20  MRN: [id number]  Primary Cardiologist: Lauree Chandler, MD  Chart reviewed as part of pre-operative protocol coverage. Because of Donna Bullock past medical history and time since last visit, she will require a follow-up in-office visit in order to better assess preoperative cardiovascular risk.  Pre-op covering staff: - Please schedule appointment and call patient to inform them. If patient already had an upcoming appointment within acceptable timeframe, please add "pre-op clearance" to the appointment notes so provider is aware. - Please contact requesting surgeon's office via preferred method (i.e, phone, fax) to inform them of need for appointment prior to surgery.  This message will also be routed to pharmacy pool and/or Dr. Johney Frame for input on holding Plavix for 7 days given history of overlapping stents as requested below so that this information is available to the clearing provider at time of patient's appointment.   Donna Sciara, NP  09/13/2021, 11:21 AM

## 2021-09-13 NOTE — Telephone Encounter (Signed)
1st attempt to reach pt regarding surgical clearance and the need for an IN OFFICE appointment. Left a message for pt to call back and get that scheduled.

## 2021-09-16 NOTE — Telephone Encounter (Signed)
I s/w the pt and she is agreeable to plan of care for in office appt. Pt also says she is now following Dr. Johney Frame. Pt has been scheduled for in office appt with Nicholes Rough, [facility] 10/07/21 @ 10:30 am . Pt thanked me for the call and the help.

## 2021-10-04 ENCOUNTER — Other Ambulatory Visit: Payer: Self-pay

## 2021-10-04 MED ORDER — ISOSORBIDE MONONITRATE ER 30 MG PO TB24: 15.0000 mg | ORAL_TABLET | Freq: Every day | ORAL | 1 refills | Status: DC

## 2021-10-04 NOTE — Progress Notes (Unsigned)
Office Visit    Patient Name: Donna Bullock Date of Encounter: 10/07/2021  PCP:  Jani Gravel, MD   [facility]  Cardiologist:  Freada Bergeron, MD  Advanced Practice Provider:  No care team member to display Electrophysiologist:  None    HPI    Donna Bullock is a 66 y.o. female with a past medical history of CAD, HLD, HTN, RA, and tobacco abuse presents today for pre-op clearance   Her CAD dates back to 1996 when she had a stent placed in her RCA.  She had an anterior MI with cardiac arrest (V-fib) in 2002.  Cath showed 75% distal LAD narrowing and this was managed conservatively with relook at her cardiac cath 2 days later showing 50% distal stenosis.  She then had an inferior MI in 2003 with placement of overlapping Cypher drug-eluting stents in the RCA.  The LAD and circumflex had mild disease.  4 days later she had severe chest pain, cardiac cath showed severe spasm of the LAD and circumflex which resolved with vasodilators.  Cath in 2007 showed patency of the stents in the RCA and no other obstructive disease in the LAD and circumflex.  Remote stress test in November 2009 showed normal LVEF and no evidence of ischemia.  She was seen in the ED 09/02/2013 with bright red blood per rectum.  Her ASA and Plavix were held.  Colonoscopy 09/09/2013 per Dr. Collene Mares with polyps removed but no other bleeding source identified.  Aspirin was stopped.  She was lost to follow-up until August 2016 where she saw Truitt Merle, NP.  She underwent Myoview with no ischemia but EF was depressed.  She had a repeat cardiac catheterization which showed distal LAD scad healed, no left main disease, LAD was large without significant disease.  RCA showed 30% proximal and mid atherosclerotic disease.  Mid to distal RCA stents patent.  LV gram with inferior basal akinesis.  Medical management was recommended.  She was last seen by Cecille Rubin 02/01/2020 and was doing well with no significant  symptoms.  She seen in the clinic 05/2020 was doing fine at that point.  She had chronic shortness of breath with exertion which was unchanged.  Usually it only occurred when walking up hills.  No symptoms when walking on flat ground.  She had intermittent brief episodes of chest pressure which resolved without intervention.  Had not been using nitro.  Blood pressure well controlled.  Had chronic diffuse myalgias and joint pain which she thinks was related to her RA.  Notably is on simvastatin but her body aches are chronic.  She was last seen in the clinic 02/22/2021 and had stated her husband passed away 2020-07-17 and things have been incredibly difficult since that time.  She not been motivated to walk as much and stated her DOE was improved when she was walking regularly.  Blood pressure was elevated.  Today, she states that Dr. Johney Frame prescribed spironolactone which made her extremely drowsy.  She went off the pill and her drowsiness got better.  She has not had any swelling in her legs.  She just has rheumatoid arthritis that she has swelling in her hands and feet from that.  Her mother broken her hip and did not come on anesthesia so she is nervous about anesthesia for her upcoming procedure.  We discussed potentially switching her from lisinopril to [facility] given her most recent EF.  She states she wants to discuss this with Dr.  Pemberton when she sees her in the fall.  She remains active and does a lot of her inside work and outside work.  She has a garden.  She is able to go up and down stairs without difficulty and does some swimming at times.  Because of this she received a 6.5 METS on the DASI.  This exceeds the minimum METS requirement.  Reports no shortness of breath nor dyspnea on exertion. Reports no chest pain, pressure, or tightness. No edema, orthopnea, PND. Reports no palpitations.    Past Medical History    Past Medical History:  Diagnosis Date   Arthritis    rheumatoid  arthritis, and osteoarthritis    Benign tumor of adrenal gland    CAD (coronary artery disease)    Inferior MI 1996 tx with bare metal stent RCA. Repeat  anterior MI 2002 with LAD vasospasm. Repeat inferior MI 2003 with occlusion RCA with overlapping Cypher DES in RCA. Repeat cath 2003 with profound vasospasm LAd and Circumflex. Repeat cath 2007  with patent RCA and no disease in LAD or Circumflex.   Depression    Environmental allergies    Family history of adverse reaction to anesthesia    mother had ? problem with anesthesia - was alcoholic - "mind was never right again"   Family history of breast cancer    Family history of leukemia    Family history of stomach cancer    Fibromyalgia    GERD (gastroesophageal reflux disease)    Heart murmur    History of kidney stones    History of stomach ulcers    due to taking aspirin for headaches   HTN (hypertension)    Hyperlipidemia    Myocardial infarction ([place])    times 4 "very minor" stents x2    Osteopenia    Osteoporosis    Rotator cuff tear    left   Sleep apnea    uses c pap - does not know settings    Urinary incontinence    Past Surgical History:  Procedure Laterality Date   ABDOMINAL HYSTERECTOMY     BACK SURGERY     CYSTOSCOPY WITH URETEROSCOPY AND STENT PLACEMENT Left 08/24/2019   Procedure: CYSTOSCOPY WITH DIAGNOSTIC URETEROSCOPY , REMOVAL OF STONES AND LEFT  STENT PLACEMENT;  Surgeon: Ardis Hughs, MD;  Location: [facility];  Service: Urology;  Laterality: Left;   LAPAROSCOPIC LYSIS OF ADHESIONS     LEFT HEART CATH AND CORONARY ANGIOGRAPHY N/A 11/16/2017   Procedure: LEFT HEART CATH AND CORONARY ANGIOGRAPHY;  Surgeon: Belva Crome, MD;  Location: [facility];  Service: Cardiovascular;  Laterality: N/A;   MYOMECTOMY     age 89   PELVIC ABCESS DRAINAGE     SHOULDER OPEN ROTATOR CUFF REPAIR Left 11/16/2014   Procedure: LEFT MINI OPEN ROTATOR CUFF REPAIR SHOULDER OPEN WITH SUBACROMIAL DECOMPRESSION;  Surgeon:  Susa Day, MD;  Location: [facility];  Service: Orthopedics;  Laterality: Left;    Allergies  Allergies  Allergen Reactions   Abatacept Hives   Other Hives and Diarrhea    Peppers    Tomato Diarrhea   Valium Other (See Comments)    Altered mental state   Etanercept Rash    EKGs/Labs/Other Studies Reviewed:   The following studies were reviewed today:  03/22/2021 echocardiogram IMPRESSIONS     1. Hypokinesis of the basal to mid inferior and inferolateral myocardium.  Left ventricular ejection fraction, by estimation, is 40 to 45%. The left  ventricle  has mildly decreased function. The left ventricle demonstrates  regional wall motion  abnormalities (see scoring diagram/findings for description). There is  mild left ventricular hypertrophy of the basal-septal segment. Left  ventricular diastolic parameters are consistent with Grade I diastolic  dysfunction (impaired relaxation).   2. Right ventricular systolic function is normal. The right ventricular  size is normal. There is normal pulmonary artery systolic pressure.   3. Left atrial size was mildly dilated.   4. The mitral valve is normal in structure. Trivial mitral valve  regurgitation. No evidence of mitral stenosis.   5. The aortic valve is tricuspid. Aortic valve regurgitation is mild to  moderate. No aortic stenosis is present. Aortic regurgitation PHT measures  797 msec.   6. The inferior vena cava is normal in size with greater than 50%  respiratory variability, suggesting right atrial pressure of 3 mmHg.   EKG:  EKG is  ordered today.  The ekg ordered today demonstrates NSR rate 60 bpm  Recent Labs: 03/05/2021: BUN 16; Creatinine, Ser 0.97; Potassium 4.4; Sodium 142  Recent Lipid Panel    Component Value Date/Time   CHOL 167 02/01/2020 0857   TRIG 187 (H) 02/01/2020 0857   HDL 58 02/01/2020 0857   CHOLHDL 2.9 02/01/2020 0857   CHOLHDL 3.5 01/30/2016 1128   VLDL 71 (H) 01/30/2016 1128   LDLCALC 78  02/01/2020 0857     Home Medications   Current Meds  Medication Sig   Acetaminophen-Caffeine (EXCEDRIN ASPIRIN FREE PO) Take 2 tablets by mouth daily as needed (headaches).    Artificial Tear Solution (SOOTHE XP) SOLN Place 1 drop into both eyes daily as needed (dry eyes).   b complex vitamins tablet Take 1 tablet by mouth daily.   Calcium Citrate-Vitamin D (CITRACAL + D PO) Take 1 tablet by mouth 2 (two) times daily.   clopidogrel (PLAVIX) 75 MG tablet Take 1 tablet (75 mg total) by mouth daily.   diltiazem (DILTIAZEM CD) 120 MG 24 hr capsule Take 1 capsule (120 mg total) by mouth daily.   diphenhydrAMINE (BENADRYL) 25 MG tablet Take 25 mg by mouth daily as needed for allergies.    escitalopram (LEXAPRO) 10 MG tablet Take 1 tablet (10 mg total) by mouth daily.   fluticasone (FLONASE) 50 MCG/ACT nasal spray Place 2 sprays into both nostrils at bedtime.   isosorbide mononitrate (IMDUR) 30 MG 24 hr tablet Take 0.5 tablets (15 mg total) by mouth at bedtime.   lisinopril (ZESTRIL) 20 MG tablet Take 1 tablet (20 mg total) by mouth daily.   metoprolol succinate (TOPROL-XL) 25 MG 24 hr tablet Take 1 tablet (25 mg total) by mouth daily.   montelukast (SINGULAIR) 10 MG tablet Take 10 mg by mouth at bedtime.   Multiple Vitamins-Minerals (CENTRUM SILVER PO) Take 1 tablet by mouth daily.    Multiple Vitamins-Minerals (PRESERVISION AREDS 2) CAPS Take 1 capsule by mouth 2 (two) times daily.   MYRBETRIQ 50 MG TB24 tablet Take 50 mg by mouth daily.   omeprazole (PRILOSEC) 20 MG capsule Take 1 capsule (20 mg total) by mouth daily.   predniSONE (STERAPRED UNI-PAK 48 TAB) 5 MG (48) TBPK tablet Take by mouth as directed.   Ranibizumab (LUCENTIS IO) Inject into the eye as directed. Eye injections every 8 - 10 weeks - unsure of dose   rosuvastatin (CRESTOR) 10 MG tablet Take 1 tablet (10 mg total) by mouth daily.     Review of Systems      All other systems  reviewed and are otherwise negative except as  noted above.  Physical Exam    VS:  BP 120/80 (BP Location: Left Arm, Patient Position: Sitting, Cuff Size: Normal)   Pulse 60   Ht '5\' 7"'$  (1.702 m)   Wt 169 lb 3.2 oz (76.7 kg)   LMP 02/18/1995   SpO2 97%   BMI 26.50 kg/m  , BMI Body mass index is 26.5 kg/m.  Wt Readings from Last 3 Encounters:  10/07/21 169 lb 3.2 oz (76.7 kg)  02/22/21 172 lb 9.6 oz (78.3 kg)  05/28/20 167 lb 3.2 oz (75.8 kg)     GEN: Well nourished, well developed, in no acute distress. HEENT: normal. Neck: Supple, no JVD, carotid bruits, or masses. Cardiac: RRR, 2 out of 6 systolic murmur heard best at the right upper sternal border, no rubs, or gallops. No clubbing, cyanosis, edema.  Radials/PT 2+ and equal bilaterally.  Respiratory:  Respirations regular and unlabored, clear to auscultation bilaterally. GI: Soft, nontender, nondistended. MS: No deformity or atrophy. Skin: Warm and dry, no rash. Neuro:  Strength and sensation are intact. Psych: Normal affect.  Assessment & Plan    Pre-op Clearance  Donna Bullock's perioperative risk of a major cardiac event is 6.6% according to the Revised Cardiac Risk Index (RCRI).  Therefore, she is at high risk for perioperative complications.   Her functional capacity is good at 6.55 METs according to the [place] Activity Status Index (DASI). Recommendations: According to ACC/AHA guidelines, no further cardiovascular testing needed.  The patient may proceed to surgery at acceptable risk.   Antiplatelet and/or Anticoagulation Recommendations: Okay to hold Plavix x7 days per Dr. Gasper Sells who is covering Dr. 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 -She cannot tolerate  spironolactone so she discontinued on her own -We discussed possibly starting Entresto and stopping her lisinopril but she wishes to discuss this with Dr. Johney Frame at her next appointment in November  Aortic valve insufficiency -echo yearly for monitoring, due Feb 2024  Hyperlipidemia -primary monitors -continue current medication regimen  Hypertension -Well-controlled today in the clinic -Continue current medication regimen  Dyspnea on exertion -this has improved -appears euvolemic on exam today      I will epic fax this to surgeon's office.   Disposition: Follow up 3 months with Freada Bergeron, MD or APP.  Signed, Elgie Collard, PA-C 10/07/2021, 12:27 PM [facility]

## 2021-10-07 ENCOUNTER — Encounter: Payer: Self-pay | Admitting: Physician Assistant

## 2021-10-07 ENCOUNTER — Ambulatory Visit (INDEPENDENT_AMBULATORY_CARE_PROVIDER_SITE_OTHER): Payer: Medicare Other | Admitting: Physician Assistant

## 2021-10-07 VITALS — BP 120/80 | HR 60 | Ht 67.0 in | Wt 169.2 lb

## 2021-10-07 DIAGNOSIS — M1990 Unspecified osteoarthritis, unspecified site: Secondary | ICD-10-CM | POA: Insufficient documentation

## 2021-10-07 DIAGNOSIS — I10 Essential (primary) hypertension: Secondary | ICD-10-CM

## 2021-10-07 DIAGNOSIS — M797 Fibromyalgia: Secondary | ICD-10-CM | POA: Insufficient documentation

## 2021-10-07 DIAGNOSIS — I201 Angina pectoris with documented spasm: Secondary | ICD-10-CM | POA: Diagnosis not present

## 2021-10-07 DIAGNOSIS — R0609 Other forms of dyspnea: Secondary | ICD-10-CM

## 2021-10-07 DIAGNOSIS — E785 Hyperlipidemia, unspecified: Secondary | ICD-10-CM

## 2021-10-07 DIAGNOSIS — M75100 Unspecified rotator cuff tear or rupture of unspecified shoulder, not specified as traumatic: Secondary | ICD-10-CM | POA: Insufficient documentation

## 2021-10-07 DIAGNOSIS — I351 Nonrheumatic aortic (valve) insufficiency: Secondary | ICD-10-CM

## 2021-10-07 DIAGNOSIS — G4730 Sleep apnea, unspecified: Secondary | ICD-10-CM | POA: Insufficient documentation

## 2021-10-07 DIAGNOSIS — Z8489 Family history of other specified conditions: Secondary | ICD-10-CM | POA: Insufficient documentation

## 2021-10-07 DIAGNOSIS — Z9109 Other allergy status, other than to drugs and biological substances: Secondary | ICD-10-CM | POA: Insufficient documentation

## 2021-10-07 DIAGNOSIS — R32 Unspecified urinary incontinence: Secondary | ICD-10-CM | POA: Insufficient documentation

## 2021-10-07 DIAGNOSIS — K219 Gastro-esophageal reflux disease without esophagitis: Secondary | ICD-10-CM | POA: Insufficient documentation

## 2021-10-07 DIAGNOSIS — I2542 Coronary artery dissection: Secondary | ICD-10-CM

## 2021-10-07 DIAGNOSIS — M810 Age-related osteoporosis without current pathological fracture: Secondary | ICD-10-CM | POA: Insufficient documentation

## 2021-10-07 DIAGNOSIS — D3500 Benign neoplasm of unspecified adrenal gland: Secondary | ICD-10-CM | POA: Insufficient documentation

## 2021-10-07 DIAGNOSIS — Z87442 Personal history of urinary calculi: Secondary | ICD-10-CM | POA: Insufficient documentation

## 2021-10-07 DIAGNOSIS — F32A Depression, unspecified: Secondary | ICD-10-CM | POA: Insufficient documentation

## 2021-10-07 DIAGNOSIS — Z79899 Other long term (current) drug therapy: Secondary | ICD-10-CM

## 2021-10-07 DIAGNOSIS — R011 Cardiac murmur, unspecified: Secondary | ICD-10-CM | POA: Insufficient documentation

## 2021-10-07 DIAGNOSIS — Z8711 Personal history of peptic ulcer disease: Secondary | ICD-10-CM | POA: Insufficient documentation

## 2021-10-07 DIAGNOSIS — M8580 Other specified disorders of bone density and structure, unspecified site: Secondary | ICD-10-CM | POA: Insufficient documentation

## 2021-10-07 NOTE — Patient Instructions (Signed)
Medication Instructions:  Your physician recommends that you continue on your current medications as directed. Please refer to the Current Medication list given to you today.  *If you need a refill on your cardiac medications before your next appointment, please call your pharmacy*   Lab Work: None If you have labs (blood work) drawn today and your tests are completely normal, you will receive your results only by: [place] (if you have MyChart) OR A paper copy in the mail If you have any lab test that is abnormal or we need to change your treatment, we will call you to review the results.  Follow-Up: At [facility], you and your health needs are our priority.  As part of our continuing mission to provide you with exceptional heart care, we have created designated Provider Care Teams.  These Care Teams include your primary Cardiologist (physician) and Advanced Practice Providers (APPs -  Physician Assistants and Nurse Practitioners) who all work together to provide you with the care you need, when you need it.    Your next appointment:   November  The format for your next appointment:   In Person  Provider:   Freada Bergeron, MD { Important Information About Sugar

## 2021-10-08 DIAGNOSIS — M1611 Unilateral primary osteoarthritis, right hip: Secondary | ICD-10-CM | POA: Insufficient documentation

## 2021-10-22 ENCOUNTER — Other Ambulatory Visit: Payer: Self-pay

## 2021-10-22 MED ORDER — DILTIAZEM HCL ER COATED BEADS 120 MG PO CP24: 120.0000 mg | ORAL_CAPSULE | Freq: Every day | ORAL | 3 refills | Status: DC

## 2021-10-22 MED ORDER — LISINOPRIL 20 MG PO TABS: 20.0000 mg | ORAL_TABLET | Freq: Every day | ORAL | 3 refills | Status: DC

## 2021-10-22 NOTE — Addendum Note (Signed)
Addended by: Carter Kitten D on: 10/22/2021 11:14 AM   Modules accepted: Orders

## 2021-10-23 ENCOUNTER — Other Ambulatory Visit: Payer: Self-pay

## 2021-10-23 MED ORDER — ESCITALOPRAM OXALATE 10 MG PO TABS: 10.0000 mg | ORAL_TABLET | Freq: Every day | ORAL | 3 refills | Status: DC

## 2021-10-23 MED ORDER — ROSUVASTATIN CALCIUM 10 MG PO TABS: 10.0000 mg | ORAL_TABLET | Freq: Every day | ORAL | 3 refills | Status: DC

## 2021-10-23 NOTE — Telephone Encounter (Signed)
[facility] mail order pharmacy is requesting a refill on escitalopram (Lexapro). Would Dr. Johney Frame like to refill this medication? Please address

## 2021-10-24 NOTE — Patient Instructions (Signed)
DUE TO COVID-19 ONLY TWO VISITORS  (aged 68 and older)  ARE ALLOWED TO COME WITH YOU AND STAY IN THE WAITING ROOM ONLY DURING PRE OP AND PROCEDURE.   **NO VISITORS ARE ALLOWED IN THE SHORT STAY AREA OR RECOVERY ROOM!!**  IF YOU WILL BE ADMITTED INTO THE HOSPITAL YOU ARE ALLOWED ONLY FOUR SUPPORT PEOPLE DURING VISITATION HOURS ONLY (7 AM -8PM)   The support person(s) must pass our screening, gel in and out, and wear a mask at all times, including in the patient's room. Patients must also wear a mask when staff or their support person are in the room. Visitors GUEST BADGE MUST BE WORN VISIBLY  One adult visitor may remain with you overnight and MUST be in the room by 8 P.M.     Your procedure is scheduled on: 11/05/21   Report to [facility] Main Entrance    Report to admitting at : 6:00 AM   Call this number if you have problems the morning of surgery [phone]   Do not eat food :After Midnight.   After Midnight you may have the following liquids until : 5:30 AM DAY OF SURGERY  Water Black Coffee (sugar ok, NO MILK/CREAM OR CREAMERS)  Tea (sugar ok, NO MILK/CREAM OR CREAMERS) regular and decaf                             Plain Jell-O (NO RED)                                           Fruit ices (not with fruit pulp, NO RED)                                     Popsicles (NO RED)                                                                  Juice: apple, WHITE grape, WHITE cranberry Sports drinks like Gatorade (NO RED)              Drink 2 Ensure/G2 drinks AT 10:00 PM the night before surgery.     The day of surgery:  Drink ONE (1) Pre-Surgery Clear Ensure or G2 at AM the morning of surgery. Drink in one sitting. Do not sip.  This drink was given to you during your hospital  pre-op appointment visit. Nothing else to drink after completing the  Pre-Surgery Clear Ensure or G2.          If you have questions, please contact your surgeon's office.    Oral Hygiene is  also important to reduce your risk of infection.                                    Remember - BRUSH YOUR TEETH THE MORNING OF SURGERY WITH YOUR REGULAR TOOTHPASTE   Do NOT smoke after Midnight   Take these medicines the morning of surgery with A  SIP OF WATER: isosorbide,escitalopram,metoprolol,diltiazem,omeprazole,myrbetriq.Flonase,eye drops as usual,Tylenol as needed.  DO NOT TAKE ANY ORAL DIABETIC MEDICATIONS DAY OF YOUR SURGERY  Bring CPAP mask and tubing day of surgery.                              You may not have any metal on your body including hair pins, jewelry, and body piercing             Do not wear make-up, lotions, powders, perfumes/cologne, or deodorant  Do not wear nail polish including gel and S&S, artificial/acrylic nails, or any other type of covering on natural nails including finger and toenails. If you have artificial nails, gel coating, etc. that needs to be removed by a nail salon please have this removed prior to surgery or surgery may need to be canceled/ delayed if the surgeon/ anesthesia feels like they are unable to be safely monitored.   Do not shave  48 hours prior to surgery.    Do not bring valuables to the hospital. [place].   Contacts, dentures or bridgework may not be worn into surgery.   Bring small overnight bag day of surgery.   DO NOT [place]. PHARMACY WILL DISPENSE MEDICATIONS LISTED ON YOUR MEDICATION LIST TO YOU DURING YOUR ADMISSION [place]!    Patients discharged on the day of surgery will not be allowed to drive home.  Someone NEEDS to stay with you for the first 24 hours after anesthesia.   Special Instructions: Bring a copy of your healthcare power of attorney and living will documents         the day of surgery if you haven't scanned them before.              Please read over the following fact sheets you were given: IF YOU HAVE QUESTIONS  ABOUT YOUR PRE-OP INSTRUCTIONS PLEASE CALL [phone]     [facility] - Preparing for Surgery Before surgery, you can play an important role.  Because skin is not sterile, your skin needs to be as free of germs as possible.  You can reduce the number of germs on your skin by washing with CHG (chlorahexidine gluconate) soap before surgery.  CHG is an antiseptic cleaner which kills germs and bonds with the skin to continue killing germs even after washing. Please DO NOT use if you have an allergy to CHG or antibacterial soaps.  If your skin becomes reddened/irritated stop using the CHG and inform your nurse when you arrive at Short Stay. Do not shave (including legs and underarms) for at least 48 hours prior to the first CHG shower.  You may shave your face/neck. Please follow these instructions carefully:  1.  Shower with CHG Soap the night before surgery and the  morning of Surgery.  2.  If you choose to wash your hair, wash your hair first as usual with your  normal  shampoo.  3.  After you shampoo, rinse your hair and body thoroughly to remove the  shampoo.                           4.  Use CHG as you would any other liquid soap.  You can apply chg directly  to the skin  and wash                       Gently with a scrungie or clean washcloth.  5.  Apply the CHG Soap to your body ONLY FROM THE NECK DOWN.   Do not use on face/ open                           Wound or open sores. Avoid contact with eyes, ears mouth and genitals (private parts).                       Wash face,  Genitals (private parts) with your normal soap.             6.  Wash thoroughly, paying special attention to the area where your surgery  will be performed.  7.  Thoroughly rinse your body with warm water from the neck down.  8.  DO NOT shower/wash with your normal soap after using and rinsing off  the CHG Soap.                9.  Pat yourself dry with a clean towel.            10.  Wear clean pajamas.            11.  Place  clean sheets on your bed the night of your first shower and do not  sleep with pets. Day of Surgery : Do not apply any lotions/deodorants the morning of surgery.  Please wear clean clothes to the hospital/surgery center.  FAILURE TO FOLLOW THESE INSTRUCTIONS MAY RESULT IN THE CANCELLATION OF YOUR SURGERY PATIENT SIGNATURE_________________________________  NURSE SIGNATURE__________________________________  ________________________________________________________________________   Donna Bullock  An incentive spirometer is a tool that can help keep your lungs clear and active. This tool measures how well you are filling your lungs with each breath. Taking long deep breaths may help reverse or decrease the chance of developing breathing (pulmonary) problems (especially infection) following: A long period of time when you are unable to move or be active. BEFORE THE PROCEDURE  If the spirometer includes an indicator to show your best effort, your nurse or respiratory therapist will set it to a desired goal. If possible, sit up straight or lean slightly forward. Try not to slouch. Hold the incentive spirometer in an upright position. INSTRUCTIONS FOR USE  Sit on the edge of your bed if possible, or sit up as far as you can in bed or on a chair. Hold the incentive spirometer in an upright position. Breathe out normally. Place the mouthpiece in your mouth and seal your lips tightly around it. Breathe in slowly and as deeply as possible, raising the piston or the ball toward the top of the column. Hold your breath for 3-5 seconds or for as long as possible. Allow the piston or ball to fall to the bottom of the column. Remove the mouthpiece from your mouth and breathe out normally. Rest for a few seconds and repeat Steps 1 through 7 at least 10 times every 1-2 hours when you are awake. Take your time and take a few normal breaths between deep breaths. The spirometer may include an indicator  to show your best effort. Use the indicator as a goal to work toward during each repetition. After each set of 10 deep breaths, practice coughing to be sure your lungs are clear. If you  have an incision (the cut made at the time of surgery), support your incision when coughing by placing a pillow or rolled up towels firmly against it. Once you are able to get out of bed, walk around indoors and cough well. You may stop using the incentive spirometer when instructed by your caregiver.  RISKS AND COMPLICATIONS Take your time so you do not get dizzy or light-headed. If you are in pain, you may need to take or ask for pain medication before doing incentive spirometry. It is harder to take a deep breath if you are having pain. AFTER USE Rest and breathe slowly and easily. It can be helpful to keep track of a log of your progress. Your caregiver can provide you with a simple table to help with this. If you are using the spirometer at home, follow these instructions: [place] IF:  You are having difficultly using the spirometer. You have trouble using the spirometer as often as instructed. Your pain medication is not giving enough relief while using the spirometer. You develop fever of 100.5 F (38.1 C) or higher. SEEK IMMEDIATE MEDICAL CARE IF:  You cough up bloody sputum that had not been present before. You develop fever of 102 F (38.9 C) or greater. You develop worsening pain at or near the incision site. MAKE SURE YOU:  Understand these instructions. Will watch your condition. Will get help right away if you are not doing well or get worse. Document Released: 06/16/2006 Document Revised: 04/28/2011 Document Reviewed: 08/17/2006 [facility] Patient Information 2014 [place], [place].   ________________________________________________________________________

## 2021-10-25 ENCOUNTER — Encounter (HOSPITAL_COMMUNITY)
Admission: RE | Admit: 2021-10-25 | Discharge: 2021-10-25 | Disposition: A | Discharge status: Home / Self Care | Payer: Medicare Other | Source: Ambulatory Visit | Attending: Orthopedic Surgery | Admitting: Orthopedic Surgery

## 2021-10-25 ENCOUNTER — Other Ambulatory Visit: Payer: Self-pay

## 2021-10-25 ENCOUNTER — Encounter (HOSPITAL_COMMUNITY): Payer: Self-pay

## 2021-10-25 VITALS — BP 140/81 | HR 56 | Temp 97.7°F | Ht 67.0 in | Wt 169.0 lb

## 2021-10-25 DIAGNOSIS — Z01812 Encounter for preprocedural laboratory examination: Secondary | ICD-10-CM | POA: Diagnosis present

## 2021-10-25 DIAGNOSIS — G4730 Sleep apnea, unspecified: Secondary | ICD-10-CM | POA: Diagnosis not present

## 2021-10-25 DIAGNOSIS — I10 Essential (primary) hypertension: Secondary | ICD-10-CM | POA: Insufficient documentation

## 2021-10-25 DIAGNOSIS — Z87891 Personal history of nicotine dependence: Secondary | ICD-10-CM | POA: Insufficient documentation

## 2021-10-25 DIAGNOSIS — Z01818 Encounter for other preprocedural examination: Secondary | ICD-10-CM

## 2021-10-25 DIAGNOSIS — M1611 Unilateral primary osteoarthritis, right hip: Secondary | ICD-10-CM | POA: Insufficient documentation

## 2021-10-25 DIAGNOSIS — I2510 Atherosclerotic heart disease of native coronary artery without angina pectoris: Secondary | ICD-10-CM | POA: Diagnosis not present

## 2021-10-25 LAB — BASIC METABOLIC PANEL
Anion gap: 5 (ref 5–15)
BUN: 13 mg/dL (ref 8–23)
CO2: 27 mmol/L (ref 22–32)
Calcium: 9.7 mg/dL (ref 8.9–10.3)
Chloride: 109 mmol/L (ref 98–111)
Creatinine, Ser: 0.82 mg/dL (ref 0.44–1.00)
GFR, Estimated: >60 mL/min (ref >60)
Glucose, Bld: 97 mg/dL (ref 70–99)
Potassium: 3.8 mmol/L (ref 3.5–5.1)
Sodium: 141 mmol/L (ref 135–145)

## 2021-10-25 LAB — SURGICAL PCR SCREEN
MRSA, PCR: NEGATIVE
Staphylococcus aureus: NEGATIVE

## 2021-10-25 LAB — CBC
HCT: 44.5 % (ref 36.0–46.0)
Hemoglobin: 14.3 g/dL (ref 12.0–15.0)
MCH: 30.4 pg (ref 26.0–34.0)
MCHC: 32.1 g/dL (ref 30.0–36.0)
MCV: 94.7 fL (ref 80.0–100.0)
Platelets: 249 10*3/uL (ref 150–400)
RBC: 4.7 MIL/uL (ref 3.87–5.11)
RDW: 14.6 % (ref 11.5–15.5)
WBC: 6.9 10*3/uL (ref 4.0–10.5)
nRBC: 0 % (ref 0.0–0.2)

## 2021-10-25 LAB — TYPE AND SCREEN
ABO/RH(D): O POS
Antibody Screen: NEGATIVE

## 2021-10-25 NOTE — Progress Notes (Signed)
For Short Stay: [place] appointment date: Date of COVID positive in last 57 days:  Bowel Prep reminder:   For Anesthesia: PCP - Dr. Yehuda Mao Cardiologist - Dr. Gwyndolyn Kaufman. LOV: 10/07/21  Chest x-ray -  EKG - 10/07/21 Stress Test -  ECHO - 03/22/21 Cardiac Cath -  Pacemaker/ICD device last checked: Pacemaker orders received: Device Rep notified:  Spinal Cord Stimulator:  Sleep Study - Yes CPAP - Yes  Fasting Blood Sugar -  Checks Blood Sugar _____ times a day Date and result of last Hgb A1c-  Blood Thinner Instructions: Plavix will be held 5 days before surgery: Dr. Alvan Dame. Aspirin Instructions: Last Dose:  Activity level: Can go up a flight of stairs and activities of daily living without stopping and without chest pain and/or shortness of breath   Able to exercise without chest pain and/or shortness of breath   Unable to go up a flight of stairs without chest pain and/or shortness of breath     Anesthesia review: Hx: HTN,CAD,Heart murmur,MI,OSA(CPAP).  Patient denies shortness of breath, fever, cough and chest pain at PAT appointment   Patient verbalized understanding of instructions that were given to them at the PAT appointment. Patient was also instructed that they will need to review over the PAT instructions again at home before surgery.

## 2021-10-28 NOTE — Anesthesia Preprocedure Evaluation (Deleted)
Anesthesia Evaluation  Patient identified by MRN, date of birth, ID band Patient awake    Reviewed: Allergy & Precautions, NPO status , Patient's Chart, lab work & pertinent test results  Airway Mallampati: II  TM Distance: >3 FB Neck ROM: Full    Dental no notable dental hx.    Pulmonary neg pulmonary ROS, sleep apnea , Patient abstained from smoking., former smoker,    Pulmonary exam normal breath sounds clear to auscultation       Cardiovascular hypertension, Pt. on medications + CAD and + Past MI  negative cardio ROS Normal cardiovascular exam Rhythm:Regular Rate:Normal     Neuro/Psych Depression negative neurological ROS  negative psych ROS   GI/Hepatic negative GI ROS, Neg liver ROS, GERD  ,  Endo/Other  negative endocrine ROS  Renal/GU negative Renal ROS  negative genitourinary   Musculoskeletal negative musculoskeletal ROS (+) Arthritis , Osteoarthritis,  Fibromyalgia -  Abdominal   Peds negative pediatric ROS (+)  Hematology negative hematology ROS (+)   Anesthesia Other Findings   Reproductive/Obstetrics negative OB ROS                                                             Anesthesia Evaluation  Patient identified by MRN, date of birth, ID band Patient awake    Reviewed: Allergy & Precautions, NPO status , Patient's Chart, lab work & pertinent test results  Airway Mallampati: II  TM Distance: >3 FB Neck ROM: Full    Dental  (+) Teeth Intact, Dental Advisory Given   Pulmonary sleep apnea and Continuous Positive Airway Pressure Ventilation , Current Smoker and Patient abstained from smoking.,    breath sounds clear to auscultation       Cardiovascular hypertension, Pt. on medications and Pt. on home beta blockers + CAD, + Past MI and + Cardiac Stents   Rhythm:Regular Rate:Normal  10/05/16 Echo  Left ventricle: The cavity size was mildly dilated.  Systolic  function was normal. The estimated ejection fraction was in the  range of 55% to 60%. Wall motion was normal; there were no  regional wall motion abnormalities. Features are consistent with  a pseudonormal left ventricular filling pattern, with concomitant  abnormal relaxation and increased filling pressure (grade 2  diastolic dysfunction). Doppler parameters are consistent with  high ventricular filling pressure.  - Aortic valve: There was mild regurgitation.    Neuro/Psych PSYCHIATRIC DISORDERS Depression  Neuromuscular disease    GI/Hepatic Neg liver ROS, GERD  Medicated,  Endo/Other  negative endocrine ROS  Renal/GU negative Renal ROS     Musculoskeletal  (+) Arthritis , Fibromyalgia -  Abdominal Normal abdominal exam  (+)   Peds  Hematology negative hematology ROS (+) Lab Results      Component                Value               Date                      WBC                      8.4                 08/03/2019  HGB                      14.4                08/03/2019                HCT                      41.9                08/03/2019                MCV                      87                  08/03/2019                PLT                      244                 08/03/2019              Anesthesia Other Findings   Reproductive/Obstetrics                           Anesthesia Physical Anesthesia Plan  ASA: III  Anesthesia Plan: General   Post-op Pain Management:    Induction: Intravenous  PONV Risk Score and Plan: 3 and Treatment may vary due to age or medical condition, Ondansetron, Dexamethasone and Midazolam  Airway Management Planned: LMA  Additional Equipment: None  Intra-op Plan:   Post-operative Plan: Extubation in OR  Informed Consent: I have reviewed the patients History and Physical, chart, labs and discussed the procedure including the risks, benefits and alternatives for the  proposed anesthesia with the patient or authorized representative who has indicated his/her understanding and acceptance.     Dental advisory given  Plan Discussed with: CRNA  Anesthesia Plan Comments:       Anesthesia Quick Evaluation  Anesthesia Physical Anesthesia Plan  ASA: 3  Anesthesia Plan: Spinal   Post-op Pain Management: Regional block* and Minimal or no pain anticipated   Induction: Intravenous  PONV Risk Score and Plan: 2 and Ondansetron, Midazolam and Treatment may vary due to age or medical condition  Airway Management Planned: Simple Face Mask  Additional Equipment:   Intra-op Plan:   Post-operative Plan:   Informed Consent: I have reviewed the patients History and Physical, chart, labs and discussed the procedure including the risks, benefits and alternatives for the proposed anesthesia with the patient or authorized representative who has indicated his/her understanding and acceptance.     Dental advisory given  Plan Discussed with: CRNA  Anesthesia Plan Comments: (See PAT note 10/25/2021)       Anesthesia Quick Evaluation

## 2021-10-28 NOTE — Progress Notes (Signed)
Anesthesia Chart Review   Case: [id number] Date/Time: 11/05/21 0825   Procedure: TOTAL HIP ARTHROPLASTY ANTERIOR APPROACH (Right: Hip)   Anesthesia type: Spinal   Pre-op diagnosis: Right hip osteoarthritis   Location: [facility] ROOM 10 / [facility]   Surgeons: Paralee Cancel, MD       DISCUSSION:66 y.o. former smoker with h/o HTN, CAD (bare metal stent 1996, DES 2003), sleep apnea, right hip OA scheduled for above procedure 11/05/2021 with Dr. Paralee Cancel.   Pt advised to hold Plavix 5 days prior to procedure.   Pt last seen by cardiology 10/07/2021. Per OV note, "[name]'s perioperative risk of a major cardiac event is 6.6% according to the Revised Cardiac Risk Index (RCRI).  Therefore, she is at high risk for perioperative complications.   Her functional capacity is good at 6.55 METs according to the Duke Activity Status Index (DASI). Recommendations: According to ACC/AHA guidelines, no further cardiovascular testing needed.  The patient may proceed to surgery at acceptable risk.   Antiplatelet and/or Anticoagulation Recommendations: Okay to hold Plavix x7 days per Dr. Gasper Bullock who is covering Dr. Johney Bullock"  Anticipate pt can proceed with planned procedure barring acute status change.   VS: BP (!) 140/81   Pulse (!) 56   Temp 36.5 C (Oral)   Ht '5\' 7"'$  (1.702 m)   Wt 76.7 kg   LMP 02/18/1995   SpO2 97%   BMI 26.47 kg/m   PROVIDERS: Jani Gravel, MD is PCP   Cardiologist - Dr. Gwyndolyn Bullock LABS: Labs reviewed: Acceptable for surgery. (all labs ordered are listed, but only abnormal results are displayed)  Labs Reviewed  SURGICAL PCR SCREEN  BASIC METABOLIC PANEL  CBC  TYPE AND SCREEN     IMAGES:   EKG:   CV: Echo 03/22/2021  1. Hypokinesis of the basal to mid inferior and inferolateral myocardium.  Left ventricular ejection fraction, by estimation, is 40 to 45%. The left  ventricle has mildly decreased function. The left ventricle demonstrates  regional wall motion   abnormalities (see scoring diagram/findings for description). There is  mild left ventricular hypertrophy of the basal-septal segment. Left  ventricular diastolic parameters are consistent with Grade I diastolic  dysfunction (impaired relaxation).   2. Right ventricular systolic function is normal. The right ventricular  size is normal. There is normal pulmonary artery systolic pressure.   3. Left atrial size was mildly dilated.   4. The mitral valve is normal in structure. Trivial mitral valve  regurgitation. No evidence of mitral stenosis.   5. The aortic valve is tricuspid. Aortic valve regurgitation is mild to  moderate. No aortic stenosis is present. Aortic regurgitation PHT measures  797 msec.   6. The inferior vena cava is normal in size with greater than 50%  respiratory variability, suggesting right atrial pressure of 3 mmHg.  Past Medical History:  Diagnosis Date   Arthritis    rheumatoid arthritis, and osteoarthritis    Benign tumor of adrenal gland    CAD (coronary artery disease)    Inferior MI 1996 tx with bare metal stent RCA. Repeat  anterior MI 2002 with LAD vasospasm. Repeat inferior MI 2003 with occlusion RCA with overlapping Cypher DES in RCA. Repeat cath 2003 with profound vasospasm LAd and Circumflex. Repeat cath 2007  with patent RCA and no disease in LAD or Circumflex.   Depression    Environmental allergies    Family history of adverse reaction to anesthesia    mother had ? problem with anesthesia -  was alcoholic - "mind was never right again"   Family history of breast cancer    Family history of leukemia    Family history of stomach cancer    Fibromyalgia    GERD (gastroesophageal reflux disease)    Heart murmur    History of kidney stones    History of stomach ulcers    due to taking aspirin for headaches   HTN (hypertension)    Hyperlipidemia    Myocardial infarction ([place])    times 4 "very minor" stents x2    Osteopenia    Osteoporosis    Rotator  cuff tear    left   Sleep apnea    uses c pap - does not know settings    Urinary incontinence     Past Surgical History:  Procedure Laterality Date   ABDOMINAL HYSTERECTOMY     BACK SURGERY     CYSTOSCOPY WITH URETEROSCOPY AND STENT PLACEMENT Left 08/24/2019   Procedure: CYSTOSCOPY WITH DIAGNOSTIC URETEROSCOPY , REMOVAL OF STONES AND LEFT  STENT PLACEMENT;  Surgeon: Donna Hughs, MD;  Location: [facility];  Service: Urology;  Laterality: Left;   LAPAROSCOPIC LYSIS OF ADHESIONS     LEFT HEART CATH AND CORONARY ANGIOGRAPHY N/A 11/16/2017   Procedure: LEFT HEART CATH AND CORONARY ANGIOGRAPHY;  Surgeon: Donna Crome, MD;  Location: [facility];  Service: Cardiovascular;  Laterality: N/A;   MYOMECTOMY     age 17   PELVIC ABCESS DRAINAGE     SHOULDER OPEN ROTATOR CUFF REPAIR Left 11/16/2014   Procedure: LEFT MINI OPEN ROTATOR CUFF REPAIR SHOULDER OPEN WITH SUBACROMIAL DECOMPRESSION;  Surgeon: Donna Day, MD;  Location: [facility];  Service: Orthopedics;  Laterality: Left;    MEDICATIONS:  acetaminophen (TYLENOL) 325 MG tablet   Artificial Tear Solution (SOOTHE XP) SOLN   b complex vitamins tablet   Calcium Citrate-Vitamin D (CITRACAL + D PO)   clopidogrel (PLAVIX) 75 MG tablet   diltiazem (DILTIAZEM CD) 120 MG 24 hr capsule   diphenhydrAMINE (BENADRYL) 25 MG tablet   escitalopram (LEXAPRO) 10 MG tablet   fluticasone (FLONASE) 50 MCG/ACT nasal spray   golimumab (SIMPONI ARIA) 50 MG/4ML SOLN injection   isosorbide mononitrate (IMDUR) 30 MG 24 hr tablet   lisinopril (ZESTRIL) 20 MG tablet   metoprolol succinate (TOPROL-XL) 25 MG 24 hr tablet   montelukast (SINGULAIR) 10 MG tablet   Multiple Vitamins-Minerals (CENTRUM SILVER PO)   Multiple Vitamins-Minerals (PRESERVISION AREDS 2) CAPS   MYRBETRIQ 50 MG TB24 tablet   omeprazole (PRILOSEC) 20 MG capsule   Ranibizumab (LUCENTIS IO)   rosuvastatin (CRESTOR) 10 MG tablet   valACYclovir (VALTREX) 500 MG tablet   No current  facility-administered medications for this encounter.    Donna Felix Ward, PA-C [facility] [phone]

## 2021-11-04 ENCOUNTER — Encounter (HOSPITAL_COMMUNITY): Payer: Self-pay | Admitting: Orthopedic Surgery

## 2021-11-05 ENCOUNTER — Other Ambulatory Visit: Payer: Self-pay

## 2021-11-05 ENCOUNTER — Ambulatory Visit (HOSPITAL_COMMUNITY): Payer: Medicare Other

## 2021-11-05 ENCOUNTER — Observation Stay (HOSPITAL_COMMUNITY)
Admission: RE | Admit: 2021-11-05 | Discharge: 2021-11-06 | Disposition: A | Discharge status: Home / Self Care | Payer: Medicare Other | Source: Ambulatory Visit | Attending: Orthopedic Surgery | Admitting: Orthopedic Surgery

## 2021-11-05 ENCOUNTER — Ambulatory Visit (HOSPITAL_BASED_OUTPATIENT_CLINIC_OR_DEPARTMENT_OTHER): Payer: Medicare Other | Admitting: Anesthesiology

## 2021-11-05 ENCOUNTER — Observation Stay (HOSPITAL_COMMUNITY): Payer: Medicare Other

## 2021-11-05 ENCOUNTER — Encounter (HOSPITAL_COMMUNITY): Payer: Self-pay | Admitting: Orthopedic Surgery

## 2021-11-05 ENCOUNTER — Ambulatory Visit (HOSPITAL_COMMUNITY): Payer: Medicare Other | Admitting: Physician Assistant

## 2021-11-05 ENCOUNTER — Encounter (HOSPITAL_COMMUNITY)
Admission: RE | Disposition: A | Discharge status: Home / Self Care | Payer: Self-pay | Source: Ambulatory Visit | Attending: Orthopedic Surgery

## 2021-11-05 DIAGNOSIS — M1611 Unilateral primary osteoarthritis, right hip: Principal | ICD-10-CM | POA: Insufficient documentation

## 2021-11-05 DIAGNOSIS — I2510 Atherosclerotic heart disease of native coronary artery without angina pectoris: Secondary | ICD-10-CM | POA: Diagnosis not present

## 2021-11-05 DIAGNOSIS — G4730 Sleep apnea, unspecified: Secondary | ICD-10-CM | POA: Insufficient documentation

## 2021-11-05 DIAGNOSIS — Z87891 Personal history of nicotine dependence: Secondary | ICD-10-CM | POA: Insufficient documentation

## 2021-11-05 DIAGNOSIS — I10 Essential (primary) hypertension: Secondary | ICD-10-CM | POA: Insufficient documentation

## 2021-11-05 DIAGNOSIS — Z96641 Presence of right artificial hip joint: Secondary | ICD-10-CM

## 2021-11-05 HISTORY — PX: TOTAL HIP ARTHROPLASTY: SHX124

## 2021-11-05 SURGERY — ARTHROPLASTY, HIP, TOTAL, ANTERIOR APPROACH
Anesthesia: Spinal | Site: Hip | Laterality: Right

## 2021-11-05 MED ORDER — HYDROMORPHONE HCL 1 MG/ML IJ SOLN: 0.2500 mg | INTRAMUSCULAR | Status: DC | PRN

## 2021-11-05 MED ORDER — OXYCODONE HCL 5 MG PO TABS: 5.0000 mg | ORAL_TABLET | Freq: Once | ORAL | Status: DC | PRN

## 2021-11-05 MED ORDER — ORAL CARE MOUTH RINSE: 15.0000 mL | Freq: Once | OROMUCOSAL | Status: AC

## 2021-11-05 MED ORDER — PROPOFOL 10 MG/ML IV BOLUS
INTRAVENOUS | Status: AC
  Filled 2021-11-05: qty 20

## 2021-11-05 MED ORDER — METHOCARBAMOL 500 MG PO TABS
500.0000 mg | ORAL_TABLET | Freq: Four times a day (QID) | ORAL | Status: DC | PRN
  Administered 2021-11-05 – 2021-11-06 (×4): 500 mg via ORAL
  Filled 2021-11-05 (×4): qty 1

## 2021-11-05 MED ORDER — 0.9 % SODIUM CHLORIDE (POUR BTL) OPTIME
TOPICAL | Status: DC | PRN
  Administered 2021-11-05: 1000 mL

## 2021-11-05 MED ORDER — LIDOCAINE 2% (20 MG/ML) 5 ML SYRINGE
INTRAMUSCULAR | Status: DC | PRN
  Administered 2021-11-05: 50 mg via INTRAVENOUS

## 2021-11-05 MED ORDER — CHLORHEXIDINE GLUCONATE 0.12 % MT SOLN
15.0000 mL | Freq: Once | OROMUCOSAL | Status: AC
  Administered 2021-11-05: 15 mL via OROMUCOSAL

## 2021-11-05 MED ORDER — TRANEXAMIC ACID-NACL 1000-0.7 MG/100ML-% IV SOLN
1000.0000 mg | INTRAVENOUS | Status: AC
Start: 2021-11-05 — End: 2021-11-05
  Administered 2021-11-05: 1000 mg via INTRAVENOUS
  Filled 2021-11-05: qty 100

## 2021-11-05 MED ORDER — METHOCARBAMOL 1000 MG/10ML IJ SOLN: 500.0000 mg | Freq: Four times a day (QID) | INTRAVENOUS | Status: DC | PRN

## 2021-11-05 MED ORDER — SODIUM CHLORIDE 0.9 % IV SOLN: INTRAVENOUS | Status: DC

## 2021-11-05 MED ORDER — CEFAZOLIN SODIUM-DEXTROSE 2-4 GM/100ML-% IV SOLN
2.0000 g | INTRAVENOUS | Status: AC
Start: 2021-11-05 — End: 2021-11-05
  Administered 2021-11-05: 2 g via INTRAVENOUS
  Filled 2021-11-05: qty 100

## 2021-11-05 MED ORDER — POLYETHYLENE GLYCOL 3350 17 G PO PACK: 17.0000 g | PACK | Freq: Every day | ORAL | Status: DC | PRN

## 2021-11-05 MED ORDER — METOCLOPRAMIDE HCL 5 MG PO TABS: 5.0000 mg | ORAL_TABLET | Freq: Three times a day (TID) | ORAL | Status: DC | PRN

## 2021-11-05 MED ORDER — DEXAMETHASONE SODIUM PHOSPHATE 10 MG/ML IJ SOLN
INTRAMUSCULAR | Status: DC | PRN
  Administered 2021-11-05: 4 mg via INTRAVENOUS

## 2021-11-05 MED ORDER — PROPOFOL 1000 MG/100ML IV EMUL
INTRAVENOUS | Status: AC
  Filled 2021-11-05: qty 100

## 2021-11-05 MED ORDER — ACETAMINOPHEN 325 MG PO TABS: 325.0000 mg | ORAL_TABLET | Freq: Four times a day (QID) | ORAL | Status: DC | PRN

## 2021-11-05 MED ORDER — PROPOFOL 10 MG/ML IV BOLUS
INTRAVENOUS | Status: DC | PRN
  Administered 2021-11-05: 20 mg via INTRAVENOUS

## 2021-11-05 MED ORDER — FENTANYL CITRATE (PF) 100 MCG/2ML IJ SOLN
INTRAMUSCULAR | Status: DC | PRN
  Administered 2021-11-05: 50 ug via INTRAVENOUS

## 2021-11-05 MED ORDER — DEXAMETHASONE SODIUM PHOSPHATE 10 MG/ML IJ SOLN
10.0000 mg | Freq: Once | INTRAMUSCULAR | Status: AC
  Administered 2021-11-06: 10 mg via INTRAVENOUS
  Filled 2021-11-05: qty 1

## 2021-11-05 MED ORDER — LIDOCAINE HCL (PF) 2 % IJ SOLN
INTRAMUSCULAR | Status: AC
  Filled 2021-11-05: qty 10

## 2021-11-05 MED ORDER — TRANEXAMIC ACID-NACL 1000-0.7 MG/100ML-% IV SOLN
1000.0000 mg | Freq: Once | INTRAVENOUS | Status: AC
  Administered 2021-11-05: 1000 mg via INTRAVENOUS
  Filled 2021-11-05: qty 100

## 2021-11-05 MED ORDER — LACTATED RINGERS IV SOLN: INTRAVENOUS | Status: DC

## 2021-11-05 MED ORDER — OXYCODONE HCL 5 MG/5ML PO SOLN: 5.0000 mg | Freq: Once | ORAL | Status: DC | PRN

## 2021-11-05 MED ORDER — DEXAMETHASONE SODIUM PHOSPHATE 10 MG/ML IJ SOLN
INTRAMUSCULAR | Status: AC
  Filled 2021-11-05: qty 2

## 2021-11-05 MED ORDER — ONDANSETRON HCL 4 MG/2ML IJ SOLN
INTRAMUSCULAR | Status: AC
  Filled 2021-11-05: qty 4

## 2021-11-05 MED ORDER — MENTHOL 3 MG MT LOZG: 1.0000 | LOZENGE | OROMUCOSAL | Status: DC | PRN

## 2021-11-05 MED ORDER — PROMETHAZINE HCL 25 MG/ML IJ SOLN: 6.2500 mg | INTRAMUSCULAR | Status: DC | PRN

## 2021-11-05 MED ORDER — BISACODYL 10 MG RE SUPP: 10.0000 mg | Freq: Every day | RECTAL | Status: DC | PRN

## 2021-11-05 MED ORDER — MIDAZOLAM HCL 5 MG/5ML IJ SOLN
INTRAMUSCULAR | Status: DC | PRN
  Administered 2021-11-05: 2 mg via INTRAVENOUS

## 2021-11-05 MED ORDER — METOCLOPRAMIDE HCL 5 MG/ML IJ SOLN: 5.0000 mg | Freq: Three times a day (TID) | INTRAMUSCULAR | Status: DC | PRN

## 2021-11-05 MED ORDER — PHENYLEPHRINE HCL-NACL 20-0.9 MG/250ML-% IV SOLN
INTRAVENOUS | Status: DC | PRN
  Administered 2021-11-05: 15 ug/min via INTRAVENOUS

## 2021-11-05 MED ORDER — FLUTICASONE PROPIONATE 50 MCG/ACT NA SUSP
2.0000 | Freq: Every day | NASAL | Status: DC
  Administered 2021-11-05: 2 via NASAL
  Filled 2021-11-05 (×2): qty 16

## 2021-11-05 MED ORDER — ONDANSETRON HCL 4 MG/2ML IJ SOLN
INTRAMUSCULAR | Status: DC | PRN
  Administered 2021-11-05: 4 mg via INTRAVENOUS

## 2021-11-05 MED ORDER — DOCUSATE SODIUM 100 MG PO CAPS
100.0000 mg | ORAL_CAPSULE | Freq: Two times a day (BID) | ORAL | Status: DC
  Administered 2021-11-05 – 2021-11-06 (×2): 100 mg via ORAL
  Filled 2021-11-05 (×2): qty 1

## 2021-11-05 MED ORDER — PHENOL 1.4 % MT LIQD: 1.0000 | OROMUCOSAL | Status: DC | PRN

## 2021-11-05 MED ORDER — ESCITALOPRAM OXALATE 10 MG PO TABS
10.0000 mg | ORAL_TABLET | Freq: Every day | ORAL | Status: DC
  Administered 2021-11-06: 10 mg via ORAL
  Filled 2021-11-05: qty 1

## 2021-11-05 MED ORDER — MONTELUKAST SODIUM 10 MG PO TABS
10.0000 mg | ORAL_TABLET | Freq: Every day | ORAL | Status: DC
  Administered 2021-11-05: 10 mg via ORAL
  Filled 2021-11-05: qty 1

## 2021-11-05 MED ORDER — FERROUS SULFATE 325 (65 FE) MG PO TABS
325.0000 mg | ORAL_TABLET | Freq: Three times a day (TID) | ORAL | Status: DC
  Administered 2021-11-05 – 2021-11-06 (×2): 325 mg via ORAL
  Filled 2021-11-05 (×2): qty 1

## 2021-11-05 MED ORDER — ISOSORBIDE MONONITRATE ER 30 MG PO TB24: 15.0000 mg | ORAL_TABLET | Freq: Every day | ORAL | Status: DC

## 2021-11-05 MED ORDER — CLOPIDOGREL BISULFATE 75 MG PO TABS
75.0000 mg | ORAL_TABLET | Freq: Every day | ORAL | Status: DC
  Administered 2021-11-06: 75 mg via ORAL
  Filled 2021-11-05: qty 1

## 2021-11-05 MED ORDER — DILTIAZEM HCL ER COATED BEADS 120 MG PO CP24
120.0000 mg | ORAL_CAPSULE | Freq: Every day | ORAL | Status: DC
  Administered 2021-11-06: 120 mg via ORAL
  Filled 2021-11-05: qty 1

## 2021-11-05 MED ORDER — LISINOPRIL 20 MG PO TABS: 20.0000 mg | ORAL_TABLET | Freq: Every day | ORAL | Status: DC

## 2021-11-05 MED ORDER — ROSUVASTATIN CALCIUM 10 MG PO TABS
10.0000 mg | ORAL_TABLET | Freq: Every day | ORAL | Status: DC
  Administered 2021-11-05: 10 mg via ORAL
  Filled 2021-11-05 (×2): qty 1

## 2021-11-05 MED ORDER — FENTANYL CITRATE (PF) 100 MCG/2ML IJ SOLN
INTRAMUSCULAR | Status: AC
  Filled 2021-11-05: qty 2

## 2021-11-05 MED ORDER — MIDAZOLAM HCL 2 MG/2ML IJ SOLN
INTRAMUSCULAR | Status: AC
  Filled 2021-11-05: qty 2

## 2021-11-05 MED ORDER — MIRABEGRON ER 25 MG PO TB24
50.0000 mg | ORAL_TABLET | Freq: Every day | ORAL | Status: DC
  Administered 2021-11-06: 50 mg via ORAL
  Filled 2021-11-05: qty 2

## 2021-11-05 MED ORDER — HYDROCODONE-ACETAMINOPHEN 7.5-325 MG PO TABS
1.0000 | ORAL_TABLET | ORAL | Status: DC | PRN
  Administered 2021-11-05 – 2021-11-06 (×3): 2 via ORAL
  Filled 2021-11-05 (×3): qty 2

## 2021-11-05 MED ORDER — PHENYLEPHRINE HCL-NACL 20-0.9 MG/250ML-% IV SOLN
INTRAVENOUS | Status: AC
  Filled 2021-11-05: qty 750

## 2021-11-05 MED ORDER — PROPOFOL 500 MG/50ML IV EMUL
INTRAVENOUS | Status: DC | PRN
  Administered 2021-11-05: 85 ug/kg/min via INTRAVENOUS

## 2021-11-05 MED ORDER — HYDROCODONE-ACETAMINOPHEN 5-325 MG PO TABS
1.0000 | ORAL_TABLET | ORAL | Status: DC | PRN
  Administered 2021-11-05 – 2021-11-06 (×2): 2 via ORAL
  Filled 2021-11-05 (×2): qty 2

## 2021-11-05 MED ORDER — DIPHENHYDRAMINE HCL 12.5 MG/5ML PO ELIX: 12.5000 mg | ORAL_SOLUTION | ORAL | Status: DC | PRN

## 2021-11-05 MED ORDER — ONDANSETRON HCL 4 MG/2ML IJ SOLN
4.0000 mg | Freq: Four times a day (QID) | INTRAMUSCULAR | Status: DC | PRN
  Administered 2021-11-05: 4 mg via INTRAVENOUS
  Filled 2021-11-05: qty 2

## 2021-11-05 MED ORDER — CEFAZOLIN SODIUM-DEXTROSE 2-4 GM/100ML-% IV SOLN
2.0000 g | Freq: Four times a day (QID) | INTRAVENOUS | Status: AC
  Administered 2021-11-05 (×2): 2 g via INTRAVENOUS
  Filled 2021-11-05 (×2): qty 100

## 2021-11-05 MED ORDER — DEXAMETHASONE SODIUM PHOSPHATE 10 MG/ML IJ SOLN: 8.0000 mg | Freq: Once | INTRAMUSCULAR | Status: DC

## 2021-11-05 MED ORDER — MORPHINE SULFATE (PF) 2 MG/ML IV SOLN
0.5000 mg | INTRAVENOUS | Status: DC | PRN
  Administered 2021-11-05 – 2021-11-06 (×3): 1 mg via INTRAVENOUS
  Filled 2021-11-05 (×3): qty 1

## 2021-11-05 MED ORDER — BUPIVACAINE IN DEXTROSE 0.75-8.25 % IT SOLN
INTRATHECAL | Status: DC | PRN
  Administered 2021-11-05: 2 mL via INTRATHECAL

## 2021-11-05 MED ORDER — PANTOPRAZOLE SODIUM 40 MG PO TBEC
40.0000 mg | DELAYED_RELEASE_TABLET | Freq: Every day | ORAL | Status: DC
  Administered 2021-11-06: 40 mg via ORAL
  Filled 2021-11-05: qty 1

## 2021-11-05 MED ORDER — ONDANSETRON HCL 4 MG PO TABS: 4.0000 mg | ORAL_TABLET | Freq: Four times a day (QID) | ORAL | Status: DC | PRN

## 2021-11-05 MED ORDER — STERILE WATER FOR IRRIGATION IR SOLN
Status: DC | PRN
  Administered 2021-11-05: 2000 mL

## 2021-11-05 MED ORDER — METOPROLOL SUCCINATE ER 25 MG PO TB24
25.0000 mg | ORAL_TABLET | Freq: Every day | ORAL | Status: DC
  Filled 2021-11-05: qty 1

## 2021-11-05 MED ORDER — POVIDONE-IODINE 10 % EX SWAB
2.0000 | Freq: Once | CUTANEOUS | Status: AC
  Administered 2021-11-05: 2 via TOPICAL

## 2021-11-05 SURGICAL SUPPLY — 40 items
ADH SKN CLS APL DERMABOND .7 (GAUZE/BANDAGES/DRESSINGS) ×1
BAG COUNTER SPONGE SURGICOUNT (BAG) IMPLANT
BAG DECANTER FOR FLEXI CONT (MISCELLANEOUS) IMPLANT
BAG SPEC THK2 15X12 ZIP CLS (MISCELLANEOUS)
BAG SPNG CNTER NS LX DISP (BAG) ×1
BAG ZIPLOCK 12X15 (MISCELLANEOUS) IMPLANT
BLADE SAG 18X100X1.27 (BLADE) ×1 IMPLANT
COVER PERINEAL POST (MISCELLANEOUS) ×1 IMPLANT
COVER SURGICAL LIGHT HANDLE (MISCELLANEOUS) ×1 IMPLANT
CUP ACETBLR 52 OD PINNACLE (Hips) IMPLANT
DERMABOND ADVANCED .7 DNX12 (GAUZE/BANDAGES/DRESSINGS) ×1 IMPLANT
DRAPE FOOT SWITCH (DRAPES) ×1 IMPLANT
DRAPE STERI IOBAN 125X83 (DRAPES) ×1 IMPLANT
DRAPE U-SHAPE 47X51 STRL (DRAPES) ×2 IMPLANT
DRESSING AQUACEL AG SP 3.5X10 (GAUZE/BANDAGES/DRESSINGS) ×1 IMPLANT
DRSG AQUACEL AG SP 3.5X10 (GAUZE/BANDAGES/DRESSINGS) ×1
DURAPREP 26ML APPLICATOR (WOUND CARE) ×1 IMPLANT
ELECT REM PT RETURN 15FT ADLT (MISCELLANEOUS) ×1 IMPLANT
GLOVE BIO SURGEON STRL SZ 6 (GLOVE) ×1 IMPLANT
GLOVE BIOGEL PI IND STRL 6.5 (GLOVE) ×1 IMPLANT
GLOVE BIOGEL PI IND STRL 7.5 (GLOVE) ×1 IMPLANT
GLOVE ORTHO TXT STRL SZ7.5 (GLOVE) ×2 IMPLANT
GOWN STRL REUS W/ TWL LRG LVL3 (GOWN DISPOSABLE) ×3 IMPLANT
GOWN STRL REUS W/TWL LRG LVL3 (GOWN DISPOSABLE) ×3
HEAD CERAMIC DELTA 36 PLUS 1.5 (Hips) IMPLANT
HOLDER FOLEY CATH W/STRAP (MISCELLANEOUS) ×1 IMPLANT
KIT TURNOVER KIT A (KITS) IMPLANT
LINER NEUTRAL 52X36MM PLUS 4 (Liner) IMPLANT
PACK ANTERIOR HIP CUSTOM (KITS) ×1 IMPLANT
SCREW 6.5MMX30MM (Screw) IMPLANT
STEM FEM ACTIS HIGH SZ3 (Stem) IMPLANT
SUT MNCRL AB 4-0 PS2 18 (SUTURE) ×1 IMPLANT
SUT STRATAFIX 0 PDS 27 VIOLET (SUTURE) ×1
SUT VIC AB 1 CT1 36 (SUTURE) ×3 IMPLANT
SUT VIC AB 2-0 CT1 27 (SUTURE) ×2
SUT VIC AB 2-0 CT1 TAPERPNT 27 (SUTURE) ×2 IMPLANT
SUTURE STRATFX 0 PDS 27 VIOLET (SUTURE) ×1 IMPLANT
TRAY FOLEY MTR SLVR 16FR STAT (SET/KITS/TRAYS/PACK) IMPLANT
TUBE SUCTION HIGH CAP CLEAR NV (SUCTIONS) ×1 IMPLANT
WATER STERILE IRR 1000ML POUR (IV SOLUTION) ×1 IMPLANT

## 2021-11-05 NOTE — Evaluation (Signed)
Physical Therapy Evaluation Patient Details Name: Donna Bullock MRN: [id number] DOB: November 20, 1955 Today's Date: 11/05/2021  History of Present Illness  65 yo female s/p R THA-DA 11/05/21  Clinical Impression  On eval POD 0, pt required Mod A for mobility. Once sitting EOB, pt became nauseous and vomited. After changing gown, she was able to stand and take some side steps along the bedside. Pt reported pain as 10/10 with activity. Assisted back to bed end of session. Will hopefully be able to progress activity on tomorrow morning. Plan is for possible d/c home with HEP.        Recommendations for follow up therapy are one component of a multi-disciplinary discharge planning process, led by the attending physician.  Recommendations may be updated based on patient status, additional functional criteria and insurance authorization.  Follow Up Recommendations Follow physician's recommendations for discharge plan and follow up therapies      Assistance Recommended at Discharge Intermittent Supervision/Assistance  Patient can return home with the following  A little help with walking and/or transfers;A little help with bathing/dressing/bathroom;Help with stairs or ramp for entrance;Assistance with cooking/housework;Assist for transportation    Equipment Recommendations Rolling walker (2 wheels)  Recommendations for Other Services       Functional Status Assessment Patient has had a recent decline in their functional status and demonstrates the ability to make significant improvements in function in a reasonable and predictable amount of time.     Precautions / Restrictions Precautions Precautions: Fall Restrictions Weight Bearing Restrictions: No RLE Weight Bearing: Weight bearing as tolerated      Mobility  Bed Mobility Overal bed mobility: Needs Assistance Bed Mobility: Supine to Sit     Supine to sit: Mod assist, HOB elevated     General bed mobility comments: Assist for R LE  and trunk to upright. Increased time and effort for pt. Cues for safety, technique.    Transfers Overall transfer level: Needs assistance Equipment used: Rolling walker (2 wheels) Transfers: Sit to/from Stand Sit to Stand: Min assist, From elevated surface           General transfer comment: Assist to rise, steady, control descent. Cues for safety, technique, hand placement.    Ambulation/Gait Ambulation/Gait assistance: Min guard   Assistive device: Rolling walker (2 wheels)         General Gait Details: Side steps along bedside only. Cues for safety, techique. Min guard A. Increased time. Deferred ambulation 2* N/V and pain.  Stairs            Wheelchair Mobility    Modified Rankin (Stroke Patients Only)       Balance Overall balance assessment: Needs assistance         Standing balance support: Bilateral upper extremity supported, Reliant on assistive device for balance, During functional activity Standing balance-Leahy Scale: Poor                               Pertinent Vitals/Pain Pain Assessment Pain Assessment: 0-10 Pain Score: 10-Worst pain ever Pain Location: R hip Pain Descriptors / Indicators: Sharp, Operative site guarding, Grimacing Pain Intervention(s): Limited activity within patient's tolerance, Monitored during session, Repositioned, Patient requesting pain meds-RN notified, Ice applied    Home Living Family/patient expects to be discharged to:: Private residence Living Arrangements: Alone   Type of Home: House Home Access: Stairs to enter   [employer] of Steps: 2   Home Layout: One level  Home Equipment: Kasandra Knudsen - single point;Shower seat      Prior Function Prior Level of Function : Independent/Modified Independent                     Hand Dominance        Extremity/Trunk Assessment   Upper Extremity Assessment Upper Extremity Assessment: Overall WFL for tasks assessed    Lower  Extremity Assessment Lower Extremity Assessment: Generalized weakness    Cervical / Trunk Assessment Cervical / Trunk Assessment: Normal  Communication      Cognition Arousal/Alertness: Awake/alert Behavior During Therapy: WFL for tasks assessed/performed Overall Cognitive Status: Within Functional Limits for tasks assessed                                          General Comments      Exercises     Assessment/Plan    PT Assessment Patient needs continued PT services  PT Problem List Decreased strength;Decreased mobility;Decreased range of motion;Decreased activity tolerance;Decreased balance;Decreased knowledge of use of DME;Pain       PT Treatment Interventions DME instruction;Gait training;Therapeutic activities;Therapeutic exercise;Patient/family education;Balance training;Functional mobility training;Stair training    PT Goals (Current goals can be found in the Care Plan section)  Acute Rehab PT Goals Patient Stated Goal: less pain PT Goal Formulation: With patient Time For Goal Achievement: 11/19/21 Potential to Achieve Goals: Good    Frequency 7X/week     Co-evaluation               AM-PAC PT "6 Clicks" Mobility  Outcome Measure Help needed turning from your back to your side while in a flat bed without using bedrails?: A Little Help needed moving from lying on your back to sitting on the side of a flat bed without using bedrails?: A Little Help needed moving to and from a bed to a chair (including a wheelchair)?: A Little Help needed standing up from a chair using your arms (e.g., wheelchair or bedside chair)?: A Little Help needed to walk in hospital room?: A Little Help needed climbing 3-5 steps with a railing? : A Lot 6 Click Score: 17    End of Session Equipment Utilized During Treatment: Gait belt Activity Tolerance: Patient limited by pain (limited by N/V) Patient left: in bed;with call bell/phone within reach;with bed alarm  set   PT Visit Diagnosis: Pain;Other abnormalities of gait and mobility (R26.89) Pain - Right/Left: Right Pain - part of body: Hip    Time: 1600-1620 PT Time Calculation (min) (ACUTE ONLY): 20 min   Charges:   PT Evaluation $PT Eval Low Complexity: [place], PT Acute Rehabilitation  Office: [phone] Pager: [phone]

## 2021-11-05 NOTE — Care Plan (Signed)
Ortho Bundle Case Management Note  Patient Details  Name: Donna Bullock MRN: [id number] Date of Birth: 11/22/1955  R THA on 11-05-21 DCP:  Home with god-dtr DME:  RW ordered through [facility] PT: HEP                   DME Arranged:  Gilford Rile rolling DME Agency:  [employer]  HH Arranged:  NA [place] Agency:  NA  Additional Comments: Please contact me with any questions of if this plan should need to change.  Marianne Sofia, RN,CCM [facility]  [phone] 11/05/2021, 2:13 PM

## 2021-11-05 NOTE — Anesthesia Preprocedure Evaluation (Signed)
Anesthesia Evaluation  Patient identified by MRN, date of birth, ID band Patient awake    Reviewed: Allergy & Precautions, NPO status , Patient's Chart, lab work & pertinent test results  Airway Mallampati: II  TM Distance: >3 FB Neck ROM: Full    Dental no notable dental hx.    Pulmonary sleep apnea , Patient abstained from smoking., former smoker,    Pulmonary exam normal breath sounds clear to auscultation       Cardiovascular hypertension, Pt. on medications + CAD and + Past MI  Normal cardiovascular exam Rhythm:Regular Rate:Normal     Neuro/Psych Depression negative neurological ROS  negative psych ROS   GI/Hepatic Neg liver ROS, GERD  ,  Endo/Other  negative endocrine ROS  Renal/GU negative Renal ROS  negative genitourinary   Musculoskeletal  (+) Arthritis , Osteoarthritis,  Fibromyalgia -  Abdominal   Peds negative pediatric ROS (+)  Hematology negative hematology ROS (+)   Anesthesia Other Findings   Reproductive/Obstetrics negative OB ROS                             Anesthesia Physical Anesthesia Plan  ASA: 3  Anesthesia Plan: Spinal   Post-op Pain Management: Regional block* and Minimal or no pain anticipated   Induction: Intravenous  PONV Risk Score and Plan: 2 and Ondansetron, Midazolam and Treatment may vary due to age or medical condition  Airway Management Planned: Simple Face Mask  Additional Equipment:   Intra-op Plan:   Post-operative Plan:   Informed Consent: I have reviewed the patients History and Physical, chart, labs and discussed the procedure including the risks, benefits and alternatives for the proposed anesthesia with the patient or authorized representative who has indicated his/her understanding and acceptance.     Dental advisory given  Plan Discussed with: CRNA  Anesthesia Plan Comments:         Anesthesia Quick Evaluation

## 2021-11-05 NOTE — Transfer of Care (Signed)
Immediate Anesthesia Transfer of Care Note  Patient: Donna Bullock  Procedure(s) Performed: Procedure(s): TOTAL HIP ARTHROPLASTY ANTERIOR APPROACH (Right)  Patient Location: PACU  Anesthesia Type:Spinal  Level of Consciousness:  sedated, patient cooperative and responds to stimulation  Airway & Oxygen Therapy:Patient Spontanous Breathing and Patient connected to face mask oxgen  Post-op Assessment:  Report given to PACU RN and Post -op Vital signs reviewed and stable  Post vital signs:  Reviewed and stable  Last Vitals:  Vitals:   11/05/21 0631  BP: 134/77  Pulse: 60  Resp: 18  Temp: 36.8 C  SpO2: 71%    Complications: No apparent anesthesia complications

## 2021-11-05 NOTE — Op Note (Signed)
NAME:  Donna Bullock.: [id number]      MEDICAL RECORD NO.: [id number]      FACILITY:  [facility]      PHYSICIAN:  [name]  DATE OF BIRTH:  February 04, 1956     DATE OF PROCEDURE:  11/05/2021                                 OPERATIVE REPORT         PREOPERATIVE DIAGNOSIS: Right  hip osteoarthritis.      POSTOPERATIVE DIAGNOSIS:  Right hip osteoarthritis.      PROCEDURE:  Right total hip replacement through an anterior approach   utilizing DePuy THR system, component size 52 mm pinnacle cup, a size 36+4 neutral   Altrex liner, a size 3 Hi Actis stem with a 36+1.5 delta ceramic   ball.      SURGEON:  Pietro Cassis. Alvan Dame, M.D.      ASSISTANT:  Costella Hatcher, PA-C     ANESTHESIA:  Spinal.      SPECIMENS:  None.      COMPLICATIONS:  None.      BLOOD LOSS:  200 cc     DRAINS:  None.      INDICATION OF THE PROCEDURE:  Donna Bullock is a 66 y.o. female who had   presented to office for evaluation of right hip pain.  Radiographs revealed   progressive degenerative changes with bone-on-bone   articulation of the  hip joint, including subchondral cystic changes and osteophytes.  The patient had painful limited range of   motion significantly affecting their overall quality of life and function.  The patient was failing to    respond to conservative measures including medications and/or injections and activity modification and at this point was ready   to proceed with more definitive measures.  Consent was obtained for   benefit of pain relief.  Specific risks of infection, DVT, component   failure, dislocation, neurovascular injury, and need for revision surgery were reviewed in the office.     PROCEDURE IN DETAIL:  The patient was brought to operative theater.   Once adequate anesthesia, preoperative antibiotics, 2 gm of Ancef, 1 gm of Tranexamic Acid, and 10 mg of Decadron were administered, the patient was positioned supine on the  [employer] table.  Once the patient was safely positioned with adequate padding of boney prominences we predraped out the hip, and used fluoroscopy to confirm orientation of the pelvis.      The right hip was then prepped and draped from proximal iliac crest to   mid thigh with a shower curtain technique.      Time-out was performed identifying the patient, planned procedure, and the appropriate extremity.     An incision was then made 2 cm lateral to the   anterior superior iliac spine extending over the orientation of the   tensor fascia lata muscle and sharp dissection was carried down to the   fascia of the muscle.      The fascia was then incised.  The muscle belly was identified and swept   laterally and retractor placed along the superior neck.  Following   cauterization of the circumflex vessels and removing some pericapsular   fat, a second cobra retractor was placed on the inferior  neck.  A T-capsulotomy was made along the line of the   superior neck to the trochanteric fossa, then extended proximally and   distally.  Tag sutures were placed and the retractors were then placed   intracapsular.  We then identified the trochanteric fossa and   orientation of my neck cut and then made a neck osteotomy with the femur on traction.  The femoral   head was removed without difficulty or complication.  Traction was let   off and retractors were placed posterior and anterior around the   acetabulum.      The labrum and foveal tissue were debrided.  I began reaming with a 45 mm   reamer and reamed up to 51 mm reamer with good bony bed preparation and a 52 mm  cup was chosen.  The final 52 mm Pinnacle cup was then impacted under fluoroscopy to confirm the depth of penetration and orientation with respect to   Abduction and forward flexion.  A screw was placed into the ilium followed by the hole eliminator.  The final   36+4 neutral Altrex liner was impacted with good visualized rim fit.  The  cup was positioned anatomically within the acetabular portion of the pelvis.      At this point, the femur was rolled to 100 degrees.  Further capsule was   released off the inferior aspect of the femoral neck.  I then   released the superior capsule proximally.  With the leg in a neutral position the hook was placed laterally   along the femur under the vastus lateralis origin and elevated manually and then held in position using the hook attachment on the bed.  The leg was then extended and adducted with the leg rolled to 100   degrees of external rotation.  Retractors were placed along the medial calcar and posteriorly over the greater trochanter.  Once the proximal femur was fully   exposed, I used a box osteotome to set orientation.  I then began   broaching with the starting chili pepper broach and passed this by hand and then broached up to 3.  With the 3 broach in place I chose a high offset neck and did several trial reductions.  The offset was appropriate, leg lengths   appeared to be equal best matched with the +1.5 head ball trial confirmed radiographically.   Given these findings, I went ahead and dislocated the hip, repositioned all   retractors and positioned the right hip in the extended and abducted position.  The final 3 Hi Actis stem was   chosen and it was impacted down to the level of neck cut.  Based on this   and the trial reductions, a final 36+1.5 delta ceramic ball was chosen and   impacted onto a clean and dry trunnion, and the hip was reduced.  The   hip had been irrigated throughout the case again at this point.  I did   reapproximate the superior capsular leaflet to the anterior leaflet   using #1 Vicryl.  The fascia of the   tensor fascia lata muscle was then reapproximated using #1 Vicryl and #0 Stratafix sutures.  The   remaining wound was closed with 2-0 Vicryl and running 4-0 Monocryl.   The hip was cleaned, dried, and dressed sterilely using Dermabond and    Aquacel dressing.  The patient was then brought   to recovery room in stable condition tolerating the procedure well.    Caryl Pina  Lu Duffel, PA-C was present for the entirety of the case involved from   preoperative positioning, perioperative retractor management, general   facilitation of the case, as well as primary wound closure as assistant.            Pietro Cassis Alvan Dame, M.D.        11/05/2021 9:33 AM

## 2021-11-05 NOTE — Anesthesia Procedure Notes (Signed)
Spinal  Patient location during procedure: OR Start time: 11/05/2021 9:21 AM End time: 11/05/2021 9:26 AM Reason for block: surgical anesthesia Staffing Performed: anesthesiologist  Anesthesiologist: Lynda Rainwater, MD Performed by: Lynda Rainwater, MD Authorized by: Lynda Rainwater, MD   Preanesthetic Checklist Completed: patient identified, IV checked, site marked, risks and benefits discussed, surgical consent, monitors and equipment checked, pre-op evaluation and timeout performed Spinal Block Patient position: sitting Prep: DuraPrep Patient monitoring: heart rate, cardiac monitor, continuous pulse ox and blood pressure Approach: midline Location: L3-4 Injection technique: single-shot Needle Needle type: Quincke  Needle gauge: 22 G Needle length: 9 cm Assessment Sensory level: T4 Events: CSF return Additional Notes First attempt with 24 g unsuccessful.  Switch to 22 g successful with first attempt

## 2021-11-05 NOTE — H&P (Signed)
TOTAL HIP ADMISSION H&P  Patient is admitted for right total hip arthroplasty.  Subjective:  Chief Complaint: right hip pain  HPI: Donna Bullock, 66 y.o. female, has a history of pain and functional disability in the right hip(s) due to arthritis and patient has failed non-surgical conservative treatments for greater than 12 weeks to include NSAID's and/or analgesics and activity modification.  Onset of symptoms was gradual starting 2 years ago with gradually worsening course since that time.The patient noted no past surgery on the right hip(s).  Patient currently rates pain in the right hip at 8 out of 10 with activity. Patient has worsening of pain with activity and weight bearing, pain that interfers with activities of daily living, and pain with passive range of motion. Patient has evidence of joint space narrowing by imaging studies. This condition presents safety issues increasing the risk of falls.   There is no current active infection.  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 Repeat  anterior MI 2002 with LAD vasospasm. Repeat inferior MI 2003 with occlusion RCA with overlapping Cypher DES in RCA. Repeat cath 2003 with profound vasospasm LAd and Circumflex. Repeat cath 2007  with patent RCA and no disease in LAD or Circumflex.   Depression    Environmental allergies    Family history of adverse reaction to anesthesia    mother had ? problem with anesthesia - was alcoholic - "mind was never right again"   Family history of breast cancer    Family history of leukemia    Family history of stomach cancer    Fibromyalgia    GERD (gastroesophageal reflux disease)    Heart murmur    History of kidney stones    History of stomach ulcers    due to taking aspirin for headaches   HTN (hypertension)    Hyperlipidemia    Myocardial infarction ([place])    times 4 "very minor" stents x2    Osteopenia    Osteoporosis    Rotator cuff tear    left   Sleep apnea    uses c pap - does not know settings    Urinary incontinence     Past Surgical History:  Procedure Laterality Date   ABDOMINAL HYSTERECTOMY     BACK SURGERY     CYSTOSCOPY WITH URETEROSCOPY  AND STENT PLACEMENT Left 08/24/2019   Procedure: CYSTOSCOPY WITH DIAGNOSTIC URETEROSCOPY , REMOVAL OF STONES AND LEFT  STENT PLACEMENT;  Surgeon: Ardis Hughs, MD;  Location: [facility];  Service: Urology;  Laterality: Left;   LAPAROSCOPIC LYSIS OF ADHESIONS     LEFT HEART CATH AND CORONARY ANGIOGRAPHY N/A 11/16/2017   Procedure: LEFT HEART CATH AND CORONARY ANGIOGRAPHY;  Surgeon: Belva Crome, MD;  Location: [place] [facility];  Service: Cardiovascular;  Laterality: N/A;   MYOMECTOMY     age 60   PELVIC ABCESS DRAINAGE     SHOULDER OPEN ROTATOR CUFF REPAIR Left 11/16/2014   Procedure: LEFT MINI OPEN ROTATOR CUFF REPAIR SHOULDER OPEN WITH SUBACROMIAL DECOMPRESSION;   Surgeon: Susa Day, MD;  Location: [facility];  Service: Orthopedics;  Laterality: Left;    Current Facility-Administered Medications  Medication Dose Route Frequency Provider Last Rate Last Admin   ceFAZolin (ANCEF) IVPB 2g/100 mL premix  2 g Intravenous On Call to OR Irving Copas, PA-C       chlorhexidine (PERIDEX) 0.12 % solution 15 mL  15 mL Mouth/Throat Once Oleta Mouse, MD       Or   Oral care mouth rinse  15 mL Mouth Rinse Once Oleta Mouse, MD       dexamethasone (DECADRON) injection 8 mg  8 mg Intravenous Once Irving Copas, PA-C       lactated ringers infusion   Intravenous Continuous Irving Copas, PA-C       lactated ringers infusion   Intravenous Continuous Oleta Mouse, MD       phenylephrine (NEOSYNEPHRINE) 20-0.9 MG/250ML-% infusion            povidone-iodine 10 % swab 2 Application  2 Application Topical Once Irving Copas, PA-C       tranexamic acid (CYKLOKAPRON) IVPB 1,000 mg  1,000 mg Intravenous To OR Irving Copas, PA-C       Allergies  Allergen Reactions   Orencia [Abatacept] Hives   Other Hives and Diarrhea    Peppers    Tomato Diarrhea   Valium Other (See Comments)    Altered mental state   Enbrel [Etanercept] Rash    Social History   Tobacco Use   Smoking status: Former    Packs/day: 0.50    Types: Cigarettes    Passive exposure: Past   Smokeless tobacco: Never   Tobacco comments:    currently using e-cigs  Substance Use Topics   Alcohol use: No    Family History  Problem Relation Age of Onset   Alcohol abuse Father    Lung cancer Father    Cancer - Lung Father    Alcohol abuse Mother    Breast cancer Mother 13   Breast cancer Maternal Aunt 74   Breast cancer Maternal Grandmother 34   Breast cancer Maternal Aunt 72   Breast cancer Cousin 58   Leukemia Sister 6   Stomach cancer Paternal Aunt 87   Cancer Cousin 62       type unk     Review of Systems  Constitutional:  Negative for chills and  fever.  Respiratory:  Negative for cough and shortness of breath.   Cardiovascular:  Negative for chest pain.  Gastrointestinal:  Negative for nausea and vomiting.  Musculoskeletal:  Positive for arthralgias.     Objective:  Physical Exam Well nourished and well developed. General: Alert and oriented x3, cooperative and pleasant, no acute distress. Head: normocephalic, atraumatic, neck supple.  Eyes: EOMI.  Musculoskeletal: Right hip exam: She does have reproducible pain anteriorly with active and passive hip flexion internal rotation over 5 degrees with external rotation to 20 degrees. No significant lower extremity edema, erythema or calf tenderness Left hip exam reveals more fluid range of motion without reproducible pain   Calves soft and nontender. Motor function intact in LE. Strength 5/5 LE bilaterally. Neuro: Distal pulses 2+. Sensation to light touch intact in LE.  Vital signs in last 24 hours: Temp:  [98.2 F (36.8 C)] 98.2 F (36.8 C) (09/19 0631) Pulse Rate:  [60] 60 (09/19 0631) Resp:  [18] 18 (09/19 0631) BP: (134)/(77) 134/77 (09/19 0631) SpO2:  [95 %] 95 % (09/19 0631) Weight:  [76.7 kg] 76.7 kg (09/19 0639)  Labs:   Estimated body mass index is 26.47 kg/m as calculated from the following:   Height as of this encounter: '5\' 7"'$  (1.702 m).   Weight as of this encounter: 76.7 kg.   Imaging Review Plain radiographs demonstrate severe degenerative joint disease of the right hip(s). The bone quality appears to be adequate for age and reported activity level.      Assessment/Plan:  End stage arthritis, right hip(s)  The patient history, physical examination, clinical judgement of the provider and imaging studies are consistent with end stage degenerative joint disease of the right hip(s) and total hip arthroplasty is deemed medically necessary. The treatment options including medical management, injection therapy, arthroscopy and arthroplasty were  discussed at length. The risks and benefits of total hip arthroplasty were presented and reviewed. The risks due to aseptic loosening, infection, stiffness, dislocation/subluxation,  thromboembolic complications and other imponderables were discussed.  The patient acknowledged the explanation, agreed to proceed with the plan and consent was signed. Patient is being admitted for inpatient treatment for surgery, pain control, PT, OT, prophylactic antibiotics, VTE prophylaxis, progressive ambulation and ADL's and discharge planning.The patient is planning to be discharged  home.  Therapy Plans: HEP Disposition: Home with god-daughter (staying with patient for a week) Planned DVT Prophylaxis: Plavix DME needed: walker PCP: Dr. Maudie Mercury, clearance received Cardiologist: Dr. Johney Frame, clearance received ( hx of MI with PCI) TXA: IV Allergies: Valium - rage/cognitive changes, no NSAIDs - cardiac Anesthesia Concerns: None BMI: 26.6 Last HgbA1c: Not diabetic   Other: - no NSAIDs - Tylenol, robaxin, hydrocodone - smoker - very rarely - we discussed stopping 4 weeks pre/post op - ? RA meds ?  Costella Hatcher, PA-C Orthopedic Surgery [facility] [place] [phone]

## 2021-11-05 NOTE — Anesthesia Postprocedure Evaluation (Signed)
Anesthesia Post Note  Patient: Donna Bullock  Procedure(s) Performed: TOTAL HIP ARTHROPLASTY ANTERIOR APPROACH (Right: Hip)     Patient location during evaluation: PACU Anesthesia Type: Spinal Level of consciousness: awake and alert Pain management: pain level controlled Vital Signs Assessment: post-procedure vital signs reviewed and stable Respiratory status: spontaneous breathing, nonlabored ventilation and respiratory function stable Cardiovascular status: blood pressure returned to baseline and stable Postop Assessment: no apparent nausea or vomiting Anesthetic complications: no   No notable events documented.  Last Vitals:  Vitals:   11/05/21 1211 11/05/21 1214  BP: 111/87   Pulse: (!) 59   Resp: 16   Temp: 36.7 C   SpO2: 90% 96%    Last Pain:  Vitals:   11/05/21 1211  TempSrc: Oral  PainSc: 5                  Lynda Rainwater

## 2021-11-05 NOTE — Interval H&P Note (Signed)
History and Physical Interval Note:  11/05/2021 7:05 AM  Donna Bullock  has presented today for surgery, with the diagnosis of Right hip osteoarthritis.  The various methods of treatment have been discussed with the patient and family. After consideration of risks, benefits and other options for treatment, the patient has consented to  Procedure(s): TOTAL HIP ARTHROPLASTY ANTERIOR APPROACH (Right) as a surgical intervention.  The patient's history has been reviewed, patient examined, no change in status, stable for surgery.  I have reviewed the patient's chart and labs.  Questions were answered to the patient's satisfaction.     [name]

## 2021-11-05 NOTE — Discharge Instructions (Signed)

## 2021-11-06 ENCOUNTER — Encounter (HOSPITAL_COMMUNITY): Payer: Self-pay | Admitting: Orthopedic Surgery

## 2021-11-06 DIAGNOSIS — M1611 Unilateral primary osteoarthritis, right hip: Secondary | ICD-10-CM | POA: Diagnosis not present

## 2021-11-06 LAB — BASIC METABOLIC PANEL
Anion gap: 7 (ref 5–15)
BUN: 9 mg/dL (ref 8–23)
CO2: 26 mmol/L (ref 22–32)
Calcium: 8.6 mg/dL — ABNORMAL LOW (ref 8.9–10.3)
Chloride: 109 mmol/L (ref 98–111)
Creatinine, Ser: 0.57 mg/dL (ref 0.44–1.00)
GFR, Estimated: >60 mL/min (ref >60)
Glucose, Bld: 134 mg/dL — ABNORMAL HIGH (ref 70–99)
Potassium: 3.5 mmol/L (ref 3.5–5.1)
Sodium: 142 mmol/L (ref 135–145)

## 2021-11-06 LAB — CBC
HCT: 37.5 % (ref 36.0–46.0)
Hemoglobin: 12.3 g/dL (ref 12.0–15.0)
MCH: 30.1 pg (ref 26.0–34.0)
MCHC: 32.8 g/dL (ref 30.0–36.0)
MCV: 91.9 fL (ref 80.0–100.0)
Platelets: 190 10*3/uL (ref 150–400)
RBC: 4.08 MIL/uL (ref 3.87–5.11)
RDW: 14.1 % (ref 11.5–15.5)
WBC: 9.2 10*3/uL (ref 4.0–10.5)
nRBC: 0 % (ref 0.0–0.2)

## 2021-11-06 MED ORDER — POLYETHYLENE GLYCOL 3350 17 G PO PACK: 17.0000 g | PACK | Freq: Every day | ORAL | 0 refills | Status: DC | PRN

## 2021-11-06 MED ORDER — HYDROCODONE-ACETAMINOPHEN 5-325 MG PO TABS: 1.0000 | ORAL_TABLET | ORAL | 0 refills | Status: DC | PRN

## 2021-11-06 MED ORDER — METHOCARBAMOL 500 MG PO TABS: 500.0000 mg | ORAL_TABLET | Freq: Four times a day (QID) | ORAL | 2 refills | Status: DC | PRN

## 2021-11-06 MED ORDER — DOCUSATE SODIUM 100 MG PO CAPS: 100.0000 mg | ORAL_CAPSULE | Freq: Two times a day (BID) | ORAL | 0 refills | Status: DC

## 2021-11-06 NOTE — TOC Transition Note (Signed)
Transition of Care [facility]) - CM/SW Discharge Note   Patient Details  Name: Donna Bullock MRN: [id number] Date of Birth: 1955/04/23  [facility] CM/SW Contact:  Lennart Pall, LCSW Phone Number: 11/06/2021, 2:24 PM   Clinical Narrative:    Met with pt and confirming she has received RW via [employer] to room.  Plan for HEP.  No further TOC needs.   Final next level of care: Home/Self Care Barriers to Discharge: No Barriers Identified   Patient Goals and CMS Choice Patient states their goals for this hospitalization and ongoing recovery are:: return home      Discharge Placement                       Discharge Plan and Services                DME Arranged: Walker rolling DME Agency: [employer]       HH Arranged: NA [place] Agency: NA        Social Determinants of Health (SDOH) Interventions     Readmission Risk Interventions     No data to display

## 2021-11-06 NOTE — Progress Notes (Signed)
Subjective: 1 Day Post-Op Procedure(s) (LRB): TOTAL HIP ARTHROPLASTY ANTERIOR APPROACH (Right) Patient reports pain as moderate.   Patient seen in rounds with Dr. Alvan Dame. Patient is sitting in bed on exam this morning. She notes that she was surprised by the amount of pain she had yesterday. She was nauseous and threw up once yesterday, but has not felt that way today.  We will start therapy today.   Objective: Vital signs in last 24 hours: Temp:  [97.5 F (36.4 C)-98.5 F (36.9 C)] 97.8 F (36.6 C) (09/20 0405) Pulse Rate:  [57-70] 65 (09/20 0405) Resp:  [15-20] 16 (09/20 0405) BP: (111-142)/(69-87) 130/72 (09/20 0405) SpO2:  [90 %-100 %] 94 % (09/20 0405)  Intake/Output from previous day:  Intake/Output Summary (Last 24 hours) at 11/06/2021 0752 Last data filed at 11/06/2021 0717 Gross per 24 hour  Intake 2894.76 ml  Output 2150 ml  Net 744.76 ml     Intake/Output this shift: Total I/O In: 558.7 [I.V.:558.7] Out: -   Labs: Recent Labs    11/06/21 0339  HGB 12.3   Recent Labs    11/06/21 0339  WBC 9.2  RBC 4.08  HCT 37.5  PLT 190   Recent Labs    11/06/21 0339  NA 142  K 3.5  CL 109  CO2 26  BUN 9  CREATININE 0.57  GLUCOSE 134*  CALCIUM 8.6*   No results for input(s): "LABPT", "INR" in the last 72 hours.  Exam: General - Patient is Alert and Oriented Extremity - Neurologically intact Sensation intact distally Intact pulses distally Dorsiflexion/Plantar flexion intact Dressing - dressing C/D/I Motor Function - intact, moving foot and toes well on exam.   Past Medical History:  Diagnosis Date   Arthritis    rheumatoid arthritis, and osteoarthritis    Benign tumor of adrenal gland    CAD (coronary artery disease)    Inferior MI 1996 tx with bare metal stent RCA. Repeat  anterior MI 2002 with LAD vasospasm. Repeat inferior MI 2003 with occlusion RCA with overlapping Cypher DES in RCA. Repeat cath 2003 with profound vasospasm LAd and  Circumflex. Repeat cath 2007  with patent RCA and no disease in LAD or Circumflex.   Depression    Environmental allergies    Family history of adverse reaction to anesthesia    mother had ? problem with anesthesia - was alcoholic - "mind was never right again"   Family history of breast cancer    Family history of leukemia    Family history of stomach cancer    Fibromyalgia    GERD (gastroesophageal reflux disease)    Heart murmur    History of kidney stones    History of stomach ulcers    due to taking aspirin for headaches   HTN (hypertension)    Hyperlipidemia    Myocardial infarction ([place])    times 4 "very minor" stents x2    Osteopenia    Osteoporosis    Rotator cuff tear    left   Sleep apnea    uses c pap - does not know settings    Urinary incontinence     Assessment/Plan: 1 Day Post-Op Procedure(s) (LRB): TOTAL HIP ARTHROPLASTY ANTERIOR APPROACH (Right) Principal Problem:   S/P total right hip arthroplasty  Estimated body mass index is 26.47 kg/m as calculated from the following:   Height as of this encounter: '5\' 7"'$  (1.702 m).   Weight as of this encounter: 76.7 kg. Advance diet Up with therapy  D/C IV fluids  DVT Prophylaxis - Aspirin Weight bearing as tolerated.  Hgb stable at 12.3 this AM.   Plan is to go Home after hospital stay. Plan for discharge today after meeting goals with therapy. Follow up in the office in 2 weeks.   Griffith Citron, PA-C Orthopedic Surgery [phone] 11/06/2021, 7:52 AM

## 2021-11-06 NOTE — Plan of Care (Signed)
  Problem: Education: Goal: Knowledge of the prescribed therapeutic regimen will improve Outcome: Progressing Goal: Understanding of discharge needs will improve Outcome: Progressing Goal: Individualized Educational Video(s) Outcome: Progressing   Problem: Activity: Goal: Ability to avoid complications of mobility impairment will improve Outcome: Progressing Goal: Ability to tolerate increased activity will improve Outcome: Progressing   Problem: Clinical Measurements: Goal: Postoperative complications will be avoided or minimized Outcome: Progressing   Problem: Pain Management: Goal: Pain level will decrease with appropriate interventions Outcome: Progressing   Problem: Skin Integrity: Goal: Will show signs of wound healing Outcome: Progressing   Problem: Education: Goal: Knowledge of General Education information will improve Description: Including pain rating scale, medication(s)/side effects and non-pharmacologic comfort measures Outcome: Progressing   Problem: Health Behavior/Discharge Planning: Goal: Ability to manage health-related needs will improve Outcome: Progressing   Problem: Clinical Measurements: Goal: Ability to maintain clinical measurements within normal limits will improve Outcome: Progressing Goal: Will remain free from infection Outcome: Progressing Goal: Diagnostic test results will improve Outcome: Progressing Goal: Respiratory complications will improve Outcome: Progressing Goal: Cardiovascular complication will be avoided Outcome: Progressing   Problem: Activity: Goal: Risk for activity intolerance will decrease Outcome: Progressing   Problem: Coping: Goal: Level of anxiety will decrease Outcome: Progressing   Problem: Elimination: Goal: Will not experience complications related to bowel motility Outcome: Progressing Goal: Will not experience complications related to urinary retention Outcome: Progressing   Problem: Skin  Integrity: Goal: Risk for impaired skin integrity will decrease Outcome: Progressing

## 2021-11-06 NOTE — Plan of Care (Signed)

## 2021-11-06 NOTE — Progress Notes (Signed)
Physical Therapy Treatment Patient Details Name: Donna Bullock MRN: [id number] DOB: 1955/07/24 Today's Date: 11/06/2021   History of Present Illness 66 yo female s/p R THA-DA 11/05/21    PT Comments    Pt reports improved pain with mobility.  Pt ambulated in hallway and performed LE exercises.  Will return to practice stairs prior to d/c.   Recommendations for follow up therapy are one component of a multi-disciplinary discharge planning process, led by the attending physician.  Recommendations may be updated based on patient status, additional functional criteria and insurance authorization.  Follow Up Recommendations  Follow physician's recommendations for discharge plan and follow up therapies     Assistance Recommended at Discharge Intermittent Supervision/Assistance  Patient can return home with the following A little help with walking and/or transfers;A little help with bathing/dressing/bathroom;Help with stairs or ramp for entrance;Assistance with cooking/housework;Assist for transportation   Equipment Recommendations  Rolling walker (2 wheels)    Recommendations for Other Services       Precautions / Restrictions Precautions Precautions: Fall Restrictions Weight Bearing Restrictions: No RLE Weight Bearing: Weight bearing as tolerated     Mobility  Bed Mobility Overal bed mobility: Needs Assistance Bed Mobility: Supine to Sit     Supine to sit: HOB elevated, Min guard     General bed mobility comments: provided gait belt for pt to self assist LE, pt reports improved pain control with belt assist    Transfers Overall transfer level: Needs assistance Equipment used: Rolling walker (2 wheels) Transfers: Sit to/from Stand Sit to Stand: Min guard           General transfer comment: verbal cues for UE and LE positioning for pain control and safety    Ambulation/Gait Ambulation/Gait assistance: Min guard Gait Distance (Feet): 60 Feet Assistive device:  Rolling walker (2 wheels) Gait Pattern/deviations: Step-to pattern, Decreased stance time - right, Antalgic       General Gait Details: verbal cues for sequence, RW positioning, step length, heel strike, posture   Stairs             Wheelchair Mobility    Modified Rankin (Stroke Patients Only)       Balance                                            Cognition Arousal/Alertness: Awake/alert Behavior During Therapy: WFL for tasks assessed/performed Overall Cognitive Status: Within Functional Limits for tasks assessed                                          Exercises Total Joint Exercises Ankle Circles/Pumps: AROM, Both, 10 reps Quad Sets: AROM, Both, 10 reps Heel Slides: AAROM, Right, 10 reps, Supine Hip ABduction/ADduction: AAROM, Right, Supine, 10 reps Long Arc Quad: AROM, Right, Seated, 10 reps    General Comments        Pertinent Vitals/Pain Pain Assessment Pain Assessment: 0-10 Pain Score: 6  Pain Location: R hip Pain Descriptors / Indicators: Operative site guarding, Grimacing, Burning, Sore Pain Intervention(s): Repositioned, Monitored during session, Premedicated before session    Home Living                          Prior Function  PT Goals (current goals can now be found in the care plan section) Progress towards PT goals: Progressing toward goals    Frequency    7X/week      PT Plan Current plan remains appropriate    Co-evaluation              AM-PAC PT "6 Clicks" Mobility   Outcome Measure  Help needed turning from your back to your side while in a flat bed without using bedrails?: A Little Help needed moving from lying on your back to sitting on the side of a flat bed without using bedrails?: A Little Help needed moving to and from a bed to a chair (including a wheelchair)?: A Little Help needed standing up from a chair using your arms (e.g., wheelchair or  bedside chair)?: A Little Help needed to walk in hospital room?: A Little Help needed climbing 3-5 steps with a railing? : A Little 6 Click Score: 18    End of Session Equipment Utilized During Treatment: Gait belt Activity Tolerance: Patient tolerated treatment well Patient left: in chair;with call bell/phone within reach;with chair alarm set Nurse Communication: Mobility status PT Visit Diagnosis: Pain;Other abnormalities of gait and mobility (R26.89) Pain - Right/Left: Right Pain - part of body: Hip     Time: 1000-1026 PT Time Calculation (min) (ACUTE ONLY): 26 min  Charges:  $Gait Training: 8-22 mins $Therapeutic Exercise: 8-22 mins                    Jannette Spanner PT, DPT Physical Therapist Acute Rehabilitation Services Preferred contact method: Secure Chat Weekend Pager Only: [phone] Office: [place] 11/06/2021, 11:40 AM

## 2021-11-06 NOTE — Progress Notes (Signed)
Physical Therapy Treatment Patient Details Name: Donna Bullock MRN: [id number] DOB: 09-28-1955 Today's Date: 11/06/2021   History of Present Illness 66 yo female s/p R THA-DA 11/05/21    PT Comments    Pt ambulated to bathroom and then practiced safe stair technique with daughter.  Pt and daughter report understanding.  Pt has HEP handout and stair handout.  Pt had no further questions and feels ready for d/c home today.   Recommendations for follow up therapy are one component of a multi-disciplinary discharge planning process, led by the attending physician.  Recommendations may be updated based on patient status, additional functional criteria and insurance authorization.  Follow Up Recommendations  Follow physician's recommendations for discharge plan and follow up therapies     Assistance Recommended at Discharge Intermittent Supervision/Assistance  Patient can return home with the following A little help with walking and/or transfers;A little help with bathing/dressing/bathroom;Help with stairs or ramp for entrance;Assistance with cooking/housework;Assist for transportation   Equipment Recommendations  Rolling walker (2 wheels)    Recommendations for Other Services       Precautions / Restrictions Precautions Precautions: Fall Restrictions Weight Bearing Restrictions: No RLE Weight Bearing: Weight bearing as tolerated     Mobility  Bed Mobility Overal bed mobility: Needs Assistance Bed Mobility: Supine to Sit, Sit to Supine     Supine to sit: Min guard Sit to supine: Min guard   General bed mobility comments: provided gait belt for pt to self assist LE, pt reports improved pain control with belt assist    Transfers Overall transfer level: Needs assistance Equipment used: Rolling walker (2 wheels) Transfers: Sit to/from Stand Sit to Stand: Min guard           General transfer comment: verbal cues for UE and LE positioning for pain control and safety     Ambulation/Gait Ambulation/Gait assistance: Min guard Gait Distance (Feet): 40 Feet Assistive device: Rolling walker (2 wheels) Gait Pattern/deviations: Step-to pattern, Decreased stance time - right, Antalgic       General Gait Details: verbal cues for sequence, RW positioning, step length, heel strike, posture   Stairs Stairs: Yes Stairs assistance: Min guard Stair Management: Step to pattern, Backwards, With walker Number of Stairs: 2 General stair comments: pt performed stairs backwards with daughter present and assisting with holding RW, pt also provided with handout on technique; pt and daughter report understanding   Wheelchair Mobility    Modified Rankin (Stroke Patients Only)       Balance                                            Cognition Arousal/Alertness: Awake/alert Behavior During Therapy: WFL for tasks assessed/performed Overall Cognitive Status: Within Functional Limits for tasks assessed                                          Exercises     General Comments        Pertinent Vitals/Pain Pain Assessment Pain Assessment: 0-10 Pain Score: 6  Pain Location: R hip Pain Descriptors / Indicators: Operative site guarding, Grimacing, Burning, Sore Pain Intervention(s): Repositioned, Monitored during session    Home Living  Prior Function            PT Goals (current goals can now be found in the care plan section) Progress towards PT goals: Progressing toward goals    Frequency    7X/week      PT Plan Current plan remains appropriate    Co-evaluation              AM-PAC PT "6 Clicks" Mobility   Outcome Measure  Help needed turning from your back to your side while in a flat bed without using bedrails?: A Little Help needed moving from lying on your back to sitting on the side of a flat bed without using bedrails?: A Little Help needed moving to and  from a bed to a chair (including a wheelchair)?: A Little Help needed standing up from a chair using your arms (e.g., wheelchair or bedside chair)?: A Little Help needed to walk in hospital room?: A Little Help needed climbing 3-5 steps with a railing? : A Little 6 Click Score: 18    End of Session Equipment Utilized During Treatment: Gait belt Activity Tolerance: Patient tolerated treatment well Patient left: in bed;with call bell/phone within reach;with family/visitor present Nurse Communication: Mobility status PT Visit Diagnosis: Other abnormalities of gait and mobility (R26.89) Pain - Right/Left: Right Pain - part of body: Hip     Time: 1660-6004 PT Time Calculation (min) (ACUTE ONLY): 16 min  Charges:  $Gait Training: 8-22 mins                    Jannette Spanner PT, DPT Physical Therapist Acute [facility] Services Preferred contact method: Secure Chat Weekend Pager Only: [phone] Office: [phone]    Kati L Payson 11/06/2021, 2:10 PM

## 2021-11-06 NOTE — Progress Notes (Signed)
Pt is ready for discharge.  PIV removed. Pt remains alert and oriented.  Daughter at bedside.  AVS given.  Pt able to verbalize understanding of all discharge instructions, including wound care. All questions answered.

## 2021-11-10 NOTE — Discharge Summary (Signed)
Patient ID: Donna Bullock MRN: [id number] DOB/AGE: 07-28-1955 66 y.o.  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 Repeat  anterior MI 2002 with LAD vasospasm. Repeat inferior MI 2003 with occlusion RCA with overlapping Cypher DES in RCA. Repeat cath 2003 with profound vasospasm LAd and Circumflex. Repeat cath 2007  with patent RCA and no disease in LAD or Circumflex.   Depression    Environmental allergies    Family history of adverse reaction to anesthesia    mother had ? problem with anesthesia - was alcoholic - "mind was never right again"   Family history of breast cancer    Family history of leukemia    Family history of stomach cancer    Fibromyalgia    GERD (gastroesophageal reflux disease)    Heart murmur    History of kidney stones    History of stomach ulcers    due to taking aspirin for headaches   HTN (hypertension)    Hyperlipidemia    Myocardial infarction ([place])    times 4 "very minor" stents x2    Osteopenia    Osteoporosis    Rotator cuff tear    left   Sleep apnea    uses c pap - does not know settings    Urinary incontinence     Surgeries: Procedure(s): TOTAL HIP ARTHROPLASTY ANTERIOR APPROACH on 11/05/2021   Consultants:   Discharged Condition: Improved  Hospital Course: EVANGELYNE LOJA is an 66 y.o. female who was admitted 11/05/2021 for operative treatment ofS/P total right hip arthroplasty. Patient has severe unremitting pain that affects sleep, daily activities, and work/hobbies. After pre-op clearance the patient was taken to the operating room on 11/05/2021 and underwent  Procedure(s): TOTAL HIP  ARTHROPLASTY ANTERIOR APPROACH.    Patient was given perioperative antibiotics:  Anti-infectives (From admission, onward)    Start     Dose/Rate Route Frequency Ordered Stop   11/05/21 1600  ceFAZolin (ANCEF) IVPB 2g/100 mL premix        2 g 200 mL/hr over 30 Minutes Intravenous Every 6 hours 11/05/21 1214 11/05/21 2145   11/05/21 0615  ceFAZolin (ANCEF) IVPB 2g/100 mL premix        2 g 200 mL/hr over 30 Minutes Intravenous On call to O.R. 11/05/21 6629 11/05/21 4765        Patient was given sequential compression devices, early ambulation, and chemoprophylaxis to prevent DVT. Patient worked with PT and was meeting their goals regarding safe ambulation and transfers.  Patient benefited maximally from hospital stay and there were no complications.    Recent vital signs: No data found.   Recent laboratory studies: No results for input(s): "WBC", "HGB", "HCT", "PLT", "NA", "K", "CL", "CO2", "BUN", "CREATININE", "GLUCOSE", "INR", "CALCIUM" in the last 72 hours.  Invalid input(s): "PT", "2"   Discharge Medications:   Allergies as of 11/06/2021       Reactions   Orencia [abatacept] Hives   Other Hives, Diarrhea   Peppers    Tomato Diarrhea   Valium Other (See Comments)   Altered mental state   Enbrel [etanercept] Rash  Medication List     STOP taking these medications    acetaminophen 325 MG tablet Commonly known as: TYLENOL       TAKE these medications    b complex vitamins tablet Take 1 tablet by mouth daily.   CENTRUM SILVER PO Take 1 tablet by mouth daily.   PreserVision AREDS 2 Caps Take 1 capsule by mouth 2 (two) times daily.   CITRACAL + D PO Take 1 tablet by mouth 2 (two) times daily.   clopidogrel 75 MG tablet Commonly known as: PLAVIX Take 1 tablet (75 mg total) by mouth daily.   diltiazem 120 MG 24 hr capsule Commonly known as: dilTIAZem CD Take 1 capsule (120 mg total) by mouth daily.   diphenhydrAMINE 25 MG tablet Commonly known  as: BENADRYL Take 25 mg by mouth daily as needed for allergies.   docusate sodium 100 MG capsule Commonly known as: COLACE Take 1 capsule (100 mg total) by mouth 2 (two) times daily.   escitalopram 10 MG tablet Commonly known as: LEXAPRO Take 1 tablet (10 mg total) by mouth daily.   fluticasone 50 MCG/ACT nasal spray Commonly known as: FLONASE Place 2 sprays into both nostrils at bedtime.   HYDROcodone-acetaminophen 5-325 MG tablet Commonly known as: NORCO/VICODIN Take 1 tablet by mouth every 4 (four) hours as needed for severe pain.   isosorbide mononitrate 30 MG 24 hr tablet Commonly known as: IMDUR Take 0.5 tablets (15 mg total) by mouth at bedtime.   lisinopril 20 MG tablet Commonly known as: ZESTRIL Take 1 tablet (20 mg total) by mouth daily.   LUCENTIS IO Inject into the eye as directed. Eye injections every 8 - 10 weeks - unsure of dose   methocarbamol 500 MG tablet Commonly known as: ROBAXIN Take 1 tablet (500 mg total) by mouth every 6 (six) hours as needed for muscle spasms (muscle pain).   metoprolol succinate 25 MG 24 hr tablet Commonly known as: TOPROL-XL Take 1 tablet (25 mg total) by mouth daily.   montelukast 10 MG tablet Commonly known as: SINGULAIR Take 10 mg by mouth at bedtime.   Myrbetriq 50 MG Tb24 tablet Generic drug: mirabegron ER Take 50 mg by mouth daily.   omeprazole 20 MG capsule Commonly known as: PRILOSEC Take 1 capsule (20 mg total) by mouth daily.   polyethylene glycol 17 g packet Commonly known as: MIRALAX / GLYCOLAX Take 17 g by mouth daily as needed for mild constipation.   rosuvastatin 10 MG tablet Commonly known as: CRESTOR Take 1 tablet (10 mg total) by mouth daily.   Simponi Aria 50 MG/4ML Soln injection Generic drug: golimumab Inject 50 mg into the vein every 3 (three) months.   Soothe XP Soln Place 1 drop into both eyes daily as needed (dry eyes).   valACYclovir 500 MG tablet Commonly known as: VALTREX TAKE 1  TABLET BY MOUTH TWICE A DAY FOR 3 DAYS AS NEEDED               Discharge Care Instructions  (From admission, onward)           Start     Ordered   11/06/21 0000  Change dressing       Comments: Maintain surgical dressing until follow up in the clinic. If the edges start to pull up, may reinforce with tape. If the dressing is no longer working, may remove and cover with gauze and tape, but must keep the area dry and clean.  Call with any  questions or concerns.   11/06/21 0756            Diagnostic Studies: DG Pelvis Portable  Result Date: 11/05/2021 CLINICAL DATA:  Status post anterior right hip replacement EXAM: PORTABLE PELVIS 1-2 VIEWS COMPARISON:  CT abdomen and pelvis 09/04/2020 FINDINGS: Single frontal view of the bilateral hips. Status post total right hip arthroplasty. No perihardware lucency is seen to indicate hardware failure or loosening. Expected postoperative changes including subcutaneous air and soft tissue swelling about the right hip. Mild pubic symphysis joint space narrowing and subchondral sclerosis. The left femoroacetabular joint space is maintained. No acute fracture or dislocation. IMPRESSION: Status post right hip arthroplasty without evidence of hardware failure. Electronically Signed   By: Yvonne Kendall M.D.   On: 11/05/2021 12:01   DG HIP UNILAT WITH PELVIS 1V RIGHT  Result Date: 11/05/2021 CLINICAL DATA:  Intraoperative right anterior hip replacement. EXAM: DG HIP (WITH OR WITHOUT PELVIS) 1V RIGHT COMPARISON:  CT abdomen and pelvis 09/02/2013 FINDINGS: Images were performed intraoperatively without the presence of a radiologist. New total right hip arthroplasty. No hardware complication is seen. Total fluoroscopy images: 2 Total fluoroscopy time: 9 seconds Total dose: Radiation Exposure Index (as provided by the fluoroscopic device): 1.378 mGy air Kerma Please see intraoperative findings for further detail. IMPRESSION: Intraoperative fluoroscopy for  total right hip arthroplasty. Electronically Signed   By: Yvonne Kendall M.D.   On: 11/05/2021 11:28   DG C-Arm 1-60 Min-No Report  Result Date: 11/05/2021 Fluoroscopy was utilized by the requesting physician.  No radiographic interpretation.    Disposition: Discharge disposition: 01-Home or Self Care       Discharge Instructions     Call MD / Call 911   Complete by: As directed    If you experience chest pain or shortness of breath, CALL 911 and be transported to the hospital emergency room.  If you develope a fever above 101 F, pus (white drainage) or increased drainage or redness at the wound, or calf pain, call your surgeon's office.   Change dressing   Complete by: As directed    Maintain surgical dressing until follow up in the clinic. If the edges start to pull up, may reinforce with tape. If the dressing is no longer working, may remove and cover with gauze and tape, but must keep the area dry and clean.  Call with any questions or concerns.   Constipation Prevention   Complete by: As directed    Drink plenty of fluids.  Prune juice may be helpful.  You may use a stool softener, such as Colace (over the counter) 100 mg twice a day.  Use MiraLax (over the counter) for constipation as needed.   Diet - low sodium heart healthy   Complete by: As directed    Increase activity slowly as tolerated   Complete by: As directed    Weight bearing as tolerated with assist device (walker, cane, etc) as directed, use it as long as suggested by your surgeon or therapist, typically at least 4-6 weeks.   Post-operative opioid taper instructions:   Complete by: As directed    POST-OPERATIVE OPIOID TAPER INSTRUCTIONS: It is important to wean off of your opioid medication as soon as possible. If you do not need pain medication after your surgery it is ok to stop day one. Opioids include: Codeine, Hydrocodone(Norco, Vicodin), Oxycodone(Percocet, oxycontin) and hydromorphone amongst others.  Long  term and even short term use of opiods can cause: Increased pain response Dependence  Constipation Depression Respiratory depression And more.  Withdrawal symptoms can include Flu like symptoms Nausea, vomiting And more Techniques to manage these symptoms Hydrate well Eat regular healthy meals Stay active Use relaxation techniques(deep breathing, meditating, yoga) Do Not substitute Alcohol to help with tapering If you have been on opioids for less than two weeks and do not have pain than it is ok to stop all together.  Plan to wean off of opioids This plan should start within one week post op of your joint replacement. Maintain the same interval or time between taking each dose and first decrease the dose.  Cut the total daily intake of opioids by one tablet each day Next start to increase the time between doses. The last dose that should be eliminated is the evening dose.      TED hose   Complete by: As directed    Use stockings (TED hose) for 2 weeks on both leg(s).  You may remove them at night for sleeping.        Follow-up Information     Paralee Cancel, MD. Go on 11/20/2021.   Specialty: Orthopedic Surgery Why: You are scheduled for a follow up appointment on 11-20-21 at 8:30 am. Contact information: [address] [place] [address] [phone]                  Signed: Irving Copas 11/10/2021, 10:43 AM

## 2021-12-09 ENCOUNTER — Other Ambulatory Visit: Payer: Self-pay

## 2021-12-09 MED ORDER — CLOPIDOGREL BISULFATE 75 MG PO TABS: 75.0000 mg | ORAL_TABLET | Freq: Every day | ORAL | 3 refills | Status: DC

## 2022-01-01 ENCOUNTER — Ambulatory Visit: Payer: Medicare Other | Admitting: Cardiology

## 2022-01-14 NOTE — Progress Notes (Deleted)
Cardiology Office Note:    Date:  01/14/2022   ID:  [name], DOB 1955-04-25, MRN [id number]  PCP:  Jani Gravel, MD   [place]  Cardiologist:  Freada Bergeron, MD  Advanced Practice Provider:  No care team member to display Electrophysiologist:  None    Referring MD: Jani Gravel, MD    History of Present Illness:    JENNALYNN RIVARD is a 66 y.o. female with a hx of CAD, HLD, HTN, RA, and tobacco abuse who was previously followed by Glean Hess now presenting to clinic for follow-up.  Her CAD dates back to 1996 when she had a stent placed in her RCA. 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 Cath showed a 75% distal LAD narrowing and this was managed conservatively with relook cath two days later showing 50% distal stenosis. She then had an inferior MI in 2003 with placement of overlapping Cypher drug eluting stents in the RCA. The LAD and Circumflex had mild disease per report. Four days later, she had severe chest pain, cath showed severe spasm of the LAD and Circumflex which resolved with IC vasodilators. Cath 2007 and showed patency of stents in the RCA and no other obstructive disease in the LAD and Circumflex. Remote stress test was in November 2009 and showed normal LVEF with no evidence of ischemia. She was seen in the ED 09/02/13 with bright red blood per rectum. Her ASA and Plavix were held. Colonoscopy 09/09/13 per Dr. Collene Mares with polyps removed but no other bleeding source identified. Aspirin was stopped.  She was lost to follow-up until August of 2016 where she saw Margarita Grizzle. Underwent myoview with no ischemia but EF depressed. Had repeat cath which showed Distal LAD SCAD healed, no LM disease, LAD is large without significant disease. RCA 30% proximal to mid atherosclerotic disease. Mid to distal RCA stents patent. LV gram with inferobasal akinesis. Was recommened for continued medical management. She was last seen by Cecille Rubin  02/01/20 she was doing well with no significant symptoms.   Was last seen in clinic on 02/2021 where she was grieving the loss of her husband. She was otherwise stable from a CV standpoint. TTE 03/2021 which showed LVEF 40-45% with basal to mid inferior and inferolateral hypokinesis, mild to moderate AR  Had right hip arthoplasty in 10/2021.  Today, ***   Past Medical History:  Diagnosis Date   Arthritis    rheumatoid arthritis, and osteoarthritis    Benign tumor of adrenal gland    CAD (coronary artery disease)    Inferior MI 1996 tx with bare metal stent RCA. Repeat  anterior MI 2002 with LAD vasospasm. Repeat inferior MI 2003 with occlusion RCA with overlapping Cypher DES in RCA. Repeat cath 2003 with profound vasospasm LAd and Circumflex. Repeat cath 2007  with patent RCA and no disease in LAD or Circumflex.   Depression    Environmental allergies    Family history of adverse reaction to anesthesia    mother had ? problem with anesthesia - was alcoholic - "mind was never right again"   Family history of breast cancer    Family history of leukemia    Family history of stomach cancer    Fibromyalgia    GERD (gastroesophageal reflux disease)    Heart murmur    History of kidney stones    History of stomach ulcers    due to taking aspirin for headaches   HTN (hypertension)  Hyperlipidemia    Myocardial infarction ([place])    times 4 "very minor" stents x2    Osteopenia    Osteoporosis    Rotator cuff tear    left   Sleep apnea    uses c pap - does not know settings    Urinary incontinence     Past Surgical History:  Procedure Laterality Date   ABDOMINAL HYSTERECTOMY     BACK SURGERY     CYSTOSCOPY WITH URETEROSCOPY AND STENT PLACEMENT Left 08/24/2019   Procedure: CYSTOSCOPY WITH DIAGNOSTIC URETEROSCOPY , REMOVAL OF STONES AND LEFT  STENT PLACEMENT;  Surgeon: Ardis Hughs, MD;  Location: [facility];  Service: Urology;  Laterality: Left;   LAPAROSCOPIC LYSIS OF ADHESIONS      LEFT HEART CATH AND CORONARY ANGIOGRAPHY N/A 11/16/2017   Procedure: LEFT HEART CATH AND CORONARY ANGIOGRAPHY;  Surgeon: Belva Crome, MD;  Location: [facility];  Service: Cardiovascular;  Laterality: N/A;   MYOMECTOMY     age 72   PELVIC ABCESS DRAINAGE     SHOULDER OPEN ROTATOR CUFF REPAIR Left 11/16/2014   Procedure: LEFT MINI OPEN ROTATOR CUFF REPAIR SHOULDER OPEN WITH SUBACROMIAL DECOMPRESSION;  Surgeon: Susa Day, MD;  Location: [facility];  Service: Orthopedics;  Laterality: Left;   TOTAL HIP ARTHROPLASTY Right 11/05/2021   Procedure: TOTAL HIP ARTHROPLASTY ANTERIOR APPROACH;  Surgeon: Paralee Cancel, MD;  Location: [facility];  Service: Orthopedics;  Laterality: Right;    Current Medications: No outpatient medications have been marked as taking for the 01/15/22 encounter (Appointment) with Freada Bergeron, MD.     Allergies:   Orencia [abatacept], Other, Tomato, Valium, and Enbrel [etanercept]   Social History   Socioeconomic History   Marital status: Widowed    Spouse name: Not on file   Number of children: 0   Years of education: 59   Highest education level: Not on file  Occupational History    Employer: [employer]  Tobacco Use   Smoking status: Former    Packs/day: 0.50    Types: Cigarettes    Passive exposure: Past   Smokeless tobacco: Never   Tobacco comments:    currently using e-cigs  Vaping Use   Vaping Use: Some days   Substances: Nicotine, Flavoring  Substance and Sexual Activity   Alcohol use: No   Drug use: No   Sexual activity: Not Currently    Partners: Male    Birth control/protection: Surgical    Comment: TAH  Other Topics Concern   Not on file  Social History Narrative   Not on file   Social Determinants of Health   Financial Resource Strain: Not on file  Food Insecurity: Not on file  Transportation Needs: Not on file  Physical Activity: Not on file  Stress: Not on file  Social Connections: Not on file     Family  History: The patient'sfamily history includes Alcohol abuse in her father and mother; Breast cancer (age of onset: 3) in her maternal grandmother; Breast cancer (age of onset: 50) in her cousin; Breast cancer (age of onset: 33) in her maternal aunt; Breast cancer (age of onset: 41) in her mother; Breast cancer (age of onset: 67) in her maternal aunt; Cancer (age of onset: 22) in her cousin; Cancer - Lung in her father; Leukemia (age of onset: 26) in her sister; Lung cancer in her father; Stomach cancer (age of onset: 67) in her paternal aunt.  ROS:   Please see the history of  present illness.    Review of Systems  Constitutional:  Positive for malaise/fatigue.  Respiratory:  Positive for shortness of breath.   Cardiovascular:  Negative for chest pain, palpitations, orthopnea, claudication, leg swelling and PND.  Gastrointestinal:  Negative for nausea and vomiting.  Musculoskeletal:  Negative for falls.  Neurological:  Negative for dizziness and loss of consciousness.  Psychiatric/Behavioral:  Positive for depression.      EKGs/Labs/Other Studies Reviewed:    The following studies were reviewed today: TTE 04-09-2021: IMPRESSIONS     1. Hypokinesis of the basal to mid inferior and inferolateral myocardium.  Left ventricular ejection fraction, by estimation, is 40 to 45%. The left  ventricle has mildly decreased function. The left ventricle demonstrates  regional wall motion  abnormalities (see scoring diagram/findings for description). There is  mild left ventricular hypertrophy of the basal-septal segment. Left  ventricular diastolic parameters are consistent with Grade I diastolic  dysfunction (impaired relaxation).   2. Right ventricular systolic function is normal. The right ventricular  size is normal. There is normal pulmonary artery systolic pressure.   3. Left atrial size was mildly dilated.   4. The mitral valve is normal in structure. Trivial mitral valve  regurgitation. No  evidence of mitral stenosis.   5. The aortic valve is tricuspid. Aortic valve regurgitation is mild to  moderate. No aortic stenosis is present. Aortic regurgitation PHT measures  797 msec.   6. The inferior vena cava is normal in size with greater than 50%  respiratory variability, suggesting right atrial pressure of 3 mmHg.  Cath 11/16/17: Review of prior images demonstrates distal LAD SCAD in December 2002 that healed spontaneously.  10 months later, October 2003, SCAD in distal RCA treated with stenting. Left main is normal LAD is large and wraps around the apex.  LAD is normal and previous site of SCAD 15 years ago is normal. Circumflex coronary artery is normal. RCA is widely patent.  Segmental 30% proximal to mid atherosclerotic disease.  Mid to distal RCA stents are widely patent.  The stents are in the distribution of previous SCAD October 2003. Inferobasal akinesis.  EF 45 to 50%.  Normal LVEDP.   RECOMMENDATIONS:   Continue aggressive primary prevention/risk factor modification: Monitor glycemic control, blood pressure less than 130/80 mmHg, LDL less than 70, smoking cessation, and at least 150 minutes of moderate aerobic activity per week Current symptoms not felt to represent ischemia/obstructive CAD.  TTE 2018: Study Conclusions   - Left ventricle: The cavity size was mildly dilated. Systolic    function was normal. The estimated ejection fraction was in the    range of 55% to 60%. Wall motion was normal; there were no    regional wall motion abnormalities. Features are consistent with    a pseudonormal left ventricular filling pattern, with concomitant    abnormal relaxation and increased filling pressure (grade 2    diastolic dysfunction). Doppler parameters are consistent with    high ventricular filling pressure.  - Aortic valve: There was mild regurgitation.  - Mitral valve: There was trivial regurgitation.  Myoview 09/25/16: Normal exercise treadmill stress test, no  ischemia. Normal exercise capacity. Normal BP response to stress.    EKG:   05/28/20-EKG was not ordered today.  02/22/21: ECG with SB with HR 57  Recent Labs: 11/06/2021: BUN 9; Creatinine, Ser 0.57; Hemoglobin 12.3; Platelets 190; Potassium 3.5; Sodium 142  Recent Lipid Panel    Component Value Date/Time   CHOL 167 02/01/2020 0857  TRIG 187 (H) 02/01/2020 0857   HDL 58 02/01/2020 0857   CHOLHDL 2.9 02/01/2020 0857   CHOLHDL 3.5 01/30/2016 1128   VLDL 71 (H) 01/30/2016 1128   LDLCALC 78 02/01/2020 0857     Risk Assessment/Calculations:       Physical Exam:    VS:  LMP 02/18/1995     Wt Readings from Last 3 Encounters:  11/05/21 169 lb (76.7 kg)  10/25/21 169 lb (76.7 kg)  10/07/21 169 lb 3.2 oz (76.7 kg)     GEN: Well nourished, well developed in no acute distress HEENT: Normal NECK: No JVD; No carotid bruits CARDIAC: RRR, 2/6 systolic murmur best heard at the RUSB. No rubs, gallops RESPIRATORY:  Clear to auscultation without rales, wheezing or rhonchi  ABDOMEN: Soft, non-tender, non-distended MUSCULOSKELETAL:  No edema; No deformity  SKIN: Warm and dry NEUROLOGIC:  Alert and oriented x 3 PSYCHIATRIC:  Normal affect   ASSESSMENT:    No diagnosis found.   PLAN:   In order of problems listed above:  #CAD: #History of SCAD: #Coronary Vasospasm Remote history of MI with PCI to dRCA, prior SCAD and vasospasm. Cath in 2019 with no significant obstructive; healed LAD SCAD, patent RCA stents. Has been managed on plavix for SCAD/RCA stents and imdur/dilt for vasospasm. Has intermittent chest pressure, but no significant symptoms and has not used nitro -Continue plavix '75mg'$  daily -Continue imdur '30mg'$  daily -Continue dilt '120mg'$  daily -Continue lisinopril '20mg'$  daily ??? Change to entresto -Continue metop '25mg'$  XL daily -Continue crestor '10mg'$  daily  #Chronic systolic HF with mildly reduced EF: EF 40-45% with inferior and inferolateral hypokinesis. Currently  euvolemic and compensated on exam.  -Continue lisinopril '20mg'$  daily -Continue metop '25mg'$  XL daily -Did not tolerate spiro due to fatigue  #HTN: *** -Continue spiro 12.'5mg'$  daily -Continue imdur '30mg'$  daily -Continue lisinopril '20mg'$  daily **** -Continue metop '25mg'$  XL daily -Continue dilt '120mg'$  daily -Keep BP log at home  #DOE:  Chronic. Cath in 2019 without significant obstructive disease as above. TTE with normal LVEF, mild AR and TR. -Continue management of CAD as above -Continue blood pressure control -TTE at next visit in 12month for monitoring of MR/TR  #HLD: LDL controlled at 45 at PCP office -Continue crestor '10mg'$  daily due to body aches -LDL goal <55  #Mild AR: #Mild TR: -Repeat TTE for serial monitoring  Follow in 6 months  Medication Adjustments/Labs and Tests Ordered: Current medicines are reviewed at length with the patient today.  Concerns regarding medicines are outlined above.  No orders of the defined types were placed in this encounter.  No orders of the defined types were placed in this encounter.   There are no Patient Instructions on file for this visit.     I,Alexis Bryant,acting as a sEducation administratorfor HFreada Bergeron MD.,have documented all relevant documentation on the behalf of HFreada Bergeron MD,as directed by  HFreada Bergeron MD while in the presence of HFreada Bergeron MD.  I, HFreada Bergeron MD, have reviewed all documentation for this visit. The documentation on 01/14/22 for the exam, diagnosis, procedures, and orders are all accurate and complete.  Signed, HFreada Bergeron MD  01/14/2022 1:23 PM    [place]

## 2022-01-15 ENCOUNTER — Ambulatory Visit: Payer: Medicare Other | Attending: Cardiology | Admitting: Cardiology

## 2022-01-15 ENCOUNTER — Encounter: Payer: Self-pay | Admitting: Cardiology

## 2022-01-15 ENCOUNTER — Encounter: Payer: Self-pay | Admitting: *Deleted

## 2022-01-15 ENCOUNTER — Ambulatory Visit: Payer: Medicare Other | Admitting: Cardiology

## 2022-01-15 VITALS — BP 123/64 | HR 86 | Ht 67.0 in | Wt 171.0 lb

## 2022-01-15 DIAGNOSIS — I10 Essential (primary) hypertension: Secondary | ICD-10-CM | POA: Insufficient documentation

## 2022-01-15 DIAGNOSIS — Z79899 Other long term (current) drug therapy: Secondary | ICD-10-CM | POA: Insufficient documentation

## 2022-01-15 DIAGNOSIS — I2542 Coronary artery dissection: Secondary | ICD-10-CM | POA: Insufficient documentation

## 2022-01-15 DIAGNOSIS — E785 Hyperlipidemia, unspecified: Secondary | ICD-10-CM | POA: Diagnosis present

## 2022-01-15 DIAGNOSIS — I2510 Atherosclerotic heart disease of native coronary artery without angina pectoris: Secondary | ICD-10-CM | POA: Diagnosis present

## 2022-01-15 DIAGNOSIS — I351 Nonrheumatic aortic (valve) insufficiency: Secondary | ICD-10-CM | POA: Diagnosis present

## 2022-01-15 DIAGNOSIS — I201 Angina pectoris with documented spasm: Secondary | ICD-10-CM | POA: Insufficient documentation

## 2022-01-15 DIAGNOSIS — I5022 Chronic systolic (congestive) heart failure: Secondary | ICD-10-CM | POA: Insufficient documentation

## 2022-01-15 MED ORDER — ENTRESTO 24-26 MG PO TABS: 1.0000 | ORAL_TABLET | Freq: Two times a day (BID) | ORAL | 3 refills | Status: DC

## 2022-01-15 NOTE — Progress Notes (Deleted)
Cardiology Office Note:    Date:  01/15/2022   ID:  Donna Bullock, DOB 08/20/1955, MRN [id number]  PCP:  Jani Gravel, MD   [place]  Cardiologist:  Freada Bergeron, MD  Advanced Practice Provider:  No care team member to display Electrophysiologist:  None    Referring MD: Jani Gravel, MD    History of Present Illness:    Donna Bullock is a 66 y.o. female with a hx of CAD, HLD, HTN, RA, and tobacco abuse who was previously followed by Glean Hess now presenting to clinic for follow-up.  Her CAD dates back to 1996 when she had a stent placed in her RCA. 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 Cath showed a 75% distal LAD narrowing and this was managed conservatively with relook cath two days later showing 50% distal stenosis. She then had an inferior MI in 2003 with placement of overlapping Cypher drug eluting stents in the RCA. The LAD and Circumflex had mild disease per report. Four days later, she had severe chest pain, cath showed severe spasm of the LAD and Circumflex which resolved with IC vasodilators. Cath 2007 and showed patency of stents in the RCA and no other obstructive disease in the LAD and Circumflex. Remote stress test was in November 2009 and showed normal LVEF with no evidence of ischemia. She was seen in the ED 09/02/13 with bright red blood per rectum. Her ASA and Plavix were held. Colonoscopy 09/09/13 per Dr. Collene Mares with polyps removed but no other bleeding source identified. Aspirin was stopped.  She was lost to follow-up until August of 2016 where she saw Margarita Grizzle. Underwent myoview with no ischemia but EF depressed. Had repeat cath which showed Distal LAD SCAD healed, no LM disease, LAD is large without significant disease. RCA 30% proximal to mid atherosclerotic disease. Mid to distal RCA stents patent. LV gram with inferobasal akinesis. Was recommened for continued medical management. She was last seen by Cecille Rubin  02/01/20 she was doing well with no significant symptoms.   Last seen by me on 05/28/20 where she was doing overall okay. Had chronic myalgias and DOE that were unchanged.   Today, the patient states that her husband passed away in 07-Aug-2020 and things have been incredibly difficult since that time. She has not been as motivated to walk as much, but states her DOE was improved when she was walking regularly. No lightheadedness, dizziness or syncope. Has been eating more salt and feels more fluid is on board. No palpitations. Blood pressure elevated today.   Past Medical History:  Diagnosis Date   Arthritis    rheumatoid arthritis, and osteoarthritis    Benign tumor of adrenal gland    CAD (coronary artery disease)    Inferior MI 1996 tx with bare metal stent RCA. Repeat  anterior MI 2002 with LAD vasospasm. Repeat inferior MI 2003 with occlusion RCA with overlapping Cypher DES in RCA. Repeat cath 2003 with profound vasospasm LAd and Circumflex. Repeat cath 2007  with patent RCA and no disease in LAD or Circumflex.   Depression    Environmental allergies    Family history of adverse reaction to anesthesia    mother had ? problem with anesthesia - was alcoholic - "mind was never right again"   Family history of breast cancer    Family history of leukemia    Family history of stomach cancer    Fibromyalgia    GERD (gastroesophageal  reflux disease)    Heart murmur    History of kidney stones    History of stomach ulcers    due to taking aspirin for headaches   HTN (hypertension)    Hyperlipidemia    Myocardial infarction ([place])    times 4 "very minor" stents x2    Osteopenia    Osteoporosis    Rotator cuff tear    left   Sleep apnea    uses c pap - does not know settings    Urinary incontinence     Past Surgical History:  Procedure Laterality Date   ABDOMINAL HYSTERECTOMY     BACK SURGERY     CYSTOSCOPY WITH URETEROSCOPY AND STENT PLACEMENT Left 08/24/2019   Procedure: CYSTOSCOPY  WITH DIAGNOSTIC URETEROSCOPY , REMOVAL OF STONES AND LEFT  STENT PLACEMENT;  Surgeon: Ardis Hughs, MD;  Location: [facility];  Service: Urology;  Laterality: Left;   LAPAROSCOPIC LYSIS OF ADHESIONS     LEFT HEART CATH AND CORONARY ANGIOGRAPHY N/A 11/16/2017   Procedure: LEFT HEART CATH AND CORONARY ANGIOGRAPHY;  Surgeon: Belva Crome, MD;  Location: [facility];  Service: Cardiovascular;  Laterality: N/A;   MYOMECTOMY     age 81   PELVIC ABCESS DRAINAGE     SHOULDER OPEN ROTATOR CUFF REPAIR Left 11/16/2014   Procedure: LEFT MINI OPEN ROTATOR CUFF REPAIR SHOULDER OPEN WITH SUBACROMIAL DECOMPRESSION;  Surgeon: Susa Day, MD;  Location: [facility];  Service: Orthopedics;  Laterality: Left;   TOTAL HIP ARTHROPLASTY Right 11/05/2021   Procedure: TOTAL HIP ARTHROPLASTY ANTERIOR APPROACH;  Surgeon: Paralee Cancel, MD;  Location: [facility];  Service: Orthopedics;  Laterality: Right;    Current Medications: No outpatient medications have been marked as taking for the 01/15/22 encounter (Appointment) with Freada Bergeron, MD.     Allergies:   Orencia [abatacept], Other, Tomato, Valium, and Enbrel [etanercept]   Social History   Socioeconomic History   Marital status: Widowed    Spouse name: Not on file   Number of children: 0   Years of education: 25   Highest education level: Not on file  Occupational History    Employer: [employer]  Tobacco Use   Smoking status: Former    Packs/day: 0.50    Types: Cigarettes    Passive exposure: Past   Smokeless tobacco: Never   Tobacco comments:    currently using e-cigs  Vaping Use   Vaping Use: Some days   Substances: Nicotine, Flavoring  Substance and Sexual Activity   Alcohol use: No   Drug use: No   Sexual activity: Not Currently    Partners: Male    Birth control/protection: Surgical    Comment: TAH  Other Topics Concern   Not on file  Social History Narrative   Not on file   Social Determinants of Health    Financial Resource Strain: Not on file  Food Insecurity: Not on file  Transportation Needs: Not on file  Physical Activity: Not on file  Stress: Not on file  Social Connections: Not on file     Family History: The patient'sfamily history includes Alcohol abuse in her father and mother; Breast cancer (age of onset: 88) in her maternal grandmother; Breast cancer (age of onset: 36) in her cousin; Breast cancer (age of onset: 93) in her maternal aunt; Breast cancer (age of onset: 48) in her mother; Breast cancer (age of onset: 84) in her maternal aunt; Cancer (age of onset: 67) in her cousin; Cancer -  Lung in her father; Leukemia (age of onset: 90) in her sister; Lung cancer in her father; Stomach cancer (age of onset: 67) in her paternal aunt.  ROS:   Please see the history of present illness.    Review of Systems  Constitutional:  Positive for malaise/fatigue.  Respiratory:  Positive for shortness of breath.   Cardiovascular:  Negative for chest pain, palpitations, orthopnea, claudication, leg swelling and PND.  Gastrointestinal:  Negative for nausea and vomiting.  Musculoskeletal:  Negative for falls.  Neurological:  Negative for dizziness and loss of consciousness.  Psychiatric/Behavioral:  Positive for depression.      EKGs/Labs/Other Studies Reviewed:    The following studies were reviewed today: Cath 11/16/17: Review of prior images demonstrates distal LAD SCAD in December 2002 that healed spontaneously.  10 months later, October 2003, SCAD in distal RCA treated with stenting. Left main is normal LAD is large and wraps around the apex.  LAD is normal and previous site of SCAD 15 years ago is normal. Circumflex coronary artery is normal. RCA is widely patent.  Segmental 30% proximal to mid atherosclerotic disease.  Mid to distal RCA stents are widely patent.  The stents are in the distribution of previous SCAD October 2003. Inferobasal akinesis.  EF 45 to 50%.  Normal LVEDP.    RECOMMENDATIONS:   Continue aggressive primary prevention/risk factor modification: Monitor glycemic control, blood pressure less than 130/80 mmHg, LDL less than 70, smoking cessation, and at least 150 minutes of moderate aerobic activity per week Current symptoms not felt to represent ischemia/obstructive CAD.  TTE 2018: Study Conclusions   - Left ventricle: The cavity size was mildly dilated. Systolic    function was normal. The estimated ejection fraction was in the    range of 55% to 60%. Wall motion was normal; there were no    regional wall motion abnormalities. Features are consistent with    a pseudonormal left ventricular filling pattern, with concomitant    abnormal relaxation and increased filling pressure (grade 2    diastolic dysfunction). Doppler parameters are consistent with    high ventricular filling pressure.  - Aortic valve: There was mild regurgitation.  - Mitral valve: There was trivial regurgitation.  Myoview 09/25/16: Normal exercise treadmill stress test, no ischemia. Normal exercise capacity. Normal BP response to stress.    EKG:   05/28/20-EKG was not ordered today.  02/22/21: ECG with SB with HR 57  Recent Labs: 11/06/2021: BUN 9; Creatinine, Ser 0.57; Hemoglobin 12.3; Platelets 190; Potassium 3.5; Sodium 142  Recent Lipid Panel    Component Value Date/Time   CHOL 167 02/01/2020 0857   TRIG 187 (H) 02/01/2020 0857   HDL 58 02/01/2020 0857   CHOLHDL 2.9 02/01/2020 0857   CHOLHDL 3.5 01/30/2016 1128   VLDL 71 (H) 01/30/2016 1128   LDLCALC 78 02/01/2020 0857     Risk Assessment/Calculations:       Physical Exam:    VS:  LMP 02/18/1995     Wt Readings from Last 3 Encounters:  11/05/21 169 lb (76.7 kg)  10/25/21 169 lb (76.7 kg)  10/07/21 169 lb 3.2 oz (76.7 kg)     GEN: Well nourished, well developed in no acute distress HEENT: Normal NECK: No JVD; No carotid bruits CARDIAC: RRR, 2/6 systolic murmur best heard at the RUSB. No rubs,  gallops RESPIRATORY:  Clear to auscultation without rales, wheezing or rhonchi  ABDOMEN: Soft, non-tender, non-distended MUSCULOSKELETAL:  No edema; No deformity  SKIN: Warm and dry  NEUROLOGIC:  Alert and oriented x 3 PSYCHIATRIC:  Normal affect   ASSESSMENT:    No diagnosis found.   PLAN:   In order of problems listed above:  #CAD: #History of SCAD: #Coronary Vasospasm Remote history of MI with PCI to dRCA, prior SCAD and vasospasm. Cath in 2019 with no significant obstructive; healed LAD SCAD, patent RCA stents. Has been managed on plavix for SCAD/RCA stents and imdur/dilt for vasospasm. Has intermittent chest pressure, but no significant symptoms and has not used nitro -Continue plavix '75mg'$  daily -Continue imdur '30mg'$  daily -Continue dilt '120mg'$  daily -Change lisinopril to entresto 24/'26mg'$  BID -Continue metop '25mg'$  XL daily -Continue crestor '10mg'$  daily -Off spiro due to fatigue  #Chronic Systolic HF: EF 85-27% with inferior hypokinesis.  #HTN: Well controlled at goal. -Off spiro due to fatigue -Continue imdur '30mg'$  daily -Change lisinopril to entresto 24/'26mg'$  BID -Continue metop '25mg'$  XL daily -Continue dilt '120mg'$  daily -Keep BP log at home  #DOE:  Chronic. Cath in 2019 without significant obstructive disease as above. TTE with normal LVEF, mild AR and TR. -Continue management of CAD as above -Continue blood pressure control -TTE at next visit in 13month for monitoring of MR/TR  #HLD: LDL controlled at 45 at PCP office -Continue crestor '10mg'$  daily due to body aches -Repeat lipids in 3 weeks for monitoring  #Mild -to-moderate AR: #Mild TR: -Monitor with serial echoes  Follow in 6 months  Medication Adjustments/Labs and Tests Ordered: Current medicines are reviewed at length with the patient today.  Concerns regarding medicines are outlined above.  No orders of the defined types were placed in this encounter.  No orders of the defined types were placed in this  encounter.   There are no Patient Instructions on file for this visit.     I,Alexis Bryant,acting as a sEducation administratorfor HFreada Bergeron MD.,have documented all relevant documentation on the behalf of HFreada Bergeron MD,as directed by  HFreada Bergeron MD while in the presence of HFreada Bergeron MD.  I, HFreada Bergeron MD, have reviewed all documentation for this visit. The documentation on 01/15/22 for the exam, diagnosis, procedures, and orders are all accurate and complete.  Signed, HFreada Bergeron MD  01/15/2022 12:13 PM    [facility]

## 2022-01-15 NOTE — Progress Notes (Signed)
Cardiology Office Note:    Date:  01/15/2022   ID:  Donna Bullock, DOB [birth year], MRN [id number]  PCP:  Jani Gravel, MD   [place]  Cardiologist:  Freada Bergeron, MD  Advanced Practice Provider:  No care team member to display Electrophysiologist:  None    Referring MD: Jani Gravel, MD    History of Present Illness:    Donna Bullock is a 66 y.o. female with a hx of CAD, HLD, HTN, RA, and tobacco abuse who was previously followed by Glean Hess now presenting to clinic for follow-up.  Her CAD dates back to 1996 when she had a stent placed in her RCA. 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 Cath showed a 75% distal LAD narrowing and this was managed conservatively with relook cath two days later showing 50% distal stenosis. She then had an inferior MI in 2003 with placement of overlapping Cypher drug eluting stents in the RCA. The LAD and Circumflex had mild disease per report. Four days later, she had severe chest pain, cath showed severe spasm of the LAD and Circumflex which resolved with IC vasodilators. Cath 2007 and showed patency of stents in the RCA and no other obstructive disease in the LAD and Circumflex. Remote stress test was in November 2009 and showed normal LVEF with no evidence of ischemia. She was seen in the ED 09/02/13 with bright red blood per rectum. Her ASA and Plavix were held. Colonoscopy 09/09/13 per Dr. Collene Mares with polyps removed but no other bleeding source identified. Aspirin was stopped.  She was lost to follow-up until August of 2016 where she saw Margarita Grizzle. Underwent myoview with no ischemia but EF depressed. Had repeat cath which showed Distal LAD SCAD healed, no LM disease, LAD is large without significant disease. RCA 30% proximal to mid atherosclerotic disease. Mid to distal RCA stents patent. LV gram with inferobasal akinesis. Was recommened for continued medical management. She was last seen by Cecille Rubin  02/01/20 she was doing well with no significant symptoms.   She was seen by me on 05/28/20 where she was doing overall okay. Had chronic myalgias and DOE that were unchanged.   At her last visit with me on 02/22/2021, the patient stated that her husband passed away in 31-Jul-2020 and things had been incredibly difficult since that time. She was not motivated to walk as much, but her DOE was improved when she was walking regularly. TTE 03/2021 showed EF 40-45% with inferior and inferolateral hypokinesis, mild to moderate AR. EF on cath in 2019 45-50%.  Today, she is doing well overall form a CV perspective. She is 9 weeks s/p THA. She states that her hip pain was severe for 5 weeks but has significantly improved. She is walking but still with some pain. She reports that she had no heart complications with surgery.  She states that she stopped taking the Aldactone as it made her fatigued. Her BP has been between 120-140 over 80-90 at home. She states that she is able to improve her BP with relaxation and deep breathing.  She is doing somewhat better since her husband's passing. However, she notes that her diet is carb heavy as this has been her main coping mechanism.  No orthopnea, chest pain, LE edema, palpitations, or PND.  Past Medical History:  Diagnosis Date   Arthritis    rheumatoid arthritis, and osteoarthritis    Benign tumor of adrenal gland  CAD (coronary artery disease)    Inferior MI 1996 tx with bare metal stent RCA. Repeat  anterior MI 2002 with LAD vasospasm. Repeat inferior MI 2003 with occlusion RCA with overlapping Cypher DES in RCA. Repeat cath 2003 with profound vasospasm LAd and Circumflex. Repeat cath 2007  with patent RCA and no disease in LAD or Circumflex.   Depression    Environmental allergies    Family history of adverse reaction to anesthesia    mother had ? problem with anesthesia - was alcoholic - "mind was never right again"   Family history of breast cancer     Family history of leukemia    Family history of stomach cancer    Fibromyalgia    GERD (gastroesophageal reflux disease)    Heart murmur    History of kidney stones    History of stomach ulcers    due to taking aspirin for headaches   HTN (hypertension)    Hyperlipidemia    Myocardial infarction ([place])    times 4 "very minor" stents x2    Osteopenia    Osteoporosis    Rotator cuff tear    left   Sleep apnea    uses c pap - does not know settings    Urinary incontinence     Past Surgical History:  Procedure Laterality Date   ABDOMINAL HYSTERECTOMY     BACK SURGERY     CYSTOSCOPY WITH URETEROSCOPY AND STENT PLACEMENT Left 08/24/2019   Procedure: CYSTOSCOPY WITH DIAGNOSTIC URETEROSCOPY , REMOVAL OF STONES AND LEFT  STENT PLACEMENT;  Surgeon: Ardis Hughs, MD;  Location: [facility];  Service: Urology;  Laterality: Left;   LAPAROSCOPIC LYSIS OF ADHESIONS     LEFT HEART CATH AND CORONARY ANGIOGRAPHY N/A 11/16/2017   Procedure: LEFT HEART CATH AND CORONARY ANGIOGRAPHY;  Surgeon: Belva Crome, MD;  Location: [facility];  Service: Cardiovascular;  Laterality: N/A;   MYOMECTOMY     age 38   PELVIC ABCESS DRAINAGE     SHOULDER OPEN ROTATOR CUFF REPAIR Left 11/16/2014   Procedure: LEFT MINI OPEN ROTATOR CUFF REPAIR SHOULDER OPEN WITH SUBACROMIAL DECOMPRESSION;  Surgeon: Susa Day, MD;  Location: [facility];  Service: Orthopedics;  Laterality: Left;   TOTAL HIP ARTHROPLASTY Right 11/05/2021   Procedure: TOTAL HIP ARTHROPLASTY ANTERIOR APPROACH;  Surgeon: Paralee Cancel, MD;  Location: [facility];  Service: Orthopedics;  Laterality: Right;    Current Medications: Current Meds  Medication Sig   Artificial Tear Solution (SOOTHE XP) SOLN Place 1 drop into both eyes daily as needed (dry eyes).   b complex vitamins tablet Take 1 tablet by mouth daily.   bevacizumab (AVASTIN) 2.5 mg/0.1 mL SOLN intravitreal injection 0.625 mg by Intravitreal route once.   Calcium Citrate-Vitamin D  (CITRACAL + D PO) Take 1 tablet by mouth 2 (two) times daily.   clopidogrel (PLAVIX) 75 MG tablet Take 1 tablet (75 mg total) by mouth daily.   diltiazem (DILTIAZEM CD) 120 MG 24 hr capsule Take 1 capsule (120 mg total) by mouth daily.   diphenhydrAMINE (BENADRYL) 25 MG tablet Take 25 mg by mouth daily as needed for allergies.    docusate sodium (COLACE) 100 MG capsule Take 1 capsule (100 mg total) by mouth 2 (two) times daily.   escitalopram (LEXAPRO) 10 MG tablet Take 1 tablet (10 mg total) by mouth daily.   fluticasone (FLONASE) 50 MCG/ACT nasal spray Place 2 sprays into both nostrils at bedtime.   golimumab ([place] ARIA) 50  MG/4ML SOLN injection Inject 50 mg into the vein every 3 (three) months.   golimumab 2 mg/kg in sodium chloride 0.9 % Inject 2 mg/kg into the vein every 8 (eight) weeks.   isosorbide mononitrate (IMDUR) 30 MG 24 hr tablet Take 0.5 tablets (15 mg total) by mouth at bedtime.   methocarbamol (ROBAXIN) 500 MG tablet Take 1 tablet (500 mg total) by mouth every 6 (six) hours as needed for muscle spasms (muscle pain).   metoprolol succinate (TOPROL-XL) 25 MG 24 hr tablet Take 1 tablet (25 mg total) by mouth daily.   montelukast (SINGULAIR) 10 MG tablet Take 10 mg by mouth at bedtime.   Multiple Vitamins-Minerals (CENTRUM SILVER PO) Take 1 tablet by mouth daily.    Multiple Vitamins-Minerals (PRESERVISION AREDS 2) CAPS Take 1 capsule by mouth 2 (two) times daily.   MYRBETRIQ 50 MG TB24 tablet Take 50 mg by mouth daily.   omeprazole (PRILOSEC) 20 MG capsule Take 1 capsule (20 mg total) by mouth daily.   polyethylene glycol (MIRALAX / GLYCOLAX) 17 g packet Take 17 g by mouth daily as needed for mild constipation.   rosuvastatin (CRESTOR) 10 MG tablet Take 1 tablet (10 mg total) by mouth daily.   sacubitril-valsartan (ENTRESTO) 24-26 MG Take 1 tablet by mouth 2 (two) times daily.   valACYclovir (VALTREX) 500 MG tablet TAKE 1 TABLET BY MOUTH TWICE A DAY FOR 3 DAYS AS NEEDED    [DISCONTINUED] lisinopril (ZESTRIL) 20 MG tablet Take 1 tablet (20 mg total) by mouth daily.     Allergies:   Orencia [abatacept], Other, Tomato, Valium, and Enbrel [etanercept]   Social History   Socioeconomic History   Marital status: Widowed    Spouse name: Not on file   Number of children: 0   Years of education: 3   Highest education level: Not on file  Occupational History    Employer: [employer]  Tobacco Use   Smoking status: Former    Packs/day: 0.50    Types: Cigarettes    Passive exposure: Past   Smokeless tobacco: Never   Tobacco comments:    currently using e-cigs  Vaping Use   Vaping Use: Some days   Substances: Nicotine, Flavoring  Substance and Sexual Activity   Alcohol use: No   Drug use: No   Sexual activity: Not Currently    Partners: Male    Birth control/protection: Surgical    Comment: TAH  Other Topics Concern   Not on file  Social History Narrative   Not on file   Social Determinants of Health   Financial Resource Strain: Not on file  Food Insecurity: Not on file  Transportation Needs: Not on file  Physical Activity: Not on file  Stress: Not on file  Social Connections: Not on file     Family History: The patient'sfamily history includes Alcohol abuse in her father and mother; Breast cancer (age of onset: 73) in her maternal grandmother; Breast cancer (age of onset: 77) in her cousin; Breast cancer (age of onset: 36) in her maternal aunt; Breast cancer (age of onset: 53) in her mother; Breast cancer (age of onset: 67) in her maternal aunt; Cancer (age of onset: 57) in her cousin; Cancer - Lung in her father; Leukemia (age of onset: 55) in her sister; Lung cancer in her father; Stomach cancer (age of onset: 25) in her paternal aunt.  ROS:   Please see the history of present illness.    Review of Systems  Constitutional:  Negative for chills and fever.  HENT:  Negative for nosebleeds and tinnitus.   Eyes:  Negative for blurred vision  and pain.  Respiratory:  Negative for cough, hemoptysis, shortness of breath and stridor.   Cardiovascular:  Negative for chest pain, palpitations, orthopnea, claudication, leg swelling and PND.  Gastrointestinal:  Negative for blood in stool, diarrhea, nausea and vomiting.  Genitourinary:  Negative for dysuria and hematuria.  Musculoskeletal:  Positive for joint pain. Negative for falls.  Neurological:  Negative for dizziness, loss of consciousness and headaches.  Psychiatric/Behavioral:  Negative for depression, hallucinations and substance abuse. The patient does not have insomnia.      EKGs/Labs/Other Studies Reviewed:    The following studies were reviewed today:  Echo 03/22/2021: 1. Hypokinesis of the basal to mid inferior and inferolateral myocardium.  Left ventricular ejection fraction, by estimation, is 40 to 45%. The left  ventricle has mildly decreased function. The left ventricle demonstrates  regional wall motion  abnormalities (see scoring diagram/findings for description). There is  mild left ventricular hypertrophy of the basal-septal segment. Left  ventricular diastolic parameters are consistent with Grade I diastolic  dysfunction (impaired relaxation).   2. Right ventricular systolic function is normal. The right ventricular  size is normal. There is normal pulmonary artery systolic pressure.   3. Left atrial size was mildly dilated.   4. The mitral valve is normal in structure. Trivial mitral valve  regurgitation. No evidence of mitral stenosis.   5. The aortic valve is tricuspid. Aortic valve regurgitation is mild to  moderate. No aortic stenosis is present. Aortic regurgitation PHT measures  797 msec.   6. The inferior vena cava is normal in size with greater than 50%  respiratory variability, suggesting right atrial pressure of 3 mmHg.   Cath 11/16/17: Review of prior images demonstrates distal LAD SCAD in December 2002 that healed spontaneously.  10 months  later, October 2003, SCAD in distal RCA treated with stenting. Left main is normal LAD is large and wraps around the apex.  LAD is normal and previous site of SCAD 15 years ago is normal. Circumflex coronary artery is normal. RCA is widely patent.  Segmental 30% proximal to mid atherosclerotic disease.  Mid to distal RCA stents are widely patent.  The stents are in the distribution of previous SCAD October 2003. Inferobasal akinesis.  EF 45 to 50%.  Normal LVEDP.   RECOMMENDATIONS:   Continue aggressive primary prevention/risk factor modification: Monitor glycemic control, blood pressure less than 130/80 mmHg, LDL less than 70, smoking cessation, and at least 150 minutes of moderate aerobic activity per week Current symptoms not felt to represent ischemia/obstructive CAD.  TTE 2018: Study Conclusions   - Left ventricle: The cavity size was mildly dilated. Systolic    function was normal. The estimated ejection fraction was in the    range of 55% to 60%. Wall motion was normal; there were no    regional wall motion abnormalities. Features are consistent with    a pseudonormal left ventricular filling pattern, with concomitant    abnormal relaxation and increased filling pressure (grade 2    diastolic dysfunction). Doppler parameters are consistent with    high ventricular filling pressure.  - Aortic valve: There was mild regurgitation.  - Mitral valve: There was trivial regurgitation.  Myoview 09/25/16: Normal exercise treadmill stress test, no ischemia. Normal exercise capacity. Normal BP response to stress.    EKG:  EKG was not ordered today, 01/15/22. 05/28/20: EKG was not ordered  02/22/21: EKG with SB with HR 57  Recent Labs: 11/06/2021: BUN 9; Creatinine, Ser 0.57; Hemoglobin 12.3; Platelets 190; Potassium 3.5; Sodium 142  Recent Lipid Panel    Component Value Date/Time   CHOL 167 02/01/2020 0857   TRIG 187 (H) 02/01/2020 0857   HDL 58 02/01/2020 0857   CHOLHDL 2.9  02/01/2020 0857   CHOLHDL 3.5 01/30/2016 1128   VLDL 71 (H) 01/30/2016 1128   LDLCALC 78 02/01/2020 0857     Risk Assessment/Calculations:       Physical Exam:    VS:  BP 123/64   Pulse 86   Ht '5\' 7"'$  (1.702 m)   Wt 171 lb (77.6 kg)   LMP 02/18/1995   SpO2 94%   BMI 26.78 kg/m     Wt Readings from Last 3 Encounters:  01/15/22 171 lb (77.6 kg)  11/05/21 169 lb (76.7 kg)  10/25/21 169 lb (76.7 kg)     GEN: Well nourished, well developed in no acute distress HEENT: Normal NECK: No JVD; No carotid bruits CARDIAC: RRR, 2/6 systolic murmur best heard at the RUSB. No rubs, gallops RESPIRATORY:  Clear to auscultation without rales, wheezing or rhonchi  ABDOMEN: Soft, non-tender, non-distended MUSCULOSKELETAL:  No edema; No deformity  SKIN: Warm and dry NEUROLOGIC:  Alert and oriented x 3 PSYCHIATRIC:  Normal affect   ASSESSMENT:    1. Coronary artery disease involving native coronary artery of native heart without angina pectoris   2. Medication management   3. Chronic systolic heart failure ([place])   4. Hyperlipidemia LDL goal <70   5. Hyperlipidemia, unspecified hyperlipidemia type   6. Essential hypertension   7. 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 Aortic valve insufficiency, etiology of cardiac valve disease unspecified   9. Spontaneous dissection of coronary artery      PLAN:   In order of problems listed above:  #CAD: #History of SCAD: #Coronary Vasospasm Remote history of MI with PCI to dRCA, prior SCAD and vasospasm. Cath in 2019 with no significant obstructive; healed LAD SCAD, patent RCA stents. Has been managed on plavix for SCAD/RCA stents and imdur/dilt for vasospasm. Has intermittent chest pressure, but no significant symptoms and has not used nitro -Continue plavix '75mg'$  daily -Continue imdur '30mg'$  daily -Continue dilt '120mg'$  daily -Change lisinopril to entresto 24/'26mg'$  BID -Continue metop '25mg'$  XL daily -Continue crestor '10mg'$  daily -Off spiro due to  fatigue   #Chronic Systolic HF: EF 84-16% with inferior and inferolateral hypokinesis. EF in 2019 on cath 45-50%. Suspect this is secondary to RCA infarct, however, has not had recent ischemic work-up. Will check myoview and optimize meds as below. Currently, she is compensated and euvolemic on exam. -Check myoview -Change lisinopril to entresto 24/'26mg'$  BID -Continue metop '25mg'$  XL daily -Did not tolerate spiro due to fatigue -Can trial farxiga/jardiance at next visit  -Check BMET once on entresto   #HTN: Well controlled at goal. -Off spiro due to fatigue -Continue imdur '30mg'$  daily -Change lisinopril to entresto 24/'26mg'$  BID -Continue metop '25mg'$  XL daily -Continue dilt '120mg'$  daily -Keep BP log at home   #HLD: -Continue crestor '10mg'$  daily -Repeat lipids in 3 weeks for monitoring -Goal LDL<70   #Mild -to-moderate AR: #Mild TR: -Monitor with serial echoes  Follow in 6 months  Medication Adjustments/Labs and Tests Ordered: Current medicines are reviewed at length with the patient today.  Concerns regarding medicines are outlined above.  Orders Placed This Encounter  Procedures   Lipid Profile   Basic metabolic panel  MYOCARDIAL PERFUSION IMAGING   Meds ordered this encounter  Medications   sacubitril-valsartan (ENTRESTO) 24-26 MG    Sig: Take 1 tablet by mouth 2 (two) times daily.    Dispense:  60 tablet    Refill:  3    Patient Instructions  Medication Instructions:   STOP TAKING LISINOPRIL--MAKE SURE YOU DO A 2 DAY WASHOUT OF THIS MEDICATION BEFORE STARTING ENTRESTO  START ENTRESTO 24/26 MG STRENGTH--TAKE 1 TABLET BY MOUTH TWICE DAILY--DO NOT START THIS UNTIL YOU HAVE DONE A 2 DAY WASHOUT OF YOUR LISINOPRIL  *If you need a refill on your cardiac medications before your next appointment, please call your pharmacy*   Lab Work:  IN 3 WEEKS HERE IN THE OFFICE--CHECK BMET AND LIPIDS--PLEASE COME FASTING TO THIS LAB APPOINTMENT  If you have labs (blood work) drawn  today and your tests are completely normal, you will receive your results only by: MyChart Message (if you have MyChart) OR A paper copy in the mail If you have any lab test that is abnormal or we need to change your treatment, we will call you to review the results.   Testing/Procedures:  Your physician has requested that you have a lexiscan myoview. For further information please visit [email or web address]. Please follow instruction sheet, as given.    Follow-Up: At [facility], you and your health needs are our priority.  As part of our continuing mission to provide you with exceptional heart care, we have created designated Provider Care Teams.  These Care Teams include your primary Cardiologist (physician) and Advanced Practice Providers (APPs -  Physician Assistants and Nurse Practitioners) who all work together to provide you with the care you need, when you need it.  We recommend signing up for the patient portal called "MyChart".  Sign up information is provided on this After Visit Summary.  MyChart is used to connect with patients for Virtual Visits (Telemedicine).  Patients are able to view lab/test results, encounter notes, upcoming appointments, etc.  Non-urgent messages can be sent to your provider as well.   To learn more about what you can do with MyChart, go to [email or web address].    Your next appointment:   6 month(s)  The format for your next appointment:   In Person  Provider:   Freada Bergeron, MD     Important Information About Sugar         I,Alexis Herring,acting as a scribe for Freada Bergeron, MD.,have documented all relevant documentation on the behalf of Freada Bergeron, MD,as directed by  Freada Bergeron, MD while in the presence of Freada Bergeron, MD.  I, Freada Bergeron, MD, have reviewed all documentation for this visit. The documentation on 01/15/22 for the exam, diagnosis, procedures, and orders are all  accurate and complete.   Signed, Freada Bergeron, MD  01/15/2022 2:07 PM    [facility]

## 2022-01-15 NOTE — Patient Instructions (Signed)
Medication Instructions:   STOP TAKING LISINOPRIL--MAKE SURE YOU DO A 2 DAY WASHOUT OF THIS MEDICATION BEFORE STARTING ENTRESTO  START ENTRESTO 24/26 MG STRENGTH--TAKE 1 TABLET BY MOUTH TWICE DAILY--DO NOT START THIS UNTIL YOU HAVE DONE A 2 DAY WASHOUT OF YOUR LISINOPRIL  *If you need a refill on your cardiac medications before your next appointment, please call your pharmacy*   Lab Work:  IN 3 WEEKS HERE IN THE OFFICE--CHECK BMET AND LIPIDS--PLEASE COME FASTING TO THIS LAB APPOINTMENT  If you have labs (blood work) drawn today and your tests are completely normal, you will receive your results only by: MyChart Message (if you have MyChart) OR A paper copy in the mail If you have any lab test that is abnormal or we need to change your treatment, we will call you to review the results.   Testing/Procedures:  Your physician has requested that you have a lexiscan myoview. For further information please visit [email or web address]. Please follow instruction sheet, as given.    Follow-Up: At [facility], you and your health needs are our priority.  As part of our continuing mission to provide you with exceptional heart care, we have created designated Provider Care Teams.  These Care Teams include your primary Cardiologist (physician) and Advanced Practice Providers (APPs -  Physician Assistants and Nurse Practitioners) who all work together to provide you with the care you need, when you need it.  We recommend signing up for the patient portal called "MyChart".  Sign up information is provided on this After Visit Summary.  MyChart is used to connect with patients for Virtual Visits (Telemedicine).  Patients are able to view lab/test results, encounter notes, upcoming appointments, etc.  Non-urgent messages can be sent to your provider as well.   To learn more about what you can do with MyChart, go to [email or web address].    Your next appointment:   6 month(s)  The format for  your next appointment:   In Person  Provider:   Freada Bergeron, MD     Important Information About Sugar

## 2022-01-29 ENCOUNTER — Telehealth (HOSPITAL_COMMUNITY): Payer: Self-pay | Admitting: *Deleted

## 2022-01-29 NOTE — Telephone Encounter (Signed)
Left message on voicemail per DPR in reference to upcoming appointment scheduled on 02/05/22 with detailed instructions given per Myocardial Perfusion Study Information Sheet for the test. LM to arrive 15 minutes early, and that it is imperative to arrive on time for appointment to keep from having the test rescheduled. If you need to cancel or reschedule your appointment, please call the office within 24 hours of your appointment. Failure to do so may result in a cancellation of your appointment, and a $50 no show fee. Phone number given for call back for any questions. Dason Mosley Jacqueline   

## 2022-02-05 ENCOUNTER — Ambulatory Visit (HOSPITAL_BASED_OUTPATIENT_CLINIC_OR_DEPARTMENT_OTHER): Payer: Medicare Other

## 2022-02-05 ENCOUNTER — Ambulatory Visit: Payer: Medicare Other | Attending: Cardiology

## 2022-02-05 DIAGNOSIS — I5022 Chronic systolic (congestive) heart failure: Secondary | ICD-10-CM | POA: Insufficient documentation

## 2022-02-05 DIAGNOSIS — Z79899 Other long term (current) drug therapy: Secondary | ICD-10-CM | POA: Insufficient documentation

## 2022-02-05 DIAGNOSIS — E785 Hyperlipidemia, unspecified: Secondary | ICD-10-CM | POA: Insufficient documentation

## 2022-02-05 DIAGNOSIS — I2510 Atherosclerotic heart disease of native coronary artery without angina pectoris: Secondary | ICD-10-CM | POA: Diagnosis not present

## 2022-02-05 LAB — BASIC METABOLIC PANEL
BUN/Creatinine Ratio: 10 — ABNORMAL LOW (ref 12–28)
BUN: 8 mg/dL (ref 8–27)
CO2: 25 mmol/L (ref 20–29)
Calcium: 10.1 mg/dL (ref 8.7–10.3)
Chloride: 104 mmol/L (ref 96–106)
Creatinine, Ser: 0.84 mg/dL (ref 0.57–1.00)
Glucose: 101 mg/dL — ABNORMAL HIGH (ref 70–99)
Potassium: 4.2 mmol/L (ref 3.5–5.2)
Sodium: 143 mmol/L (ref 134–144)
eGFR: 76 mL/min/{1.73_m2} (ref >59)

## 2022-02-05 LAB — LIPID PANEL
Chol/HDL Ratio: 2.5 ratio (ref 0.0–4.4)
Cholesterol, Total: 154 mg/dL (ref 100–199)
HDL: 62 mg/dL (ref >39)
LDL Chol Calc (NIH): 58 mg/dL (ref 0–99)
Triglycerides: 214 mg/dL — ABNORMAL HIGH (ref 0–149)
VLDL Cholesterol Cal: 34 mg/dL (ref 5–40)

## 2022-02-05 LAB — MYOCARDIAL PERFUSION IMAGING
LV dias vol: 139 mL (ref 46–106)
LV sys vol: 82 mL
Nuc Stress EF: 41 %
Peak HR: 98 {beats}/min
Rest HR: 52 {beats}/min
Rest Nuclear Isotope Dose: 9.9 mCi
SDS: 1
SRS: 7
SSS: 9
ST Depression (mm): 0 mm
Stress Nuclear Isotope Dose: 30.7 mCi
TID: 1.03

## 2022-02-05 MED ORDER — TECHNETIUM TC 99M TETROFOSMIN IV KIT
9.9000 | PACK | Freq: Once | INTRAVENOUS | Status: AC | PRN
  Administered 2022-02-05: 9.9 via INTRAVENOUS

## 2022-02-05 MED ORDER — TECHNETIUM TC 99M TETROFOSMIN IV KIT
30.7000 | PACK | Freq: Once | INTRAVENOUS | Status: AC | PRN
  Administered 2022-02-05: 30.7 via INTRAVENOUS

## 2022-02-05 MED ORDER — REGADENOSON 0.4 MG/5ML IV SOLN
0.4000 mg | Freq: Once | INTRAVENOUS | Status: AC
  Administered 2022-02-05: 0.4 mg via INTRAVENOUS

## 2022-02-24 ENCOUNTER — Emergency Department (HOSPITAL_BASED_OUTPATIENT_CLINIC_OR_DEPARTMENT_OTHER)
Admission: EM | Admit: 2022-02-24 | Discharge: 2022-02-24 | Disposition: A | Discharge status: Home / Self Care | Payer: Medicare Other | Attending: Emergency Medicine | Admitting: Emergency Medicine

## 2022-02-24 ENCOUNTER — Other Ambulatory Visit: Payer: Self-pay

## 2022-02-24 ENCOUNTER — Emergency Department (HOSPITAL_BASED_OUTPATIENT_CLINIC_OR_DEPARTMENT_OTHER): Payer: Medicare Other

## 2022-02-24 ENCOUNTER — Encounter (HOSPITAL_BASED_OUTPATIENT_CLINIC_OR_DEPARTMENT_OTHER): Payer: Self-pay | Admitting: *Deleted

## 2022-02-24 DIAGNOSIS — K219 Gastro-esophageal reflux disease without esophagitis: Secondary | ICD-10-CM | POA: Diagnosis not present

## 2022-02-24 DIAGNOSIS — Z79899 Other long term (current) drug therapy: Secondary | ICD-10-CM | POA: Insufficient documentation

## 2022-02-24 DIAGNOSIS — R109 Unspecified abdominal pain: Secondary | ICD-10-CM | POA: Diagnosis not present

## 2022-02-24 DIAGNOSIS — R112 Nausea with vomiting, unspecified: Secondary | ICD-10-CM | POA: Insufficient documentation

## 2022-02-24 DIAGNOSIS — M545 Low back pain, unspecified: Secondary | ICD-10-CM | POA: Insufficient documentation

## 2022-02-24 DIAGNOSIS — Z20822 Contact with and (suspected) exposure to covid-19: Secondary | ICD-10-CM | POA: Diagnosis not present

## 2022-02-24 DIAGNOSIS — I10 Essential (primary) hypertension: Secondary | ICD-10-CM | POA: Diagnosis not present

## 2022-02-24 DIAGNOSIS — R61 Generalized hyperhidrosis: Secondary | ICD-10-CM | POA: Insufficient documentation

## 2022-02-24 DIAGNOSIS — M542 Cervicalgia: Secondary | ICD-10-CM | POA: Insufficient documentation

## 2022-02-24 LAB — COMPREHENSIVE METABOLIC PANEL
ALT: 18 U/L (ref 0–44)
AST: 25 U/L (ref 15–41)
Albumin: 4.9 g/dL (ref 3.5–5.0)
Alkaline Phosphatase: 83 U/L (ref 38–126)
Anion gap: 17 — ABNORMAL HIGH (ref 5–15)
BUN: 12 mg/dL (ref 8–23)
CO2: 23 mmol/L (ref 22–32)
Calcium: 10.1 mg/dL (ref 8.9–10.3)
Chloride: 98 mmol/L (ref 98–111)
Creatinine, Ser: 0.99 mg/dL (ref 0.44–1.00)
GFR, Estimated: >60 mL/min (ref >60)
Glucose, Bld: 147 mg/dL — ABNORMAL HIGH (ref 70–99)
Potassium: 3.6 mmol/L (ref 3.5–5.1)
Sodium: 138 mmol/L (ref 135–145)
Total Bilirubin: 0.8 mg/dL (ref 0.3–1.2)
Total Protein: 8.4 g/dL — ABNORMAL HIGH (ref 6.5–8.1)

## 2022-02-24 LAB — RESP PANEL BY RT-PCR (RSV, FLU A&B, COVID)  RVPGX2
Influenza A by PCR: NEGATIVE
Influenza B by PCR: NEGATIVE
Resp Syncytial Virus by PCR: NEGATIVE
SARS Coronavirus 2 by RT PCR: NEGATIVE

## 2022-02-24 LAB — CBC WITH DIFFERENTIAL/PLATELET
Abs Immature Granulocytes: 0.03 10*3/uL (ref 0.00–0.07)
Basophils Absolute: 0 10*3/uL (ref 0.0–0.1)
Basophils Relative: 0 %
Eosinophils Absolute: 0 10*3/uL (ref 0.0–0.5)
Eosinophils Relative: 0 %
HCT: 48.2 % — ABNORMAL HIGH (ref 36.0–46.0)
Hemoglobin: 16.1 g/dL — ABNORMAL HIGH (ref 12.0–15.0)
Immature Granulocytes: 0 %
Lymphocytes Relative: 16 %
Lymphs Abs: 1.7 10*3/uL (ref 0.7–4.0)
MCH: 29.1 pg (ref 26.0–34.0)
MCHC: 33.4 g/dL (ref 30.0–36.0)
MCV: 87.2 fL (ref 80.0–100.0)
Monocytes Absolute: 0.5 10*3/uL (ref 0.1–1.0)
Monocytes Relative: 5 %
Neutro Abs: 8 10*3/uL — ABNORMAL HIGH (ref 1.7–7.7)
Neutrophils Relative %: 79 %
Platelets: 252 10*3/uL (ref 150–400)
RBC: 5.53 MIL/uL — ABNORMAL HIGH (ref 3.87–5.11)
RDW: 14.3 % (ref 11.5–15.5)
WBC: 10.2 10*3/uL (ref 4.0–10.5)
nRBC: 0 % (ref 0.0–0.2)

## 2022-02-24 LAB — URINALYSIS, ROUTINE W REFLEX MICROSCOPIC
Bilirubin Urine: NEGATIVE
Glucose, UA: NEGATIVE mg/dL
Hgb urine dipstick: NEGATIVE
Ketones, ur: 15 mg/dL — AB
Leukocytes,Ua: NEGATIVE
Nitrite: NEGATIVE
Specific Gravity, Urine: 1.026 (ref 1.005–1.030)
pH: 7 (ref 5.0–8.0)

## 2022-02-24 LAB — LIPASE, BLOOD: Lipase: 23 U/L (ref 11–51)

## 2022-02-24 MED ORDER — IOHEXOL 300 MG/ML  SOLN
100.0000 mL | Freq: Once | INTRAMUSCULAR | Status: AC | PRN
  Administered 2022-02-24: 100 mL via INTRAVENOUS

## 2022-02-24 MED ORDER — ONDANSETRON HCL 4 MG PO TABS: 4.0000 mg | ORAL_TABLET | Freq: Three times a day (TID) | ORAL | 0 refills | Status: DC | PRN

## 2022-02-24 MED ORDER — ONDANSETRON HCL 4 MG/2ML IJ SOLN
4.0000 mg | Freq: Once | INTRAMUSCULAR | Status: AC
  Administered 2022-02-24: 4 mg via INTRAVENOUS
  Filled 2022-02-24: qty 2

## 2022-02-24 MED ORDER — SODIUM CHLORIDE 0.9 % IV BOLUS
1000.0000 mL | Freq: Once | INTRAVENOUS | Status: AC
  Administered 2022-02-24: 1000 mL via INTRAVENOUS

## 2022-02-24 NOTE — ED Triage Notes (Addendum)
Nausea and vomiting and left hip ache since yesterday.  Pt denies any urinary symptoms but states that she has not been able to keep anything down and feels dehydrated. Pt also reports abdominal bloating and her stool was mucous x2  yesterday

## 2022-02-24 NOTE — Discharge Instructions (Addendum)
Thank you for allowing me to be part of your care today.  Your laboratory work up was overall reassuring and mainly showed that you were dehydrated.  You likely have contracted a virus that is causing your symptoms, but should improve over time.  I have sent over a prescription of Zofran to your pharmacy to use as needed for nausea and vomiting.   Return to the ER if you experience worsening of your symptoms or have any new concerns.

## 2022-02-24 NOTE — ED Notes (Signed)
Pt stated she is unable to give urine specimen at this time

## 2022-02-24 NOTE — ED Provider Notes (Signed)
[facility] Provider Note   CSN: [id number] Arrival date & time: 02/24/22  1421     History  Chief Complaint  Patient presents with   Nausea    Donna Bullock is a 67 y.o. female presents to the ED complaining of neck pain, lower left back pain that extends into the buttock, nausea with associated vomiting, and cough for the past 2 days.  Denies known fever, but did have night sweats.  She reports last time she ate was yesterday, and was not able to eat more than 3 bites of a sandwich before vomiting.  She has not been able to keep fluids down including water.  She has only passed mucus and not a full bowel movement.  Patient is concerned she is dehydrated because she is not able to urinate.  Denies known sick contacts.  Denies dizziness, light-headedness, weakness, syncope, chest pain, shortness of breath, diarrhea, flank pain.      Home Medications Prior to Admission medications   Medication Sig Start Date End Date Taking? Authorizing Provider  ondansetron (ZOFRAN) 4 MG tablet Take 1 tablet (4 mg total) by mouth every 8 (eight) hours as needed for nausea or vomiting. 02/24/22  Yes Aadhya Bustamante R, PA  Artificial Tear Solution (SOOTHE XP) SOLN Place 1 drop into both eyes daily as needed (dry eyes).    [provider]  b complex vitamins tablet Take 1 tablet by mouth daily.    [provider]  bevacizumab (AVASTIN) 2.5 mg/0.1 mL SOLN intravitreal injection 0.625 mg by Intravitreal route once.    [provider]  Calcium Citrate-Vitamin D (CITRACAL + D PO) Take 1 tablet by mouth 2 (two) times daily.    [provider]  clopidogrel (PLAVIX) 75 MG tablet Take 1 tablet (75 mg total) by mouth daily. 12/09/21   Freada Bergeron, MD  diltiazem (DILTIAZEM CD) 120 MG 24 hr capsule Take 1 capsule (120 mg total) by mouth daily. 10/22/21   Freada Bergeron, MD  diphenhydrAMINE (BENADRYL) 25 MG tablet Take 25 mg by mouth daily as  needed for allergies.     [provider]  docusate sodium (COLACE) 100 MG capsule Take 1 capsule (100 mg total) by mouth 2 (two) times daily. 11/06/21   Irving Copas, PA-C  escitalopram (LEXAPRO) 10 MG tablet Take 1 tablet (10 mg total) by mouth daily. 10/23/21   Freada Bergeron, MD  fluticasone (FLONASE) 50 MCG/ACT nasal spray Place 2 sprays into both nostrils at bedtime.    [provider]  golimumab (SIMPONI ARIA) 50 MG/4ML SOLN injection Inject 50 mg into the vein every 3 (three) months.    [provider]  golimumab 2 mg/kg in sodium chloride 0.9 % Inject 2 mg/kg into the vein every 8 (eight) weeks.    [provider]  isosorbide mononitrate (IMDUR) 30 MG 24 hr tablet Take 0.5 tablets (15 mg total) by mouth at bedtime. 10/04/21   Freada Bergeron, MD  methocarbamol (ROBAXIN) 500 MG tablet Take 1 tablet (500 mg total) by mouth every 6 (six) hours as needed for muscle spasms (muscle pain). 11/06/21   Irving Copas, PA-C  metoprolol succinate (TOPROL-XL) 25 MG 24 hr tablet Take 1 tablet (25 mg total) by mouth daily. 09/09/21   Freada Bergeron, MD  montelukast (SINGULAIR) 10 MG tablet Take 10 mg by mouth at bedtime.    [provider]  Multiple Vitamins-Minerals (CENTRUM SILVER PO) Take 1 tablet by  mouth daily.     [provider]  Multiple Vitamins-Minerals (PRESERVISION AREDS 2) CAPS Take 1 capsule by mouth 2 (two) times daily.    [provider]  MYRBETRIQ 50 MG TB24 tablet Take 50 mg by mouth daily. 08/18/21   [provider]  omeprazole (PRILOSEC) 20 MG capsule Take 1 capsule (20 mg total) by mouth daily. 07/30/16   Burtis Junes, NP  polyethylene glycol (MIRALAX / GLYCOLAX) 17 g packet Take 17 g by mouth daily as needed for mild constipation. 11/06/21   Irving Copas, PA-C  rosuvastatin (CRESTOR) 10 MG tablet Take 1 tablet (10 mg total) by mouth daily. 10/23/21   Freada Bergeron, MD   sacubitril-valsartan (ENTRESTO) 24-26 MG Take 1 tablet by mouth 2 (two) times daily. 01/15/22   Freada Bergeron, MD  valACYclovir (VALTREX) 500 MG tablet TAKE 1 TABLET BY MOUTH TWICE A DAY FOR 3 DAYS AS NEEDED 05/12/19   Salvadore Dom, MD      Allergies    Orencia [abatacept], Other, Tomato, Valium, and Enbrel [etanercept]    Review of Systems   Review of Systems  Constitutional:  Positive for appetite change. Negative for fever.  Respiratory:  Positive for cough. Negative for shortness of breath.   Cardiovascular:  Negative for chest pain.  Gastrointestinal:  Positive for abdominal pain, nausea and vomiting. Negative for constipation and diarrhea.  Genitourinary:  Positive for decreased urine volume. Negative for dysuria and flank pain.  Neurological:  Negative for dizziness, syncope, weakness and light-headedness.    Physical Exam Updated Vital Signs BP (!) 165/148   Pulse 77   Temp 98.4 F (36.9 C) (Oral)   Resp (!) 24   LMP 02/18/1995   SpO2 98%  Physical Exam Vitals and nursing note reviewed.  Constitutional:      General: She is not in acute distress.    Appearance: Normal appearance. She is not ill-appearing.  HENT:     Mouth/Throat:     Mouth: Mucous membranes are moist.     Pharynx: Oropharynx is clear. Uvula midline.     Comments: Mucus membranes moist, however, tongue does appear somewhat dry with whitish discoloration.   Cardiovascular:     Rate and Rhythm: Normal rate and regular rhythm.     Pulses: Normal pulses.     Heart sounds: Normal heart sounds.  Pulmonary:     Effort: Pulmonary effort is normal. No respiratory distress.     Breath sounds: Normal breath sounds and air entry.     Comments: Lungs clear to auscultation bilaterally.  Cough appreciated during exam.   Abdominal:     General: Abdomen is flat. Bowel sounds are normal. There is no distension.     Palpations: Abdomen is soft.     Tenderness: There is abdominal tenderness in the  suprapubic area, left upper quadrant and left lower quadrant. There is no right CVA tenderness or left CVA tenderness.     Comments: Tenderness to deep palpation of left side of abdomen and suprapubic region.   Musculoskeletal:     Right lower leg: No edema.     Left lower leg: No edema.  Skin:    General: Skin is warm and dry.     Capillary Refill: Capillary refill takes less than 2 seconds.  Neurological:     Mental Status: She is alert. Mental status is at baseline.  Psychiatric:        Mood and Affect: Mood normal.  Behavior: Behavior normal.     ED Results / Procedures / Treatments   Labs (all labs ordered are listed, but only abnormal results are displayed) Labs Reviewed  COMPREHENSIVE METABOLIC PANEL - Abnormal; Notable for the following components:      Result Value   Glucose, Bld 147 (*)    Total Protein 8.4 (*)    Anion gap 17 (*)    All other components within normal limits  URINALYSIS, ROUTINE W REFLEX MICROSCOPIC - Abnormal; Notable for the following components:   Color, Urine COLORLESS (*)    Ketones, ur 15 (*)    Protein, ur TRACE (*)    All other components within normal limits  CBC WITH DIFFERENTIAL/PLATELET - Abnormal; Notable for the following components:   RBC 5.53 (*)    Hemoglobin 16.1 (*)    HCT 48.2 (*)    Neutro Abs 8.0 (*)    All other components within normal limits  RESP PANEL BY RT-PCR (RSV, FLU A&B, COVID)  RVPGX2  LIPASE, BLOOD    EKG None  Radiology CT ABDOMEN PELVIS W CONTRAST  Result Date: 02/24/2022 CLINICAL DATA:  Left lower quadrant and hip pain since yesterday. Nausea and vomiting. EXAM: CT ABDOMEN AND PELVIS WITH CONTRAST TECHNIQUE: Multidetector CT imaging of the abdomen and pelvis was performed using the standard protocol following bolus administration of intravenous contrast. RADIATION DOSE REDUCTION: This exam was performed according to the departmental dose-optimization program which includes automated exposure control,  adjustment of the mA and/or kV according to patient size and/or use of iterative reconstruction technique. CONTRAST:  156m OMNIPAQUE IOHEXOL 300 MG/ML  SOLN COMPARISON:  03/07/2020. FINDINGS: Lower chest: No acute abnormality. Hepatobiliary: No focal liver abnormality is seen. No gallstones, gallbladder wall thickening, or biliary dilatation. Pancreas: Unremarkable. No pancreatic ductal dilatation or surrounding inflammatory changes. Spleen: Normal in size without focal abnormality. Adrenals/Urinary Tract: 2 left adrenal nodules, 1.4 and 1.1 cm, stable compared to the prior CT. Both show significant relative washout between the initial and delayed post-contrast sequences, 50% or greater, consistent with adenomas. Normal right adrenal gland. Kidneys normal in size, orientation and position with symmetric enhancement and excretion. Multiple small low-attenuation renal lesions, most less than 3 mm in size, largest lower pole of the left kidney, 6 mm, all consistent with cysts. No follow-up recommended. No renal stones. No hydronephrosis. Normal ureters. Normal bladder. Bladder distal ureter assessment somewhat limited by artifact from a right hip prosthesis. Stomach/Bowel: Stomach is within normal limits. Appendix appears normal. No evidence of bowel wall thickening, distention, or inflammatory changes. Scattered colonic diverticula a mostly along the sigmoid, without diverticulitis. Vascular/Lymphatic: Aortic atherosclerosis. No aneurysm. No enlarged lymph nodes. Reproductive: Status post hysterectomy. No adnexal masses. Other: No abdominal wall hernia or abnormality. No abdominopelvic ascites. Musculoskeletal: No fracture or acute finding. No bone lesion. Well positioned and aligned right total hip arthroplasty. IMPRESSION: 1. No acute findings within the abdomen or pelvis. No findings to account for the patient's symptoms. 2. Scattered sigmoid colon diverticula without diverticulitis. 3. Aortic atherosclerosis.  Electronically Signed   By: DLajean ManesM.D.   On: 02/24/2022 17:26    Procedures Procedures    Medications Ordered in ED Medications  ondansetron (ZOFRAN) injection 4 mg (4 mg Intravenous Given 02/24/22 1607)  sodium chloride 0.9 % bolus 1,000 mL (0 mLs Intravenous Stopped 02/24/22 1819)  iohexol (OMNIPAQUE) 300 MG/ML solution 100 mL (100 mLs Intravenous Contrast Given 02/24/22 1716)    ED Course/ Medical Decision Making/ A&P  Medical Decision Making Amount and/or Complexity of Data Reviewed Labs: ordered. Radiology: ordered.  Risk Prescription drug management.   This patient presents to the ED with chief complaint(s) of nausea with vomiting, abdominal pain, back pain, and decreased urine output with pertinent past medical history of hypertension, GERD.  The complaint involves an extensive differential diagnosis and also carries with it a high risk of complications and morbidity.    The differential diagnosis includes gastroenteritis, gastroparesis, gastritis, colitis, nephrolithiasis, diverticulitis, muscle strain, influenza, COVID   The initial plan is to obtain baseline labs, UA and resp panel  Additional history obtained: Additional history obtained from family Records reviewed  none  Initial Assessment:   On exam, patient is not ill-appearing and is non-diaphoretic.  Heart rate is normal with a regular rhythm.  Lungs clear to auscultation bilaterally, cough appreciated during exam.  Abdomen is soft, non-distended, and tender in LUQ and LLQ.  Lower left back tender to palpation around SI joint and buttock.  No spinal tenderness to palpation.  No CVA tenderness.    Independent ECG/labs interpretation:  The following labs were independently interpreted:  CBC reveals no evidence of leukocytosis, although there is neutrophilia.  No evidence of anemia, hemoglobin is elevated at 16.1 with erythrocytosis, this may be relative erythrocytosis related to  dehydration.  Patient does not have history of erythrocytosis or current tobacco use.  Metabolic panel demonstrates mild hyperglycemia, no major electrolyte disturbances.  Elevated anion gap at 17.  Negative for COVID, influenza, and RSV Lipase WNL Patient was able to produce urine following fluid bolus.  UA had trace proteins and small amount of ketones.  No evidence of UTI.   Independent visualization and interpretation of imaging: I independently visualized the following imaging with scope of interpretation limited to determining acute life threatening conditions related to emergency care: CT abdomen/pelvis, which revealed no acute intra-abdominal pathology to explain patient's symptoms.  I agree with radiologist interpretation.    Treatment and Reassessment: Patient given fluid bolus to treat dehydration and Zofran for nausea.  Patient reports feeling improved following fluids.  Patient was able to tolerate a PO challenge with water and graham crackers.  She states she feels better and is ready to go home.   Disposition:   The patient has been appropriately medically screened and/or stabilized in the ED. I have low suspicion for any other emergent medical condition which would require further screening, evaluation or treatment in the ED or require inpatient management. At time of discharge the patient is hemodynamically stable and in no acute distress. I have discussed work-up results and diagnosis with patient and answered all questions. Patient is agreeable with discharge plan. We discussed strict return precautions for returning to the emergency department and they verbalized understanding.  Discussed supportive care measures for home including use of Tylenol or ibuprofen as needed for body aches and importance of good hydration.  Zofran prescription was sent to patient's pharmacy to use as needed.  Recommended follow-up with PCP if symptoms do not begin to improve with treatment and advised to  return to ED if she develops dehydration again and is unable to tolerate liquids despite Zofran.           Final Clinical Impression(s) / ED Diagnoses Final diagnoses:  Nausea and vomiting, unspecified vomiting type    Rx / DC Orders ED Discharge Orders          Ordered    ondansetron (ZOFRAN) 4 MG tablet  Every 8 hours PRN  02/24/22 1843              Theressa Stamps R, PA 02/24/22 1844    Fredia Sorrow, MD 02/28/22 2034

## 2022-02-24 NOTE — ED Notes (Signed)
Pt verbalized understanding of d/c instructions, meds, and followup care. Denies questions. VSS, no distress noted. Steady gait to exit with all belongings.  ?

## 2022-02-24 NOTE — ED Notes (Signed)
Pt aware of the need for a urine... Unable to currently... 

## 2022-02-27 ENCOUNTER — Other Ambulatory Visit: Payer: Self-pay

## 2022-02-27 ENCOUNTER — Encounter (HOSPITAL_BASED_OUTPATIENT_CLINIC_OR_DEPARTMENT_OTHER): Payer: Self-pay | Admitting: Emergency Medicine

## 2022-02-27 ENCOUNTER — Emergency Department (HOSPITAL_BASED_OUTPATIENT_CLINIC_OR_DEPARTMENT_OTHER)
Admission: EM | Admit: 2022-02-27 | Discharge: 2022-02-27 | Disposition: A | Discharge status: Home / Self Care | Payer: Medicare Other | Attending: Emergency Medicine | Admitting: Emergency Medicine

## 2022-02-27 ENCOUNTER — Ambulatory Visit: Payer: Self-pay | Admitting: *Deleted

## 2022-02-27 DIAGNOSIS — Z7902 Long term (current) use of antithrombotics/antiplatelets: Secondary | ICD-10-CM | POA: Diagnosis not present

## 2022-02-27 DIAGNOSIS — D72829 Elevated white blood cell count, unspecified: Secondary | ICD-10-CM | POA: Diagnosis not present

## 2022-02-27 DIAGNOSIS — E876 Hypokalemia: Secondary | ICD-10-CM | POA: Diagnosis not present

## 2022-02-27 DIAGNOSIS — I4581 Long QT syndrome: Secondary | ICD-10-CM | POA: Insufficient documentation

## 2022-02-27 DIAGNOSIS — E860 Dehydration: Secondary | ICD-10-CM | POA: Diagnosis not present

## 2022-02-27 DIAGNOSIS — Z79899 Other long term (current) drug therapy: Secondary | ICD-10-CM | POA: Insufficient documentation

## 2022-02-27 DIAGNOSIS — K529 Noninfective gastroenteritis and colitis, unspecified: Secondary | ICD-10-CM

## 2022-02-27 DIAGNOSIS — R9431 Abnormal electrocardiogram [ECG] [EKG]: Secondary | ICD-10-CM

## 2022-02-27 DIAGNOSIS — R112 Nausea with vomiting, unspecified: Secondary | ICD-10-CM | POA: Diagnosis present

## 2022-02-27 LAB — COMPREHENSIVE METABOLIC PANEL
ALT: 14 U/L (ref 0–44)
AST: 19 U/L (ref 15–41)
Albumin: 4.5 g/dL (ref 3.5–5.0)
Alkaline Phosphatase: 78 U/L (ref 38–126)
Anion gap: 14 (ref 5–15)
BUN: 18 mg/dL (ref 8–23)
CO2: 25 mmol/L (ref 22–32)
Calcium: 9.5 mg/dL (ref 8.9–10.3)
Chloride: 94 mmol/L — ABNORMAL LOW (ref 98–111)
Creatinine, Ser: 0.99 mg/dL (ref 0.44–1.00)
GFR, Estimated: >60 mL/min (ref >60)
Glucose, Bld: 128 mg/dL — ABNORMAL HIGH (ref 70–99)
Potassium: 3 mmol/L — ABNORMAL LOW (ref 3.5–5.1)
Sodium: 133 mmol/L — ABNORMAL LOW (ref 135–145)
Total Bilirubin: 0.9 mg/dL (ref 0.3–1.2)
Total Protein: 7.2 g/dL (ref 6.5–8.1)

## 2022-02-27 LAB — CBC WITH DIFFERENTIAL/PLATELET
Abs Immature Granulocytes: 0.02 10*3/uL (ref 0.00–0.07)
Basophils Absolute: 0.1 10*3/uL (ref 0.0–0.1)
Basophils Relative: 1 %
Eosinophils Absolute: 0.2 10*3/uL (ref 0.0–0.5)
Eosinophils Relative: 2 %
HCT: 46.1 % — ABNORMAL HIGH (ref 36.0–46.0)
Hemoglobin: 15.8 g/dL — ABNORMAL HIGH (ref 12.0–15.0)
Immature Granulocytes: 0 %
Lymphocytes Relative: 32 %
Lymphs Abs: 3.4 10*3/uL (ref 0.7–4.0)
MCH: 29.3 pg (ref 26.0–34.0)
MCHC: 34.3 g/dL (ref 30.0–36.0)
MCV: 85.5 fL (ref 80.0–100.0)
Monocytes Absolute: 1.2 10*3/uL — ABNORMAL HIGH (ref 0.1–1.0)
Monocytes Relative: 12 %
Neutro Abs: 5.7 10*3/uL (ref 1.7–7.7)
Neutrophils Relative %: 53 %
Platelets: 279 10*3/uL (ref 150–400)
RBC: 5.39 MIL/uL — ABNORMAL HIGH (ref 3.87–5.11)
RDW: 13.8 % (ref 11.5–15.5)
WBC: 10.6 10*3/uL — ABNORMAL HIGH (ref 4.0–10.5)
nRBC: 0 % (ref 0.0–0.2)

## 2022-02-27 LAB — LIPASE, BLOOD: Lipase: 36 U/L (ref 11–51)

## 2022-02-27 LAB — TROPONIN I (HIGH SENSITIVITY)
Troponin I (High Sensitivity): 50 ng/L — ABNORMAL HIGH (ref <18)
Troponin I (High Sensitivity): 67 ng/L — ABNORMAL HIGH (ref <18)

## 2022-02-27 MED ORDER — POTASSIUM CHLORIDE CRYS ER 20 MEQ PO TBCR
40.0000 meq | EXTENDED_RELEASE_TABLET | Freq: Once | ORAL | Status: AC
  Administered 2022-02-27: 40 meq via ORAL
  Filled 2022-02-27: qty 2

## 2022-02-27 MED ORDER — PROMETHAZINE HCL 12.5 MG PO TABS: 6.2500 mg | ORAL_TABLET | Freq: Three times a day (TID) | ORAL | 0 refills | Status: DC | PRN

## 2022-02-27 MED ORDER — SODIUM CHLORIDE 0.9 % IV BOLUS
1000.0000 mL | Freq: Once | INTRAVENOUS | Status: AC
  Administered 2022-02-27: 1000 mL via INTRAVENOUS

## 2022-02-27 MED ORDER — DIPHENHYDRAMINE HCL 50 MG/ML IJ SOLN
12.5000 mg | Freq: Once | INTRAMUSCULAR | Status: AC
  Administered 2022-02-27: 12.5 mg via INTRAVENOUS
  Filled 2022-02-27: qty 1

## 2022-02-27 MED ORDER — ONDANSETRON HCL 4 MG/2ML IJ SOLN: 4.0000 mg | Freq: Once | INTRAMUSCULAR | Status: DC

## 2022-02-27 MED ORDER — DIPHENHYDRAMINE HCL 25 MG PO TABS: 25.0000 mg | ORAL_TABLET | Freq: Four times a day (QID) | ORAL | 0 refills | Status: AC | PRN

## 2022-02-27 MED ORDER — SODIUM CHLORIDE 0.9 % IV SOLN
6.2500 mg | Freq: Once | INTRAVENOUS | Status: AC
  Administered 2022-02-27: 6.25 mg via INTRAVENOUS
  Filled 2022-02-27: qty 0.25

## 2022-02-27 MED ORDER — PROMETHAZINE HCL 25 MG/ML IJ SOLN
INTRAMUSCULAR | Status: AC
  Filled 2022-02-27: qty 1

## 2022-02-27 NOTE — Telephone Encounter (Signed)
  Chief Complaint: Continues to vomit and have dry heaves after being seen in the ED for same 02/24/2022. Symptoms: vomiting and nausea with dry heaves.   Eating and drinking very little.   Having mild dizziness and is very weak. Frequency: Over a week now Pertinent Negatives: Patient denies passing out or having abd pain or diarrhea. Disposition: '[x]'$ ED /'[]'$ Urgent Care (no appt availability in office) / '[]'$ Appointment(In office/virtual)/ '[]'$  [facility]/ '[]'$ Home Care/ '[]'$ Refused Recommended Disposition /'[]'$ [facility]/ '[]'$  Follow-up with PCP Additional Notes: I have referred her back to the ED since she is no better.   She is agreeable to returning.

## 2022-02-27 NOTE — Telephone Encounter (Signed)
Reason for Disposition  [1] MODERATE vomiting (e.g., 3 - 5 times/day) AND [2] age > 60 years  Answer Assessment - Initial Assessment Questions 1. VOMITING SEVERITY: "How many times have you vomited in the past 24 hours?"     - MILD:  1 - 2 times/day    - MODERATE: 3 - 5 times/day, decreased oral intake without significant weight loss or symptoms of dehydration    - SEVERE: 6 or more times/day, vomits everything or nearly everything, with significant weight loss, symptoms of dehydration      Seen in ED 02/24/2022 for vomiting.    I have heart issues so I'm worried about being dehydrated.    I have no energy.   I'm still having dry heaves.   I was tested for flu, Covid, and RSV was negative.    2. ONSET: "When did the vomiting begin?"      My PCP died with cancer.    3. FLUIDS: "What fluids or food have you vomited up today?" "Have you been able to keep any fluids down?"     I'm little sips of water.   Yesterday had 5 teaspoons of applesauce.   Nothing tastes good.    I'm a little dizzy and lightheaded.   I've vomited for several days now. 4. ABDOMEN PAIN: "Are your having any abdomen pain?" If Yes : "How bad is it and what does it feel like?" (e.g., crampy, dull, intermittent, constant)      No abd pain or fever.    Constant nausea and body aches and no energy. 5. DIARRHEA: "Is there any diarrhea?" If Yes, ask: "How many times today?"      No BM since Sunday.    It was a lot of mucus in my stool.   CT scan was normal in the ED.    6. CONTACTS: "Is there anyone else in the family with the same symptoms?"      No 7. CAUSE: "What do you think is causing your vomiting?"     I don't know.   The CT scan was normal 8. HYDRATION STATUS: "Any signs of dehydration?" (e.g., dry mouth [not only dry lips], too weak to stand) "When did you last urinate?"     I'm feeling a little dizzy and I'm very weak. 9. OTHER SYMPTOMS: "Do you have any other symptoms?" (e.g., fever, headache, vertigo, vomiting blood or  coffee grounds, recent head injury)     Weak and a little dizzy. 10. PREGNANCY: "Is there any chance you are pregnant?" "When was your last menstrual period?"       N/A due to age  Protocols used: Vomiting-A-AH

## 2022-02-27 NOTE — ED Triage Notes (Signed)
Pt arrives to ED with c/o continued nausea and vomiting that started on 1/7. Was seen for same on 1/8.

## 2022-02-27 NOTE — ED Notes (Signed)
Pt given discharge instructions and reviewed prescriptions. Opportunities given for questions. Pt verbalizes understanding. Davonna Ertl R, RN 

## 2022-02-27 NOTE — Discharge Instructions (Addendum)
Your EKG showed signs of prolonged QT. This is a common side effect of the Zofran (ondasteron) medication that you are taking for nausea and vomiting. I am prescribing you a different medication, the one you got in the ER today, called promethazine. This has a lower risk of prolonged QT. Take this every 8 hours as needed for nausea or vomiting with or without benadryl. If you continue to take Zofran and your QT continues to get longer on your EKG it can cause heart athymias and other things we don't want.

## 2022-03-04 ENCOUNTER — Other Ambulatory Visit: Payer: Self-pay | Admitting: Obstetrics and Gynecology

## 2022-03-04 DIAGNOSIS — Z1231 Encounter for screening mammogram for malignant neoplasm of breast: Secondary | ICD-10-CM

## 2022-03-09 NOTE — ED Provider Notes (Signed)
[place] Provider Note   CSN: [id number] Arrival date & time: 02/27/22  1056     History  No chief complaint on file.   Donna Bullock is a 67 y.o. female.  Patient is a 67 year old female presenting for nausea and vomiting.  Patient admits to evaluation 3 days ago on 02/24/2022 for nausea and vomiting that started on 02/23/2022.  She denies any fevers or chills.  Denies any myalgias, nasal congestion, sore throat, coughing, or diarrhea.  Denies any black or bloody stools.  Patient was discharged home with Zofran that she has been taking at home with minimal improvement of symptoms.  The history is provided by the patient. No language interpreter was used.       Home Medications Prior to Admission medications   Medication Sig Start Date End Date Taking? Authorizing Provider  diphenhydrAMINE (BENADRYL) 25 MG tablet Take 1 tablet (25 mg total) by mouth every 6 (six) hours as needed. 0/27/74  Yes Campbell Stall P, DO  promethazine (PHENERGAN) 12.5 MG tablet Take 0.5 tablets (6.25 mg total) by mouth every 8 (eight) hours as needed for nausea or vomiting. 03/16/76  Yes Campbell Stall P, DO  amoxicillin (AMOXIL) 500 MG capsule SMARTSIG:4 Capsule(s) By Mouth 02/18/22   [provider]  Artificial Tear Solution (SOOTHE XP) SOLN Place 1 drop into both eyes daily as needed (dry eyes).    [provider]  b complex vitamins tablet Take 1 tablet by mouth daily.    [provider]  bevacizumab (AVASTIN) 2.5 mg/0.1 mL SOLN intravitreal injection 0.625 mg by Intravitreal route once.    [provider]  Calcium Citrate-Vitamin D (CITRACAL + D PO) Take 1 tablet by mouth 2 (two) times daily.    [provider]  clopidogrel (PLAVIX) 75 MG tablet Take 1 tablet (75 mg total) by mouth daily. 12/09/21   Freada Bergeron, MD  diltiazem (DILTIAZEM CD) 120 MG 24 hr capsule Take 1 capsule (120 mg total) by mouth daily.  10/22/21   Freada Bergeron, MD  docusate sodium (COLACE) 100 MG capsule Take 1 capsule (100 mg total) by mouth 2 (two) times daily. 11/06/21   Irving Copas, PA-C  escitalopram (LEXAPRO) 10 MG tablet Take 1 tablet (10 mg total) by mouth daily. 10/23/21   Freada Bergeron, MD  fluticasone (FLONASE) 50 MCG/ACT nasal spray Place 2 sprays into both nostrils at bedtime.    [provider]  golimumab (SIMPONI ARIA) 50 MG/4ML SOLN injection Inject 50 mg into the vein every 3 (three) months.    [provider]  golimumab 2 mg/kg in sodium chloride 0.9 % Inject 2 mg/kg into the vein every 8 (eight) weeks.    [provider]  isosorbide mononitrate (IMDUR) 30 MG 24 hr tablet Take 0.5 tablets (15 mg total) by mouth at bedtime. 10/04/21   Freada Bergeron, MD  methocarbamol (ROBAXIN) 500 MG tablet Take 1 tablet (500 mg total) by mouth every 6 (six) hours as needed for muscle spasms (muscle pain). 11/06/21   Irving Copas, PA-C  metoprolol succinate (TOPROL-XL) 25 MG 24 hr tablet Take 1 tablet (25 mg total) by mouth daily. 09/09/21   Freada Bergeron, MD  montelukast (SINGULAIR) 10 MG tablet Take 10 mg by mouth at bedtime.    [provider]  Multiple Vitamins-Minerals (CENTRUM SILVER PO) Take 1 tablet by mouth daily.     [provider]  Multiple Vitamins-Minerals (PRESERVISION  AREDS 2) CAPS Take 1 capsule by mouth 2 (two) times daily.    [provider]  MYRBETRIQ 50 MG TB24 tablet Take 50 mg by mouth daily. 08/18/21   [provider]  omeprazole (PRILOSEC) 20 MG capsule Take 1 capsule (20 mg total) by mouth daily. 07/30/16   Burtis Junes, NP  ondansetron (ZOFRAN) 4 MG tablet Take 1 tablet (4 mg total) by mouth every 8 (eight) hours as needed for nausea or vomiting. 02/24/22   Clark, Meghan R, PA  polyethylene glycol (MIRALAX / GLYCOLAX) 17 g packet Take 17 g by mouth daily as needed for mild constipation. 11/06/21   Irving Copas,  PA-C  rosuvastatin (CRESTOR) 10 MG tablet Take 1 tablet (10 mg total) by mouth daily. 10/23/21   Freada Bergeron, MD  sacubitril-valsartan (ENTRESTO) 24-26 MG Take 1 tablet by mouth 2 (two) times daily. 01/15/22   Freada Bergeron, MD  valACYclovir (VALTREX) 500 MG tablet TAKE 1 TABLET BY MOUTH TWICE A DAY FOR 3 DAYS AS NEEDED 05/12/19   Salvadore Dom, MD      Allergies    Orencia [abatacept], Other, Tomato, Valium, and Enbrel [etanercept]    Review of Systems   Review of Systems  Constitutional:  Negative for chills and fever.  HENT:  Negative for ear pain and sore throat.   Eyes:  Negative for pain and visual disturbance.  Respiratory:  Negative for cough and shortness of breath.   Cardiovascular:  Negative for chest pain and palpitations.  Gastrointestinal:  Positive for nausea and vomiting. Negative for abdominal pain.  Genitourinary:  Negative for dysuria and hematuria.  Musculoskeletal:  Negative for arthralgias and back pain.  Skin:  Negative for color change and rash.  Neurological:  Negative for seizures and syncope.  All other systems reviewed and are negative.   Physical Exam Updated Vital Signs BP 125/65   Pulse (!) 57   Temp 97.9 F (36.6 C) (Oral)   Resp 20   Ht '5\' 7"'$  (1.702 m)   Wt 73.5 kg   LMP 02/18/1995   SpO2 96%   BMI 25.37 kg/m  Physical Exam Vitals and nursing note reviewed.  Constitutional:      General: She is not in acute distress.    Appearance: She is well-developed.  HENT:     Head: Normocephalic and atraumatic.  Eyes:     Conjunctiva/sclera: Conjunctivae normal.  Cardiovascular:     Rate and Rhythm: Normal rate and regular rhythm.     Heart sounds: No murmur heard. Pulmonary:     Effort: Pulmonary effort is normal. No respiratory distress.     Breath sounds: Normal breath sounds.  Abdominal:     Palpations: Abdomen is soft.     Tenderness: There is no abdominal tenderness.  Musculoskeletal:        General: No swelling.      Cervical back: Neck supple.  Skin:    General: Skin is warm and dry.     Capillary Refill: Capillary refill takes less than 2 seconds.  Neurological:     Mental Status: She is alert.  Psychiatric:        Mood and Affect: Mood normal.     ED Results / Procedures / Treatments   Labs (all labs ordered are listed, but only abnormal results are displayed) Labs Reviewed  CBC WITH DIFFERENTIAL/PLATELET - Abnormal; Notable for the following components:      Result Value   WBC 10.6 (*)  RBC 5.39 (*)    Hemoglobin 15.8 (*)    HCT 46.1 (*)    Monocytes Absolute 1.2 (*)    All other components within normal limits  COMPREHENSIVE METABOLIC PANEL - Abnormal; Notable for the following components:   Sodium 133 (*)    Potassium 3.0 (*)    Chloride 94 (*)    Glucose, Bld 128 (*)    All other components within normal limits  TROPONIN I (HIGH SENSITIVITY) - Abnormal; Notable for the following components:   Troponin I (High Sensitivity) 50 (*)    All other components within normal limits  TROPONIN I (HIGH SENSITIVITY) - Abnormal; Notable for the following components:   Troponin I (High Sensitivity) 67 (*)    All other components within normal limits  LIPASE, BLOOD    EKG EKG Interpretation  Date/Time:  Thursday February 27 2022 11:31:24 EST Ventricular Rate:  62 PR Interval:  154 QRS Duration: 107 QT Interval:  501 QTC Calculation: 509 R Axis:   47 Text Interpretation: Sinus rhythm Ventricular premature complex Probable LVH with secondary repol abnrm Prolonged QT interval Confirmed by Campbell Stall ([id number]) on 10/05/2991 11:49:27 AM  Radiology No results found.  Procedures Procedures    Medications Ordered in ED Medications  sodium chloride 0.9 % bolus 1,000 mL (1,000 mLs Intravenous New Bag/Given 02/27/22 1206)  potassium chloride SA (KLOR-CON M) CR tablet 40 mEq (40 mEq Oral Given 02/27/22 1303)  promethazine (PHENERGAN) 6.25 mg in sodium chloride 0.9 % 50 mL IVPB (6.25 mg  Intravenous New Bag/Given 02/27/22 1309)  diphenhydrAMINE (BENADRYL) injection 12.5 mg (12.5 mg Intravenous Given 02/27/22 1308)  promethazine (PHENERGAN) 25 MG/ML injection (  Given 02/27/22 1317)    ED Course/ Medical Decision Making/ A&P                             Medical Decision Making Amount and/or Complexity of Data Reviewed Labs: ordered.  Risk OTC drugs. Prescription drug management.   27:4 AM 67 year old female presenting for nausea and vomiting.  She is alert or x 3, no acute distress, afebrile, stable vital signs.  Physical exam demonstrates soft abdomen with no tenderness.  Patient is nontoxic-appearing.    Laboratory studies: Laboratory studies reviewed and interpreted by myself.   Laboratory studies are stable including leukocytosis at 10.6 without a left shift.  Stable liver profile, lipase, and renal function.  Hypokalemia present.  Potassium replaced.    Chart review demonstrates patient was recently seen in the emergency department with negative influenza and COVID PCR.  She also had stable laboratory findings at this time.  She also received a CT abdomen and pelvis with IV contrast demonstrating no acute process.    Management: Patient given IV fluids for hydration.  EKG concerning for prolonged QTc likely secondary to Zofran use at home.  I spoke with pharmacy regarding inability to give Tigan at this location.  All other antiemetics have some QTc prolonging effects.  Pharmacy recommending promethazine at this time.  Promethazine given with Benadryl.  On reevaluation patient is to significant improvement of symptoms.  Able to tolerate p.o. at this time.  Symptoms likely secondary gastroenteritis.  There has been a high influx of viral gastroenteritis patient to the emergency department over the last month.  Abdomen remains soft and nontender at this time.  Will hold off on repeat abdominal imaging at this time.  Prescription for promethazine provided with recommendation  for close follow-up with  PCP.  Patient in no distress and overall condition improved here in the ED. Detailed discussions were had with the patient regarding current findings, and need for close f/u with PCP or on call doctor. The patient has been instructed to return immediately if the symptoms worsen in any way for re-evaluation. Patient verbalized understanding and is in agreement with current care plan. All questions answered prior to discharge.         Final Clinical Impression(s) / ED Diagnoses Final diagnoses:  Gastroenteritis  Dehydration  Prolonged Q-T interval on ECG    Rx / DC Orders ED Discharge Orders          Ordered    promethazine (PHENERGAN) 12.5 MG tablet  Every 8 hours PRN        02/27/22 1519    diphenhydrAMINE (BENADRYL) 25 MG tablet  Every 6 hours PRN        02/27/22 1519              Lianne Cure, DO 33/43/56 0820

## 2022-03-24 ENCOUNTER — Other Ambulatory Visit: Payer: Self-pay

## 2022-03-24 MED ORDER — METOPROLOL SUCCINATE ER 25 MG PO TB24: 25.0000 mg | ORAL_TABLET | Freq: Every day | ORAL | 3 refills | Status: DC

## 2022-03-26 ENCOUNTER — Other Ambulatory Visit: Payer: Self-pay

## 2022-03-26 MED ORDER — ISOSORBIDE MONONITRATE ER 30 MG PO TB24: 15.0000 mg | ORAL_TABLET | Freq: Every day | ORAL | 3 refills | Status: DC

## 2022-03-27 ENCOUNTER — Telehealth: Payer: Self-pay | Admitting: *Deleted

## 2022-03-27 NOTE — Telephone Encounter (Signed)
Called the pt to inform her that our PA Nurse has been trying to contact her about pt assistance form for Entresto, but has been unsuccessful in getting in touch with her.   Pt apologizes for that and stated that Donna Bullock can call her anytime tomorrow morning about this.  Pt said she will be expecting Donna Bullock's call back tomorrow morning and will be on the listen out for her call.   Will forward this message to Donna Bullock to make her aware of this, so she can touch base with the pt tomorrow when she returns to the office.   Pt stated she had some questions anyway's to ask Donna Bullock, in regards to full completion of forms and her W2's.  Pt verbalized understanding and agrees with this plan.

## 2022-03-27 NOTE — Telephone Encounter (Signed)
**Note De-Identified Donna Bullock Obfuscation** I have called the pt X2 but got no answer and her voice mailbox has not been set up.  I did send her a message [name] [facility] asking her to contact us to let us know what she wants [place] to do with the providers page of her NPAF application once Dr Johney Frame signs it.

## 2022-03-27 NOTE — Telephone Encounter (Signed)
Pt mailed in a form to Dr. Johney Frame.  Form is application (Provider page only dropped off) for Entresto.  Pt only mailed Dr. Johney Frame the provider page for her to sign off on.  She did not bring in the whole application.   Provider page for [facility] placed in Dr. Jacolyn Reedy folder for her to review and sign when she returns to the office next Monday 03/31/22.  Will have Dr. Johney Frame sign at that time and notify the pt thereafter.  Will route this message to our Prior Auth Nurse to make her aware of this, for she will be overseeing the entire pt assistance application and rest of forms for this pts Entresto.

## 2022-03-28 NOTE — Telephone Encounter (Signed)
**Note De-Identified Donna Bullock Obfuscation** The pt is requesting that we complete the providers page of her NPAF application for Entresto that she mailed to [place] and for [place] to fax to NPAF once Dr Johney Frame signs and dates it. She states that she has already mailed her part of her application to [employer] with required documents.  She also wants Dr Johney Frame to be aware that she was only able to take [facility] for 1 month as she could not afford it and has resumed Lisinopril and she states that she has plenty on hand as she just got a refill before she was put on Entresto. I did advise her to continue to take Lisinopril until we get a determination from NPAF and hopefully she can re-start Entresto.  She is aware that Dr Johney Frame is in the office on Monday 2/12 and that we plan to have her sign and date the providers page of her application and that we plan to fax it to NPAF that day.  She does have concerns that her income will be above NPAFs limit as her husbands income was included in the 2022 proof of income that she sent to them and he has passed away so her income is a lot less now. I advised her to call NPAF to discuss with them as they may take this into consideration.  I did recommend that she call NPAF from time to time at [phone] to check the progress of her application as that will move it along quicker.  She verbalized understanding and thanked me for calling her to discuss.

## 2022-03-31 NOTE — Telephone Encounter (Signed)
Provider page was signed and dated today by Dr. Johney Frame.   Will go now and scan this form to [place] email, so that she can get all forms together and send off for completion.   Will route this message to [place] to make her aware that MD page signed and I will be sending this to her email here shortly.

## 2022-03-31 NOTE — Telephone Encounter (Signed)
**Note De-Identified Donna Bullock Obfuscation** I completed the already signed providers page of the pts NPAF application for Entresto assistance that was e-mailed to me and have faxed to NPAF Jaquisha Frech [employer]. Fax: Tx 'ok' Report CONE_EMAIL-to-Fax  Corbyn Wildey, Ritamarie This message was sent Kerry Chisolm [facility], a product from [employer] http://www.bis[employer].com/                    -------Fax Transmission Report-------  To:               Recipient at [phone] Subject:          Fw: Entresto Assistance Result:           The transmission was successful. Explanation:      All Pages Ok Pages Sent:       3 Connect Time:     2 minutes, 19 seconds Transmit Time:    03/31/2022 10:54 Transfer Rate:    14400 Status Code:      0000 Retry Count:      0 Job Id:           [id number] Unique IdCR:[id number] Fax Line:         27 Fax Server:       [id number]

## 2022-04-08 NOTE — Telephone Encounter (Addendum)
**Note De-Identified Mallory Schaad Obfuscation** Letter received from NPAF stating that they have denied the pt assistance for [facility]. Reason for denial: Household income exceeds program limits. Pt ID: [id number]  The letter states that they have notified the pt of this denial as well.

## 2022-04-15 ENCOUNTER — Telehealth: Payer: Self-pay | Admitting: *Deleted

## 2022-04-15 NOTE — Telephone Encounter (Signed)
   Pre-operative Risk Assessment    Patient Name: Donna Bullock  DOB: 01/11/1956 MRN: [id number]      Request for Surgical Clearance    Procedure:   COLONOSCOPY  Date of Surgery:  Clearance 05/07/22                                 Surgeon:  DR. MANN Surgeon's Group or Practice Name:  [facility] Phone number:  [phone] Fax number:  [phone]   Type of Clearance Requested:   - Medical  - Pharmacy:  Hold Clopidogrel (Plavix)     Type of Anesthesia:   PROPOFOL   Additional requests/questions:    Jiles Prows   04/15/2022, 5:06 PM

## 2022-04-16 ENCOUNTER — Telehealth: Payer: Self-pay

## 2022-04-16 NOTE — Telephone Encounter (Signed)
   Name: Donna Bullock  DOB: Feb 23, 1955  MRN: [id number]  Primary Cardiologist: Freada Bergeron, MD   Preoperative team, please contact this patient and set up a phone call appointment for further preoperative risk assessment. Please obtain consent and complete medication review. Thank you for your help.  I confirm that guidance regarding antiplatelet and oral anticoagulation therapy has been completed and, if necessary, noted below.  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 Per Dr. Gasper Sells 09/2021, she was cleared to hold plavix for 7 days.   If no interval changes in her history, it is reasonable to allow her to hold plavix for colonoscopy. She has needed biopsies in the past, so I suspect they will not want to substitute ASA during plavix hold, but will defer to surgeon.     Ledora Bottcher, PA 04/16/2022, 8:23 AM [place]

## 2022-04-16 NOTE — Telephone Encounter (Signed)
  Patient Consent for Virtual Visit         Donna Bullock has provided verbal consent on 04/16/2022 for a virtual visit (video or telephone).   CONSENT FOR VIRTUAL VISIT FOR:  Donna Bullock  By participating in this virtual visit I agree to the following:  I hereby voluntarily request, consent and authorize [facility] and its employed or contracted physicians, physician assistants, nurse practitioners or other licensed health care professionals (the Practitioner), to provide me with telemedicine health care services (the "Services") as deemed necessary by the treating Practitioner. I acknowledge and consent to receive the Services by the Practitioner via telemedicine. I understand that the telemedicine visit will involve communicating with the Practitioner through live audiovisual communication technology and the disclosure of certain medical information by electronic transmission. I acknowledge that I have been given the opportunity to request an in-person assessment or other available alternative prior to the telemedicine visit and am voluntarily participating in the telemedicine visit.  I understand that I have the right to withhold or withdraw my consent to the use of telemedicine in the course of my care at any time, without affecting my right to future care or treatment, and that the Practitioner or I may terminate the telemedicine visit at any time. I understand that I have the right to inspect all information obtained and/or recorded in the course of the telemedicine visit and may receive copies of available information for a reasonable fee.  I understand that some of the potential risks of receiving the Services via telemedicine include:  Delay or interruption in medical evaluation due to technological equipment failure or disruption; Information transmitted may not be sufficient (e.g. poor resolution of images) to allow for appropriate medical decision making by the  Practitioner; and/or  In rare instances, security protocols could fail, causing a breach of personal health information.  Furthermore, I acknowledge that it is my responsibility to provide information about my medical history, conditions and care that is complete and accurate to the best of my ability. I acknowledge that Practitioner's advice, recommendations, and/or decision may be based on factors not within their control, such as incomplete or inaccurate data provided by me or distortions of diagnostic images or specimens that may result from electronic transmissions. I understand that the practice of medicine is not an exact science and that Practitioner makes no warranties or guarantees regarding treatment outcomes. I acknowledge that a copy of this consent can be made available to me via my patient portal ([facility]), or I can request a printed copy by calling the office of [facility].    I understand that my insurance will be billed for this visit.   I have read or had this consent read to me. I understand the contents of this consent, which adequately explains the benefits and risks of the Services being provided via telemedicine.  I have been provided ample opportunity to ask questions regarding this consent and the Services and have had my questions answered to my satisfaction. I give my informed consent for the services to be provided through the use of telemedicine in my medical care

## 2022-04-16 NOTE — Telephone Encounter (Signed)
Patient scheduled for telehealth visit on 3/6. Med rec and consent done

## 2022-04-21 ENCOUNTER — Ambulatory Visit
Admission: RE | Admit: 2022-04-21 | Discharge: 2022-04-21 | Disposition: A | Discharge status: Home / Self Care | Payer: Medicare Other | Source: Ambulatory Visit | Attending: Obstetrics and Gynecology | Admitting: Obstetrics and Gynecology

## 2022-04-21 DIAGNOSIS — Z1231 Encounter for screening mammogram for malignant neoplasm of breast: Secondary | ICD-10-CM

## 2022-04-22 NOTE — Progress Notes (Unsigned)
Virtual Visit via Telephone Note   Because of Donna Bullock's co-morbid illnesses, she is at least at moderate risk for complications without adequate follow up.  This format is felt to be most appropriate for this patient at this time.  The patient did not have access to video technology/had technical difficulties with video requiring transitioning to audio format only (telephone).  All issues noted in this document were discussed and addressed.  No physical exam could be performed with this format.  Please refer to the patient's chart for her consent to telehealth for [facility].  Evaluation Performed:  Preoperative cardiovascular risk assessment _____________   Date:  04/22/2022   Patient ID:  Donna Bullock, DOB 1955/09/18, MRN [id number] Patient Location:  Home Provider location:   Office  Primary Care Provider:  Jani Gravel, MD Primary Cardiologist:  Freada Bergeron, MD  Chief Complaint / Patient Profile   67 y.o. y/o female with a h/o coronary artery disease, chronic systolic CHF, HLD, HTN, coronary vasospasm who is pending colonoscopy and presents today for telephonic preoperative cardiovascular risk assessment.  History of Present Illness    Donna Bullock is a 67 y.o. female who presents via audio/video conferencing for a telehealth visit today.  Pt was last seen in cardiology clinic on 01/15/2022 by Dr. Johney Frame.  At that time Donna Bullock was doing well .  The patient is now pending procedure as outlined above. Since her last visit, she remained stable from cardiac standpoint  Today she denies chest pain, shortness of breath, lower extremity edema, fatigue, palpitations, melena, hematuria, hemoptysis, diaphoresis, weakness, presyncope, syncope, orthopnea, and PND.   Past Medical History    Past Medical History:  Diagnosis Date   Arthritis    rheumatoid arthritis, and osteoarthritis    Benign tumor of adrenal gland    CAD (coronary artery  disease)    Inferior MI 1996 tx with bare metal stent RCA. Repeat  anterior MI 2002 with LAD vasospasm. Repeat inferior MI 2003 with occlusion RCA with overlapping Cypher DES in RCA. Repeat cath 2003 with profound vasospasm LAd and Circumflex. Repeat cath 2007  with patent RCA and no disease in LAD or Circumflex.   Depression    Environmental allergies    Family history of adverse reaction to anesthesia    mother had ? problem with anesthesia - was alcoholic - "mind was never right again"   Family history of breast cancer    Family history of leukemia    Family history of stomach cancer    Fibromyalgia    GERD (gastroesophageal reflux disease)    Heart murmur    History of kidney stones    History of stomach ulcers    due to taking aspirin for headaches   HTN (hypertension)    Hyperlipidemia    Myocardial infarction ([place])    times 4 "very minor" stents x2    Osteopenia    Osteoporosis    Rotator cuff tear    left   Sleep apnea    uses c pap - does not know settings    Urinary incontinence    Past Surgical History:  Procedure Laterality Date   ABDOMINAL HYSTERECTOMY     BACK SURGERY     CYSTOSCOPY WITH URETEROSCOPY AND STENT PLACEMENT Left 08/24/2019   Procedure: CYSTOSCOPY WITH DIAGNOSTIC URETEROSCOPY , REMOVAL OF STONES AND LEFT  STENT PLACEMENT;  Surgeon: Ardis Hughs, MD;  Location: [facility];  Service: Urology;  Laterality:  Left;   LAPAROSCOPIC LYSIS OF ADHESIONS     LEFT HEART CATH AND CORONARY ANGIOGRAPHY N/A 11/16/2017   Procedure: LEFT HEART CATH AND CORONARY ANGIOGRAPHY;  Surgeon: Belva Crome, MD;  Location: [facility];  Service: Cardiovascular;  Laterality: N/A;   MYOMECTOMY     age 21   PELVIC ABCESS DRAINAGE     SHOULDER OPEN ROTATOR CUFF REPAIR Left 11/16/2014   Procedure: LEFT MINI OPEN ROTATOR CUFF REPAIR SHOULDER OPEN WITH SUBACROMIAL DECOMPRESSION;  Surgeon: Susa Day, MD;  Location: [facility];  Service: Orthopedics;  Laterality: Left;   TOTAL HIP  ARTHROPLASTY Right 11/05/2021   Procedure: TOTAL HIP ARTHROPLASTY ANTERIOR APPROACH;  Surgeon: Paralee Cancel, MD;  Location: [facility];  Service: Orthopedics;  Laterality: Right;    Allergies  Allergies  Allergen Reactions   Orencia [Abatacept] Hives   Other Hives and Diarrhea    Peppers    Tomato Diarrhea   Valium Other (See Comments)    Altered mental state   Enbrel [Etanercept] Rash    Home Medications    Prior to Admission medications   Medication Sig Start Date End Date Taking? Authorizing Provider  Artificial Tear Solution (SOOTHE XP) SOLN Place 1 drop into both eyes daily as needed (dry eyes).    [provider]  b complex vitamins tablet Take 1 tablet by mouth daily.    [provider]  bevacizumab (AVASTIN) 2.5 mg/0.1 mL SOLN intravitreal injection 0.625 mg by Intravitreal route once.    [provider]  Calcium Citrate-Vitamin D (CITRACAL + D PO) Take 1 tablet by mouth 2 (two) times daily.    [provider]  clopidogrel (PLAVIX) 75 MG tablet Take 1 tablet (75 mg total) by mouth daily. 12/09/21   Freada Bergeron, MD  diltiazem (DILTIAZEM CD) 120 MG 24 hr capsule Take 1 capsule (120 mg total) by mouth daily. 10/22/21   Freada Bergeron, MD  diphenhydrAMINE (BENADRYL) 25 MG tablet Take 1 tablet (25 mg total) by mouth every 6 (six) hours as needed. 0000000   Campbell Stall P, DO  docusate sodium (COLACE) 100 MG capsule Take 1 capsule (100 mg total) by mouth 2 (two) times daily. 11/06/21   Irving Copas, PA-C  escitalopram (LEXAPRO) 10 MG tablet Take 1 tablet (10 mg total) by mouth daily. 10/23/21   Freada Bergeron, MD  fluticasone (FLONASE) 50 MCG/ACT nasal spray Place 2 sprays into both nostrils at bedtime.    [provider]  golimumab (SIMPONI ARIA) 50 MG/4ML SOLN injection Inject 50 mg into the vein every 3 (three) months.    [provider]  golimumab 2 mg/kg in sodium chloride 0.9 % Inject 2 mg/kg into the vein  every 8 (eight) weeks.    [provider]  isosorbide mononitrate (IMDUR) 30 MG 24 hr tablet Take 0.5 tablets (15 mg total) by mouth at bedtime. 03/26/22   Freada Bergeron, MD  lisinopril (ZESTRIL) 20 MG tablet Take 20 mg by mouth daily.    [provider]  metoprolol succinate (TOPROL-XL) 25 MG 24 hr tablet Take 1 tablet (25 mg total) by mouth daily. 03/24/22   Freada Bergeron, MD  montelukast (SINGULAIR) 10 MG tablet Take 10 mg by mouth at bedtime.    [provider]  Multiple Vitamins-Minerals (CENTRUM SILVER PO) Take 1 tablet by mouth daily.     [provider]  Multiple Vitamins-Minerals (PRESERVISION AREDS 2) CAPS Take 1 capsule by mouth 2 (two) times  daily.    [provider]  MYRBETRIQ 50 MG TB24 tablet Take 50 mg by mouth daily. 08/18/21   [provider]  omeprazole (PRILOSEC) 20 MG capsule Take 1 capsule (20 mg total) by mouth daily. 07/30/16   Burtis Junes, NP  polyethylene glycol (MIRALAX / GLYCOLAX) 17 g packet Take 17 g by mouth daily as needed for mild constipation. 11/06/21   Irving Copas, PA-C  rosuvastatin (CRESTOR) 10 MG tablet Take 1 tablet (10 mg total) by mouth daily. 10/23/21   Freada Bergeron, MD  sacubitril-valsartan (ENTRESTO) 24-26 MG Take 1 tablet by mouth 2 (two) times daily. Patient not taking: Reported on 04/16/2022 01/15/22   Freada Bergeron, MD  valACYclovir (VALTREX) 500 MG tablet TAKE 1 TABLET BY MOUTH TWICE A DAY FOR 3 DAYS AS NEEDED 05/12/19   Salvadore Dom, MD    Physical Exam    Vital Signs:  Donna Bullock does not have vital signs available for review today.  Given telephonic nature of communication, physical exam is limited. AAOx3. NAD. Normal affect.  Speech and respirations are unlabored.  Accessory Clinical Findings    None  Assessment & Plan    1.  Preoperative Cardiovascular Risk Assessment: Colonoscopy, Dr. Collene Mares, [facility]      Primary  Cardiologist: Freada Bergeron, MD  Chart reviewed as part of pre-operative protocol coverage. Given past medical history and time since last visit, based on ACC/AHA guidelines, Donna Bullock would be at acceptable risk for the planned procedure without further cardiovascular testing.   The patient was advised that if she develops new symptoms prior to surgery to contact our office to arrange for a follow-up visit, and she verbalized understanding.  Her Plavix may be held for 7 days prior to her procedure.  She will need to substitute 81 mg aspirin while she is not taking Plavix.  Please resume Plavix and discontinue ASA once hemostasis is achieved.  I will route this recommendation to the requesting party via Epic fax function and remove from pre-op pool.    Time:   Today, I have spent 7 minutes with the patient with telehealth technology discussing medical history, symptoms, and management plan.  Prior to her phone evaluation I spent greater than 10 minutes reviewing her past medical history and cardiac medications.   Deberah Pelton, NP  04/22/2022, 3:57 PM

## 2022-04-23 ENCOUNTER — Ambulatory Visit: Payer: Medicare Other | Attending: Internal Medicine

## 2022-04-23 DIAGNOSIS — Z01810 Encounter for preprocedural cardiovascular examination: Secondary | ICD-10-CM | POA: Diagnosis not present

## 2022-05-07 LAB — HM COLONOSCOPY

## 2022-06-09 LAB — LAB REPORT - SCANNED

## 2022-06-20 ENCOUNTER — Encounter: Payer: Self-pay | Admitting: Registered Nurse

## 2022-07-15 NOTE — Progress Notes (Deleted)
Cardiology Office Note:    Date:  07/15/2022   ID:  Donna Bullock, DOB [birth year], MRN [id number]  PCP:  Pearson Grippe, MD   [facility]  Cardiologist:  Meriam Sprague, MD  Advanced Practice Provider:  No care team member to display Electrophysiologist:  None    Referring MD: Pearson Grippe, MD    History of Present Illness:    Donna Bullock is a 67 y.o. female with a hx of CAD, HLD, HTN, RA, and tobacco abuse who was previously followed by [facility] now presenting to clinic for follow-up.  Her CAD dates back to 1996 when she had a stent placed in her RCA. 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 Cath showed a 75% distal LAD narrowing and this was managed conservatively with relook cath two days later showing 50% distal stenosis. She then had an inferior MI in 2003 with placement of overlapping Cypher drug eluting stents in the RCA. The LAD and Circumflex had mild disease per report. Four days later, she had severe chest pain, cath showed severe spasm of the LAD and Circumflex which resolved with IC vasodilators. Cath 2007 and showed patency of stents in the RCA and no other obstructive disease in the LAD and Circumflex. Remote stress test was in November 2009 and showed normal LVEF with no evidence of ischemia. She was seen in the ED 09/02/13 with bright red blood per rectum. Her ASA and Plavix were held. Colonoscopy 09/09/13 per Dr. Loreta Ave with polyps removed but no other bleeding source identified. Aspirin was stopped.  She was lost to follow-up until August of 2016 where she saw Jacki Cones. Underwent myoview with no ischemia but EF depressed. Had repeat cath which showed Distal LAD SCAD healed, no LM disease, LAD is large without significant disease. RCA 30% proximal to mid atherosclerotic disease. Mid to distal RCA stents patent. LV gram with inferobasal akinesis. Was recommened for continued medical management. She was last seen by Lawson Fiscal  02/01/20 she was doing well with no significant symptoms.   At her last visit with me on 02/22/2021, the patient stated that her husband passed away in 07/14/20 and things had been incredibly difficult since that time. She was not motivated to walk as much, but her DOE was improved when she was walking regularly. TTE 03/2021 showed EF 40-45% with inferior and inferolateral hypokinesis, mild to moderate AR. EF on cath in 2019 45-50%.  Was last seen in 12/2021 where she was doing well from a CV standpoint. Was recovering from a THA. Had stopped aldactone due to fatigue.  Today, ***  Past Medical History:  Diagnosis Date   Arthritis    rheumatoid arthritis, and osteoarthritis    Benign tumor of adrenal gland    CAD (coronary artery disease)    Inferior MI 1996 tx with bare metal stent RCA. Repeat  anterior MI 2002 with LAD vasospasm. Repeat inferior MI 2003 with occlusion RCA with overlapping Cypher DES in RCA. Repeat cath 2003 with profound vasospasm LAd and Circumflex. Repeat cath 2007  with patent RCA and no disease in LAD or Circumflex.   Depression    Environmental allergies    Family history of adverse reaction to anesthesia    mother had ? problem with anesthesia - was alcoholic - "mind was never right again"   Family history of breast cancer    Family history of leukemia    Family history of stomach cancer  Fibromyalgia    GERD (gastroesophageal reflux disease)    Heart murmur    History of kidney stones    History of stomach ulcers    due to taking aspirin for headaches   HTN (hypertension)    Hyperlipidemia    Myocardial infarction ([facility])    times 4 "very minor" stents x2    Osteopenia    Osteoporosis    Rotator cuff tear    left   Sleep apnea    uses c pap - does not know settings    Urinary incontinence     Past Surgical History:  Procedure Laterality Date   ABDOMINAL HYSTERECTOMY     BACK SURGERY     CYSTOSCOPY WITH URETEROSCOPY AND STENT PLACEMENT Left 08/24/2019    Procedure: CYSTOSCOPY WITH DIAGNOSTIC URETEROSCOPY , REMOVAL OF STONES AND LEFT  STENT PLACEMENT;  Surgeon: Crist Fat, MD;  Location: [facility];  Service: Urology;  Laterality: Left;   LAPAROSCOPIC LYSIS OF ADHESIONS     LEFT HEART CATH AND CORONARY ANGIOGRAPHY N/A 11/16/2017   Procedure: LEFT HEART CATH AND CORONARY ANGIOGRAPHY;  Surgeon: Lyn Records, MD;  Location: [facility];  Service: Cardiovascular;  Laterality: N/A;   MYOMECTOMY     age 76   PELVIC ABCESS DRAINAGE     SHOULDER OPEN ROTATOR CUFF REPAIR Left 11/16/2014   Procedure: LEFT MINI OPEN ROTATOR CUFF REPAIR SHOULDER OPEN WITH SUBACROMIAL DECOMPRESSION;  Surgeon: Jene Every, MD;  Location: [facility];  Service: Orthopedics;  Laterality: Left;   TOTAL HIP ARTHROPLASTY Right 11/05/2021   Procedure: TOTAL HIP ARTHROPLASTY ANTERIOR APPROACH;  Surgeon: Durene Romans, MD;  Location: [facility];  Service: Orthopedics;  Laterality: Right;    Current Medications: No outpatient medications have been marked as taking for the 07/18/22 encounter (Appointment) with Meriam Sprague, MD.     Allergies:   Orencia [abatacept], Other, Tomato, Valium, and Enbrel [etanercept]   Social History   Socioeconomic History   Marital status: Widowed    Spouse name: Not on file   Number of children: 0   Years of education: 72   Highest education level: Not on file  Occupational History    Employer: [employer]  Tobacco Use   Smoking status: Former    Packs/day: .5    Types: Cigarettes    Passive exposure: Past   Smokeless tobacco: Never   Tobacco comments:    currently using e-cigs  Vaping Use   Vaping Use: Some days   Substances: Nicotine, Flavoring  Substance and Sexual Activity   Alcohol use: No   Drug use: No   Sexual activity: Not Currently    Partners: Male    Birth control/protection: Surgical    Comment: TAH  Other Topics Concern   Not on file  Social History Narrative   Not on file   Social Determinants  of Health   Financial Resource Strain: Not on file  Food Insecurity: Not on file  Transportation Needs: Not on file  Physical Activity: Not on file  Stress: Not on file  Social Connections: Not on file     Family History: The patient'sfamily history includes Alcohol abuse in her father and mother; Breast cancer (age of onset: 63) in her maternal grandmother; Breast cancer (age of onset: 28) in her cousin; Breast cancer (age of onset: 73) in her maternal aunt; Breast cancer (age of onset: 30) in her mother; Breast cancer (age of onset: 54) in her maternal aunt; Cancer (age of  onset: 40) in her cousin; Cancer - Lung in her father; Leukemia (age of onset: 32) in her sister; Lung cancer in her father; Stomach cancer (age of onset: 90) in her paternal aunt.  ROS:   Please see the history of present illness.    Review of Systems  Constitutional:  Negative for chills and fever.  HENT:  Negative for nosebleeds and tinnitus.   Eyes:  Negative for blurred vision and pain.  Respiratory:  Negative for cough, hemoptysis, shortness of breath and stridor.   Cardiovascular:  Negative for chest pain, palpitations, orthopnea, claudication, leg swelling and PND.  Gastrointestinal:  Negative for blood in stool, diarrhea, nausea and vomiting.  Genitourinary:  Negative for dysuria and hematuria.  Musculoskeletal:  Positive for joint pain. Negative for falls.  Neurological:  Negative for dizziness, loss of consciousness and headaches.  Psychiatric/Behavioral:  Negative for depression, hallucinations and substance abuse. The patient does not have insomnia.      EKGs/Labs/Other Studies Reviewed:    The following studies were reviewed today:  Echo 03/22/2021: 1. Hypokinesis of the basal to mid inferior and inferolateral myocardium.  Left ventricular ejection fraction, by estimation, is 40 to 45%. The left  ventricle has mildly decreased function. The left ventricle demonstrates  regional wall motion   abnormalities (see scoring diagram/findings for description). There is  mild left ventricular hypertrophy of the basal-septal segment. Left  ventricular diastolic parameters are consistent with Grade I diastolic  dysfunction (impaired relaxation).   2. Right ventricular systolic function is normal. The right ventricular  size is normal. There is normal pulmonary artery systolic pressure.   3. Left atrial size was mildly dilated.   4. The mitral valve is normal in structure. Trivial mitral valve  regurgitation. No evidence of mitral stenosis.   5. The aortic valve is tricuspid. Aortic valve regurgitation is mild to  moderate. No aortic stenosis is present. Aortic regurgitation PHT measures  797 msec.   6. The inferior vena cava is normal in size with greater than 50%  respiratory variability, suggesting right atrial pressure of 3 mmHg.   Cath 11/16/17: Review of prior images demonstrates distal LAD SCAD in December 2002 that healed spontaneously.  10 months later, October 2003, SCAD in distal RCA treated with stenting. Left main is normal LAD is large and wraps around the apex.  LAD is normal and previous site of SCAD 15 years ago is normal. Circumflex coronary artery is normal. RCA is widely patent.  Segmental 30% proximal to mid atherosclerotic disease.  Mid to distal RCA stents are widely patent.  The stents are in the distribution of previous SCAD October 2003. Inferobasal akinesis.  EF 45 to 50%.  Normal LVEDP.   RECOMMENDATIONS:   Continue aggressive primary prevention/risk factor modification: Monitor glycemic control, blood pressure less than 130/80 mmHg, LDL less than 70, smoking cessation, and at least 150 minutes of moderate aerobic activity per week Current symptoms not felt to represent ischemia/obstructive CAD.  TTE 2018: Study Conclusions   - Left ventricle: The cavity size was mildly dilated. Systolic    function was normal. The estimated ejection fraction was in the     range of 55% to 60%. Wall motion was normal; there were no    regional wall motion abnormalities. Features are consistent with    a pseudonormal left ventricular filling pattern, with concomitant    abnormal relaxation and increased filling pressure (grade 2    diastolic dysfunction). Doppler parameters are consistent with  high ventricular filling pressure.  - Aortic valve: There was mild regurgitation.  - Mitral valve: There was trivial regurgitation.  Myoview 09/25/16: Normal exercise treadmill stress test, no ischemia. Normal exercise capacity. Normal BP response to stress.    EKG:  ***  Recent Labs: 02/27/2022: ALT 14; BUN 18; Creatinine, Ser 0.99; Hemoglobin 15.8; Platelets 279; Potassium 3.0; Sodium 133  Recent Lipid Panel    Component Value Date/Time   CHOL 154 02/05/2022 1006   TRIG 214 (H) 02/05/2022 1006   HDL 62 02/05/2022 1006   CHOLHDL 2.5 02/05/2022 1006   CHOLHDL 3.5 01/30/2016 1128   VLDL 71 (H) 01/30/2016 1128   LDLCALC 58 02/05/2022 1006     Risk Assessment/Calculations:       Physical Exam:    VS:  LMP 02/18/1995     Wt Readings from Last 3 Encounters:  02/27/22 162 lb (73.5 kg)  02/05/22 171 lb (77.6 kg)  01/15/22 171 lb (77.6 kg)     GEN: Well nourished, well developed in no acute distress HEENT: Normal NECK: No JVD; No carotid bruits CARDIAC: RRR, 2/6 systolic murmur best heard at the RUSB. No rubs, gallops RESPIRATORY:  Clear to auscultation without rales, wheezing or rhonchi  ABDOMEN: Soft, non-tender, non-distended MUSCULOSKELETAL:  No edema; No deformity  SKIN: Warm and dry NEUROLOGIC:  Alert and oriented x 3 PSYCHIATRIC:  Normal affect   ASSESSMENT:    No diagnosis found.    PLAN:   In order of problems listed above:  #CAD: #History of SCAD: #Coronary Vasospasm Remote history of MI with PCI to dRCA, prior SCAD and vasospasm. Cath in 2019 with no significant obstructive; healed LAD SCAD, patent RCA stents. Has been  managed on plavix for SCAD/RCA stents and imdur/dilt for vasospasm. Has intermittent chest pressure, but no significant symptoms and has not used nitro -Continue plavix 75mg  daily -Continue imdur 30mg  daily -Continue dilt 120mg  daily -Continue entresto 24/26mg  BID -Continue metop 25mg  XL daily -Continue crestor 10mg  daily -Off spiro due to fatigue   #Chronic Systolic HF: EF 40-45% with inferior and inferolateral hypokinesis. EF in 2019 on cath 45-50%. Suspect this is secondary to RCA infarct, however, has not had recent ischemic work-up. Will check myoview and optimize meds as below. Currently, she is compensated and euvolemic on exam. -Check myoview -Continue entresto 24/26mg  BID -Continue metop 25mg  XL daily -Did not tolerate spiro due to fatigue -Can trial farxiga/jardiance at next visit  -Check BMET once on entresto   #HTN: Well controlled at goal. 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  Concerns regarding medicines are outlined above.  No orders of the defined types were placed in this encounter.  No orders of the defined types were placed in this encounter.   There are no Patient Instructions on file for this visit.    Signed, Meriam Sprague, MD  07/15/2022 8:09 AM    [facility]

## 2022-07-18 ENCOUNTER — Ambulatory Visit: Payer: Medicare Other | Attending: Cardiology | Admitting: Cardiology

## 2022-07-18 ENCOUNTER — Encounter: Payer: Self-pay | Admitting: Cardiology

## 2022-07-27 NOTE — Progress Notes (Unsigned)
Cardiology Office Note:    Date:  07/27/2022   ID:  Donna Bullock, DOB [birth year], MRN [id number]  PCP:  Donna Grippe, MD   [facility]  Cardiologist:  Meriam Sprague, MD  Advanced Practice Provider:  No care team member to display Electrophysiologist:  None    Referring MD: Donna Grippe, MD    History of Present Illness:    Donna Bullock is a 67 y.o. female with a hx of CAD, HLD, HTN, RA, and tobacco abuse who was previously followed by [facility] now presenting to clinic for follow-up.  Her CAD dates back to 1996 when she had a stent placed in her RCA. 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 Cath showed a 75% distal LAD narrowing and this was managed conservatively with relook cath two days later showing 50% distal stenosis. She then had an inferior MI in 2003 with placement of overlapping Cypher drug eluting stents in the RCA. The LAD and Circumflex had mild disease per report. Four days later, she had severe chest pain, cath showed severe spasm of the LAD and Circumflex which resolved with IC vasodilators. Cath 2007 and showed patency of stents in the RCA and no other obstructive disease in the LAD and Circumflex. Remote stress test was in November 2009 and showed normal LVEF with no evidence of ischemia. She was seen in the ED 09/02/13 with bright red blood per rectum. Her ASA and Plavix were held. Colonoscopy 09/09/13 per Dr. Loreta Ave with polyps removed but no other bleeding source identified. Aspirin was stopped.  She was lost to follow-up until August of 2016 where she saw Jacki Cones. Underwent myoview with no ischemia but EF depressed. Had repeat cath which showed Distal LAD SCAD healed, no LM disease, LAD is large without significant disease. RCA 30% proximal to mid atherosclerotic disease. Mid to distal RCA stents patent. LV gram with inferobasal akinesis. Was recommened for continued medical management. She was last seen by Lawson Fiscal 02/01/20  she was doing well with no significant symptoms.   At her last visit with me on 02/22/2021, the patient stated that her husband passed away in 08-10-2020 and things had been incredibly difficult since that time. She was not motivated to walk as much, but her DOE was improved when she was walking regularly. TTE 03/2021 showed EF 40-45% with inferior and inferolateral hypokinesis, mild to moderate AR. EF on cath in 2019 45-50%.  Was last seen in 12/2021 where she was doing well from a CV standpoint. Was recovering from a THA. Had stopped aldactone due to fatigue.  Today, ***  Past Medical History:  Diagnosis Date   Arthritis    rheumatoid arthritis, and osteoarthritis    Benign tumor of adrenal gland    CAD (coronary artery disease)    Inferior MI 1996 tx with bare metal stent RCA. Repeat  anterior MI 2002 with LAD vasospasm. Repeat inferior MI 2003 with occlusion RCA with overlapping Cypher DES in RCA. Repeat cath 2003 with profound vasospasm LAd and Circumflex. Repeat cath 2007  with patent RCA and no disease in LAD or Circumflex.   Depression    Environmental allergies    Family history of adverse reaction to anesthesia    mother had ? problem with anesthesia - was alcoholic - "mind was never right again"   Family history of breast cancer    Family history of leukemia    Family history of stomach cancer  Fibromyalgia    GERD (gastroesophageal reflux disease)    Heart murmur    History of kidney stones    History of stomach ulcers    due to taking aspirin for headaches   HTN (hypertension)    Hyperlipidemia    Myocardial infarction (HCC)    times 4 "very minor" stents x2    Osteopenia    Osteoporosis    Rotator cuff tear    left   Sleep apnea    uses c pap - does not know settings    Urinary incontinence     Past Surgical History:  Procedure Laterality Date   ABDOMINAL HYSTERECTOMY     BACK SURGERY     CYSTOSCOPY WITH URETEROSCOPY AND STENT PLACEMENT Left 08/24/2019    Procedure: CYSTOSCOPY WITH DIAGNOSTIC URETEROSCOPY , REMOVAL OF STONES AND LEFT  STENT PLACEMENT;  Surgeon: Crist Fat, MD;  Location: [facility];  Service: Urology;  Laterality: Left;   LAPAROSCOPIC LYSIS OF ADHESIONS     LEFT HEART CATH AND CORONARY ANGIOGRAPHY N/A 11/16/2017   Procedure: LEFT HEART CATH AND CORONARY ANGIOGRAPHY;  Surgeon: Lyn Records, MD;  Location: [facility];  Service: Cardiovascular;  Laterality: N/A;   MYOMECTOMY     age 53   PELVIC ABCESS DRAINAGE     SHOULDER OPEN ROTATOR CUFF REPAIR Left 11/16/2014   Procedure: LEFT MINI OPEN ROTATOR CUFF REPAIR SHOULDER OPEN WITH SUBACROMIAL DECOMPRESSION;  Surgeon: Jene Every, MD;  Location: [facility];  Service: Orthopedics;  Laterality: Left;   TOTAL HIP ARTHROPLASTY Right 11/05/2021   Procedure: TOTAL HIP ARTHROPLASTY ANTERIOR APPROACH;  Surgeon: Durene Romans, MD;  Location: [facility];  Service: Orthopedics;  Laterality: Right;    Current Medications: No outpatient medications have been marked as taking for the 07/28/22 encounter (Appointment) with Meriam Sprague, MD.     Allergies:   Orencia [abatacept], Other, Tomato, Valium, and Enbrel [etanercept]   Social History   Socioeconomic History   Marital status: Widowed    Spouse name: Not on file   Number of children: 0   Years of education: 11   Highest education level: Not on file  Occupational History    Employer: [employer]  Tobacco Use   Smoking status: Former    Packs/day: .5    Types: Cigarettes    Passive exposure: Past   Smokeless tobacco: Never   Tobacco comments:    currently using e-cigs  Vaping Use   Vaping Use: Some days   Substances: Nicotine, Flavoring  Substance and Sexual Activity   Alcohol use: No   Drug use: No   Sexual activity: Not Currently    Partners: Male    Birth control/protection: Surgical    Comment: TAH  Other Topics Concern   Not on file  Social History Narrative   Not on file   Social Determinants of  Health   Financial Resource Strain: Not on file  Food Insecurity: Not on file  Transportation Needs: Not on file  Physical Activity: Not on file  Stress: Not on file  Social Connections: Not on file     Family History: The patient'sfamily history includes Alcohol abuse in her father and mother; Breast cancer (age of onset: 44) in her maternal grandmother; Breast cancer (age of onset: 52) in her cousin; Breast cancer (age of onset: 15) in her maternal aunt; Breast cancer (age of onset: 13) in her mother; Breast cancer (age of onset: 11) in her maternal aunt; Cancer (age of  onset: 54) in her cousin; Cancer - Lung in her father; Leukemia (age of onset: 35) in her sister; Lung cancer in her father; Stomach cancer (age of onset: 3) in her paternal aunt.  ROS:   Please see the history of present illness.    Review of Systems  Constitutional:  Negative for chills and fever.  HENT:  Negative for nosebleeds and tinnitus.   Eyes:  Negative for blurred vision and pain.  Respiratory:  Negative for cough, hemoptysis, shortness of breath and stridor.   Cardiovascular:  Negative for chest pain, palpitations, orthopnea, claudication, leg swelling and PND.  Gastrointestinal:  Negative for blood in stool, diarrhea, nausea and vomiting.  Genitourinary:  Negative for dysuria and hematuria.  Musculoskeletal:  Positive for joint pain. Negative for falls.  Neurological:  Negative for dizziness, loss of consciousness and headaches.  Psychiatric/Behavioral:  Negative for depression, hallucinations and substance abuse. The patient does not have insomnia.      EKGs/Labs/Other Studies Reviewed:    The following studies were reviewed today:  Echo 03/22/2021: 1. Hypokinesis of the basal to mid inferior and inferolateral myocardium.  Left ventricular ejection fraction, by estimation, is 40 to 45%. The left  ventricle has mildly decreased function. The left ventricle demonstrates  regional wall motion   abnormalities (see scoring diagram/findings for description). There is  mild left ventricular hypertrophy of the basal-septal segment. Left  ventricular diastolic parameters are consistent with Grade I diastolic  dysfunction (impaired relaxation).   2. Right ventricular systolic function is normal. The right ventricular  size is normal. There is normal pulmonary artery systolic pressure.   3. Left atrial size was mildly dilated.   4. The mitral valve is normal in structure. Trivial mitral valve  regurgitation. No evidence of mitral stenosis.   5. The aortic valve is tricuspid. Aortic valve regurgitation is mild to  moderate. No aortic stenosis is present. Aortic regurgitation PHT measures  797 msec.   6. The inferior vena cava is normal in size with greater than 50%  respiratory variability, suggesting right atrial pressure of 3 mmHg.   Cath 11/16/17: Review of prior images demonstrates distal LAD SCAD in December 2002 that healed spontaneously.  10 months later, October 2003, SCAD in distal RCA treated with stenting. Left main is normal LAD is large and wraps around the apex.  LAD is normal and previous site of SCAD 15 years ago is normal. Circumflex coronary artery is normal. RCA is widely patent.  Segmental 30% proximal to mid atherosclerotic disease.  Mid to distal RCA stents are widely patent.  The stents are in the distribution of previous SCAD October 2003. Inferobasal akinesis.  EF 45 to 50%.  Normal LVEDP.   RECOMMENDATIONS:   Continue aggressive primary prevention/risk factor modification: Monitor glycemic control, blood pressure less than 130/80 mmHg, LDL less than 70, smoking cessation, and at least 150 minutes of moderate aerobic activity per week Current symptoms not felt to represent ischemia/obstructive CAD.  TTE 2018: Study Conclusions   - Left ventricle: The cavity size was mildly dilated. Systolic    function was normal. The estimated ejection fraction was in the     range of 55% to 60%. Wall motion was normal; there were no    regional wall motion abnormalities. Features are consistent with    a pseudonormal left ventricular filling pattern, with concomitant    abnormal relaxation and increased filling pressure (grade 2    diastolic dysfunction). Doppler parameters are consistent with  high ventricular filling pressure.  - Aortic valve: There was mild regurgitation.  - Mitral valve: There was trivial regurgitation.  Myoview 09/25/16: Normal exercise treadmill stress test, no ischemia. Normal exercise capacity. Normal BP response to stress.    EKG:  ***  Recent Labs: 02/27/2022: ALT 14; BUN 18; Creatinine, Ser 0.99; Hemoglobin 15.8; Platelets 279; Potassium 3.0; Sodium 133  Recent Lipid Panel    Component Value Date/Time   CHOL 154 02/05/2022 1006   TRIG 214 (H) 02/05/2022 1006   HDL 62 02/05/2022 1006   CHOLHDL 2.5 02/05/2022 1006   CHOLHDL 3.5 01/30/2016 1128   VLDL 71 (H) 01/30/2016 1128   LDLCALC 58 02/05/2022 1006     Risk Assessment/Calculations:       Physical Exam:    VS:  LMP 02/18/1995     Wt Readings from Last 3 Encounters:  02/27/22 162 lb (73.5 kg)  02/05/22 171 lb (77.6 kg)  01/15/22 171 lb (77.6 kg)     GEN: Well nourished, well developed in no acute distress HEENT: Normal NECK: No JVD; No carotid bruits CARDIAC: RRR, 2/6 systolic murmur best heard at the RUSB. No rubs, gallops RESPIRATORY:  Clear to auscultation without rales, wheezing or rhonchi  ABDOMEN: Soft, non-tender, non-distended MUSCULOSKELETAL:  No edema; No deformity  SKIN: Warm and dry NEUROLOGIC:  Alert and oriented x 3 PSYCHIATRIC:  Normal affect   ASSESSMENT:    No diagnosis found.    PLAN:   In order of problems listed above:  #CAD: #History of SCAD: #Coronary Vasospasm Remote history of MI with PCI to dRCA, prior SCAD and vasospasm. Cath in 2019 with no significant obstructive; healed LAD SCAD, patent RCA stents. Has been  managed on plavix for SCAD/RCA stents and imdur/dilt for vasospasm. Has intermittent chest pressure, but no significant symptoms and has not used nitro -Continue plavix 75mg  daily -Continue imdur 30mg  daily -Continue dilt 120mg  daily -Continue entresto 24/26mg  BID -Continue metop 25mg  XL daily -Continue crestor 10mg  daily -Off spiro due to fatigue   #Chronic Systolic HF: EF 40-45% with inferior and inferolateral hypokinesis. EF in 2019 on cath 45-50%. Suspect this is secondary to RCA infarct, however, has not had recent ischemic work-up. Will check myoview and optimize meds as below. Currently, she is compensated and euvolemic on exam. -Check myoview -Continue entresto 24/26mg  BID -Continue metop 25mg  XL daily -Did not tolerate spiro due to fatigue -Can trial farxiga/jardiance at next visit  -Check BMET once on entresto   #HTN: Well controlled at goal. 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  Concerns regarding medicines are outlined above.  No orders of the defined types were placed in this encounter.  No orders of the defined types were placed in this encounter.   There are no Patient Instructions on file for this visit.    Signed, Meriam Sprague, MD  07/27/2022 8:24 PM    [facility]

## 2022-07-28 ENCOUNTER — Ambulatory Visit: Payer: Medicare Other | Attending: Cardiology

## 2022-07-28 ENCOUNTER — Encounter: Payer: Self-pay | Admitting: Cardiology

## 2022-07-28 ENCOUNTER — Telehealth: Payer: Self-pay | Admitting: *Deleted

## 2022-07-28 ENCOUNTER — Ambulatory Visit: Payer: Medicare Other | Attending: Cardiology | Admitting: Cardiology

## 2022-07-28 VITALS — BP 144/82 | HR 67 | Ht 67.0 in | Wt 169.0 lb

## 2022-07-28 DIAGNOSIS — E785 Hyperlipidemia, unspecified: Secondary | ICD-10-CM | POA: Diagnosis present

## 2022-07-28 DIAGNOSIS — I201 Angina pectoris with documented spasm: Secondary | ICD-10-CM | POA: Insufficient documentation

## 2022-07-28 DIAGNOSIS — R002 Palpitations: Secondary | ICD-10-CM

## 2022-07-28 DIAGNOSIS — R0609 Other forms of dyspnea: Secondary | ICD-10-CM | POA: Diagnosis present

## 2022-07-28 DIAGNOSIS — I5022 Chronic systolic (congestive) heart failure: Secondary | ICD-10-CM | POA: Insufficient documentation

## 2022-07-28 DIAGNOSIS — I2542 Coronary artery dissection: Secondary | ICD-10-CM | POA: Diagnosis present

## 2022-07-28 DIAGNOSIS — I10 Essential (primary) hypertension: Secondary | ICD-10-CM | POA: Diagnosis present

## 2022-07-28 NOTE — Progress Notes (Unsigned)
Enrolled for [employer] to mail a ZIO XT long term holter monitor to the patients address on file.  

## 2022-07-28 NOTE — Patient Instructions (Signed)
Medication Instructions:   Your physician recommends that you continue on your current medications as directed. Please refer to the Current Medication list given to you today.  *If you need a refill on your cardiac medications before your next appointment, please call your pharmacy*   Testing/Procedures:  Your physician has requested that you have an echocardiogram. Echocardiography is a painless test that uses sound waves to create images of your heart. It provides your doctor with information about the size and shape of your heart and how well your heart's chambers and valves are working. This procedure takes approximately one hour. There are no restrictions for this procedure. Please do NOT wear cologne, perfume, aftershave, or lotions (deodorant is allowed). Please arrive 15 minutes prior to your appointment time.     ZIO XT- Long Term Monitor Instructions  Your physician has requested you wear a ZIO patch monitor for 3 days.  This is a single patch monitor. [employer] supplies one patch monitor per enrollment. Additional stickers are not available. Please do not apply patch if you will be having a Nuclear Stress Test,  Echocardiogram, Cardiac CT, MRI, or Chest Xray during the period you would be wearing the  monitor. The patch cannot be worn during these tests. You cannot remove and re-apply the  ZIO XT patch monitor.  Your ZIO patch monitor will be mailed 3 day USPS to your address on file. It may take 3-5 days  to receive your monitor after you have been enrolled.  Once you have received your monitor, please review the enclosed instructions. Your monitor  has already been registered assigning a specific monitor serial # to you.  Billing and Patient Assistance Program Information  We have supplied [employer] with any of your insurance information on file for billing purposes. [employer] offers a sliding scale Patient Assistance Program for patients that do not have  insurance, or whose  insurance does not completely cover the cost of the ZIO monitor.  You must apply for the Patient Assistance Program to qualify for this discounted rate.  To apply, please call [employer] at [phone], select option 4, select option 2, ask to apply for  Patient Assistance Program. Theodore Demark will ask your household income, and how many people  are in your household. They will quote your out-of-pocket cost based on that information.  [employer] will also be able to set up a 49-month interest-free payment plan if needed.  Applying the monitor   Shave hair from upper left chest.  Hold abrader disc by orange tab. Rub abrader in 40 strokes over the upper left chest as  indicated in your monitor instructions.  Clean area with 4 enclosed alcohol pads. Let dry.  Apply patch as indicated in monitor instructions. Patch will be placed under collarbone on left  side of chest with arrow pointing upward.  Rub patch adhesive wings for 2 minutes. Remove white label marked "1". Remove the white  label marked "2". Rub patch adhesive wings for 2 additional minutes.  While looking in a mirror, press and release button in center of patch. A small green light will  flash 3-4 times. This will be your only indicator that the monitor has been turned on.  Do not shower for the first 24 hours. You may shower after the first 24 hours.  Press the button if you feel a symptom. You will hear a small click. Record Date, Time and  Symptom in the Patient Logbook.  When you are ready to remove the patch, follow  instructions on the last 2 pages of Patient  Logbook. Stick patch monitor onto the last page of Patient Logbook.  Place Patient Logbook in the blue and white box. Use locking tab on box and tape box closed  securely. The blue and white box has prepaid postage on it. Please place it in the mailbox as  soon as possible. Your physician should have your test results approximately 7 days after the  monitor has been mailed back  to [facility].  Call [facility] Customer Care at [phone] if you have questions regarding  your ZIO XT patch monitor. Call them immediately if you see an orange light blinking on your  monitor.  If your monitor falls off in less than 4 days, contact our Monitor department at [phone].  If your monitor becomes loose or falls off after 4 days call [employer] at [phone] for  suggestions on securing your monitor    Follow-Up: At [facility], you and your health needs are our priority.  As part of our continuing mission to provide you with exceptional heart care, we have created designated Provider Care Teams.  These Care Teams include your primary Cardiologist (physician) and Advanced Practice Providers (APPs -  Physician Assistants and Nurse Practitioners) who all work together to provide you with the care you need, when you need it.  We recommend signing up for the patient portal called "MyChart".  Sign up information is provided on this After Visit Summary.  MyChart is used to connect with patients for Virtual Visits (Telemedicine).  Patients are able to view lab/test results, encounter notes, upcoming appointments, etc.  Non-urgent messages can be sent to your provider as well.   To learn more about what you can do with MyChart, go to [email or web address].    Your next appointment:   6 month(s)  Provider:   Jodelle Red, MD

## 2022-07-28 NOTE — Telephone Encounter (Signed)
-----   Message from Ernst Bowler sent at 07/28/2022 11:17 AM EDT ----- Regarding: RE: 3 day zio per dr. Shari Prows done ----- Message ----- From: Loa Socks, LPN Sent: 7/82/9562  10:00 AM EDT To: Ernst Bowler; Katrina Claria Dice Subject: 3 day zio per dr. Shari Prows                    3 day zio ordered for palps  Please enroll and let me know when you do?  Thanks [employer]

## 2022-08-13 DIAGNOSIS — R002 Palpitations: Secondary | ICD-10-CM

## 2022-08-28 ENCOUNTER — Ambulatory Visit (HOSPITAL_COMMUNITY): Payer: Medicare Other

## 2022-09-18 ENCOUNTER — Ambulatory Visit (HOSPITAL_COMMUNITY): Payer: Medicare Other | Attending: Cardiology

## 2022-09-18 DIAGNOSIS — I201 Angina pectoris with documented spasm: Secondary | ICD-10-CM

## 2022-09-18 DIAGNOSIS — I5022 Chronic systolic (congestive) heart failure: Secondary | ICD-10-CM

## 2022-09-18 DIAGNOSIS — R0609 Other forms of dyspnea: Secondary | ICD-10-CM

## 2022-09-18 DIAGNOSIS — I2542 Coronary artery dissection: Secondary | ICD-10-CM

## 2022-09-18 DIAGNOSIS — R002 Palpitations: Secondary | ICD-10-CM

## 2022-09-18 LAB — ECHOCARDIOGRAM COMPLETE
AR max vel: 2.91 cm2
AV Area VTI: 3.03 cm2
AV Area mean vel: 2.93 cm2
AV Mean grad: 9 mmHg
AV Peak grad: 17 mmHg
Ao pk vel: 2.06 m/s
Area-P 1/2: 2.19 cm2
P 1/2 time: 630 msec
S' Lateral: 4.4 cm

## 2022-11-14 ENCOUNTER — Other Ambulatory Visit: Payer: Self-pay

## 2022-11-14 MED ORDER — ROSUVASTATIN CALCIUM 10 MG PO TABS: 10.0000 mg | ORAL_TABLET | Freq: Every day | ORAL | 2 refills | Status: DC

## 2022-12-03 ENCOUNTER — Other Ambulatory Visit: Payer: Self-pay

## 2022-12-03 MED ORDER — LISINOPRIL 20 MG PO TABS: 20.0000 mg | ORAL_TABLET | Freq: Every day | ORAL | 0 refills | Status: DC

## 2022-12-03 NOTE — Telephone Encounter (Signed)
Pt's pharmacy is requesting a refill escitalopram. Would the Cardiologist like to refill this non cardiac medication? Please address

## 2022-12-03 NOTE — Telephone Encounter (Signed)
Spoke with pt re Gen Lexapro .Pt may discuss with pharmacy or PCP as pt is willing to try and stop this medicine . Pt to call back with update ./cy

## 2022-12-12 ENCOUNTER — Other Ambulatory Visit: Payer: Self-pay

## 2022-12-12 MED ORDER — DILTIAZEM HCL ER COATED BEADS 120 MG PO CP24: 120.0000 mg | ORAL_CAPSULE | Freq: Every day | ORAL | 2 refills | Status: DC

## 2023-01-02 ENCOUNTER — Other Ambulatory Visit: Payer: Self-pay

## 2023-01-02 MED ORDER — CLOPIDOGREL BISULFATE 75 MG PO TABS: 75.0000 mg | ORAL_TABLET | Freq: Every day | ORAL | 2 refills | Status: DC

## 2023-02-06 ENCOUNTER — Ambulatory Visit (HOSPITAL_BASED_OUTPATIENT_CLINIC_OR_DEPARTMENT_OTHER): Payer: Medicare Other | Admitting: Cardiology

## 2023-02-06 VITALS — BP 134/78 | HR 78 | Ht 67.0 in | Wt 186.2 lb

## 2023-02-06 DIAGNOSIS — I25118 Atherosclerotic heart disease of native coronary artery with other forms of angina pectoris: Secondary | ICD-10-CM

## 2023-02-06 DIAGNOSIS — I5022 Chronic systolic (congestive) heart failure: Secondary | ICD-10-CM

## 2023-02-06 DIAGNOSIS — M797 Fibromyalgia: Secondary | ICD-10-CM | POA: Diagnosis not present

## 2023-02-06 DIAGNOSIS — I201 Angina pectoris with documented spasm: Secondary | ICD-10-CM

## 2023-02-06 DIAGNOSIS — F419 Anxiety disorder, unspecified: Secondary | ICD-10-CM

## 2023-02-06 DIAGNOSIS — I2510 Atherosclerotic heart disease of native coronary artery without angina pectoris: Secondary | ICD-10-CM

## 2023-02-06 MED ORDER — ESCITALOPRAM OXALATE 10 MG PO TABS: 10.0000 mg | ORAL_TABLET | Freq: Every day | ORAL | 1 refills | Status: DC

## 2023-02-06 NOTE — Patient Instructions (Addendum)
Look into [facility] groups. There is a lot of information online; we have a lot of patients that see either the primary care group on the third floor of [facility] or at the [facility] [place] office since they are nearby.  Follow up 6 months

## 2023-02-06 NOTE — Progress Notes (Unsigned)
Cardiology Office Note:  .   Date:  02/06/2023  ID:  Donna Bullock, DOB Dec 01, 1955, MRN [id number] PCP: Pearson Grippe, MD  [facility] Providers Cardiologist:  Meriam Sprague, MD (Inactive) {  History of Present Illness: .   Donna Bullock is a 67 y.o. female with PMH CAD, HLD, hypertension, RA, fibromyalgia, anxiety, prior tobacco abuse. She was previously followed by Dr. Deborah Chalk, Dr. Clifton James, Norma Fredrickson, Dr. Shari Prows and established care with me on 02/06/23.  Pertinent CV history: CAD with PCI in RCA iin 1996;She had an anterior MI with cardiac arrest (V Fib) in 2002. Cath showed a 75% distal LAD narrowing and this was managed conservatively with relook cath two days later showing 50% distal stenosis. She then had an inferior MI in 2003 with placement of overlapping Cypher drug eluting stents in the RCA. The LAD and Circumflex had mild disease per report. Four days later, she had severe chest pain, cath showed severe spasm of the LAD and Circumflex which resolved with IC vasodilators. Cath 2007 and showed patency of stents in the RCA and no other obstructive disease in the LAD and Circumflex. Remote stress test was in November 2009 and showed normal LVEF with no evidence of ischemia. She was seen in the ED 09/02/13 with bright red blood per rectum. Her ASA and Plavix were held. Colonoscopy 09/09/13 per Dr. Loreta Ave with polyps removed but no other bleeding source identified. Aspirin was stopped.   She was lost to follow-up until August of 2016 where she saw Norma Fredrickson. Underwent myoview with no ischemia but EF depressed. Had repeat cath which showed Distal LAD SCAD healed, no LM disease, LAD is large without significant disease. RCA 30% proximal to mid atherosclerotic disease. Mid to distal RCA stents patent. LV gram with inferobasal akinesis. Was recommened for continued medical management.  Today: She is frustrated by the health system; does not have a PCP, frustrated that her  initial cardiologist prescribed her lexapro and no one will refill it. We discussed the issue at length today.4  She was frustrated with an experience with an expensive medication (appears this was entresto). Does not want to go through the financial aid process again as her papers were lost. Reviewed medications, guideline directed medical therapy, and options.  She notes chronic whole body pain. Discussed statins today. She is not convinced it is her RA. Has been going on for years.   ROS: Denies chest pain, shortness of breath at rest or with normal exertion. No PND, orthopnea, LE edema or unexpected weight gain. No syncope. ROS otherwise negative except as noted.   Studies Reviewed: Marland Kitchen    EKG:       Physical Exam:   VS:  BP 134/78   Pulse 78   Ht 5\' 7"  (1.702 m)   Wt 186 lb 3.2 oz (84.5 kg)   LMP 02/18/1995   SpO2 97%   BMI 29.16 kg/m    Wt Readings from Last 3 Encounters:  02/06/23 186 lb 3.2 oz (84.5 kg)  07/28/22 169 lb (76.7 kg)  02/27/22 162 lb (73.5 kg)    GEN: Well nourished, well developed in no acute distress HEENT: Normal, moist mucous membranes NECK: No JVD CARDIAC: regular rhythm, normal S1 and S2, no rubs or gallops. No murmur. VASCULAR: Radial and DP pulses 2+ bilaterally. No carotid bruits RESPIRATORY:  Clear to auscultation without rales, wheezing or rhonchi  ABDOMEN: Soft, non-tender, non-distended MUSCULOSKELETAL:  Ambulates independently SKIN: Warm and dry, no edema  NEUROLOGIC:  Alert and oriented x 3. No focal neuro deficits noted. PSYCHIATRIC:  Normal affect    ASSESSMENT AND PLAN: .    CAD Coronary Vasospasm Remote history of MI with PCI to dRCA, prior vasospasm. Cath in 2019 with no significant obstructive; healed LAD SCAD, patent RCA stents. Has been managed on plavix for SCAD/RCA stents and imdur/dilt for vasospasm. Has intermittent chest pressure, but no significant symptoms and has not used nitro -Continue plavix 75mg  daily -Continue imdur  30mg  daily -Continue dilt 120mg  daily -Continue lisinopril 20mg  daily -Continue metop 25mg  XL daily -Continue crestor 10mg  daily -Start spironolactone 12.5mg  daily   #HTN: Elevated today. -Start spiro 12.5mg  daily -Continue imdur 30mg  daily -Continue lisinopril 20mg  daily -Continue metop 25mg  XL daily -Continue dilt 120mg  daily -Keep BP log at home   #DOE:  Chronic. Cath in 2019 without significant obstructive disease as above. TTE with normal LVEF, mild AR and TR. -Continue management of CAD as above -Continue blood pressure control -TTE at next visit in 6months for monitoring of MR/TR   #HLD: LDL controlled at 45 at PCP office -Continue crestor 10mg  daily due to body aches -LDL goal <55   #Mild AR: #Mild TR: -Repeat TTE for serial monitoring  CV risk counseling and prevention -recommend heart healthy/Mediterranean diet, with whole grains, fruits, vegetable, fish, lean meats, nuts, and olive oil. Limit salt. -recommend moderate walking, 3-5 times/week for 30-50 minutes each session. Aim for at least 150 minutes.week. Goal should be pace of 3 miles/hours, or walking 1.5 miles in 30 minutes -recommend avoidance of tobacco products. Avoid excess alcohol. -ASCVD risk score: The ASCVD Risk score (Arnett DK, et al., 2019) failed to calculate for the following reasons:   Risk score cannot be calculated because patient has a medical history suggesting prior/existing ASCVD    Dispo: ***  Signed, Jodelle Red, MD   Jodelle Red, MD, PhD, [facility]  [facility]  [place]  [facility] at [facility] [address], [address] [place], [place] [address] [phone]

## 2023-02-17 ENCOUNTER — Other Ambulatory Visit: Payer: Self-pay | Admitting: Physician Assistant

## 2023-03-01 ENCOUNTER — Encounter (HOSPITAL_BASED_OUTPATIENT_CLINIC_OR_DEPARTMENT_OTHER): Payer: Self-pay | Admitting: Cardiology

## 2023-03-25 ENCOUNTER — Other Ambulatory Visit: Payer: Self-pay | Admitting: Registered Nurse

## 2023-03-25 DIAGNOSIS — Z0000 Encounter for general adult medical examination without abnormal findings: Secondary | ICD-10-CM

## 2023-03-30 ENCOUNTER — Ambulatory Visit: Payer: Medicare Other | Admitting: Physician Assistant

## 2023-03-30 ENCOUNTER — Encounter: Payer: Self-pay | Admitting: Physician Assistant

## 2023-03-30 VITALS — BP 162/88 | HR 77 | Temp 97.3°F | Ht 67.0 in | Wt 188.4 lb

## 2023-03-30 DIAGNOSIS — M791 Myalgia, unspecified site: Secondary | ICD-10-CM | POA: Diagnosis not present

## 2023-03-30 DIAGNOSIS — Z131 Encounter for screening for diabetes mellitus: Secondary | ICD-10-CM | POA: Diagnosis not present

## 2023-03-30 DIAGNOSIS — R202 Paresthesia of skin: Secondary | ICD-10-CM | POA: Diagnosis not present

## 2023-03-30 DIAGNOSIS — H353130 Nonexudative age-related macular degeneration, bilateral, stage unspecified: Secondary | ICD-10-CM | POA: Insufficient documentation

## 2023-03-30 DIAGNOSIS — H579 Unspecified disorder of eye and adnexa: Secondary | ICD-10-CM

## 2023-03-30 DIAGNOSIS — M0579 Rheumatoid arthritis with rheumatoid factor of multiple sites without organ or systems involvement: Secondary | ICD-10-CM | POA: Diagnosis not present

## 2023-03-30 DIAGNOSIS — E7800 Pure hypercholesterolemia, unspecified: Secondary | ICD-10-CM | POA: Diagnosis not present

## 2023-03-30 DIAGNOSIS — Z0182 Encounter for allergy testing: Secondary | ICD-10-CM | POA: Diagnosis not present

## 2023-03-30 DIAGNOSIS — I10 Essential (primary) hypertension: Secondary | ICD-10-CM

## 2023-03-30 DIAGNOSIS — M2550 Pain in unspecified joint: Secondary | ICD-10-CM

## 2023-03-30 DIAGNOSIS — W57XXXS Bitten or stung by nonvenomous insect and other nonvenomous arthropods, sequela: Secondary | ICD-10-CM

## 2023-03-30 DIAGNOSIS — S80862S Insect bite (nonvenomous), left lower leg, sequela: Secondary | ICD-10-CM

## 2023-03-30 DIAGNOSIS — H3530 Unspecified macular degeneration: Secondary | ICD-10-CM | POA: Insufficient documentation

## 2023-03-30 DIAGNOSIS — M069 Rheumatoid arthritis, unspecified: Secondary | ICD-10-CM | POA: Insufficient documentation

## 2023-03-30 LAB — LIPID PANEL
Cholesterol: 154 mg/dL (ref 0–200)
HDL: 58 mg/dL (ref >39.00)
NonHDL: 96.25
Total CHOL/HDL Ratio: 3
Triglycerides: 434 mg/dL — ABNORMAL HIGH (ref 0.0–149.0)
VLDL: 86.8 mg/dL — ABNORMAL HIGH (ref 0.0–40.0)

## 2023-03-30 LAB — HEMOGLOBIN A1C: Hgb A1c MFr Bld: 6.3 % (ref 4.6–6.5)

## 2023-03-30 LAB — COMPREHENSIVE METABOLIC PANEL
ALT: 28 U/L (ref 0–35)
AST: 26 U/L (ref 0–37)
Albumin: 4.4 g/dL (ref 3.5–5.2)
Alkaline Phosphatase: 72 U/L (ref 39–117)
BUN: 14 mg/dL (ref 6–23)
CO2: 26 meq/L (ref 19–32)
Calcium: 9.2 mg/dL (ref 8.4–10.5)
Chloride: 106 meq/L (ref 96–112)
Creatinine, Ser: 0.74 mg/dL (ref 0.40–1.20)
GFR: 82.47 mL/min (ref >60.00)
Glucose, Bld: 86 mg/dL (ref 70–99)
Potassium: 3.8 meq/L (ref 3.5–5.1)
Sodium: 142 meq/L (ref 135–145)
Total Bilirubin: 0.4 mg/dL (ref 0.2–1.2)
Total Protein: 6.3 g/dL (ref 6.0–8.3)

## 2023-03-30 LAB — CBC WITH DIFFERENTIAL/PLATELET
Basophils Absolute: 0.1 10*3/uL (ref 0.0–0.1)
Basophils Relative: 1 % (ref 0.0–3.0)
Eosinophils Absolute: 0.4 10*3/uL (ref 0.0–0.7)
Eosinophils Relative: 4.7 % (ref 0.0–5.0)
HCT: 45.1 % (ref 36.0–46.0)
Hemoglobin: 15.2 g/dL — ABNORMAL HIGH (ref 12.0–15.0)
Lymphocytes Relative: 28.4 % (ref 12.0–46.0)
Lymphs Abs: 2.4 10*3/uL (ref 0.7–4.0)
MCHC: 33.8 g/dL (ref 30.0–36.0)
MCV: 92 fL (ref 78.0–100.0)
Monocytes Absolute: 0.7 10*3/uL (ref 0.1–1.0)
Monocytes Relative: 7.9 % (ref 3.0–12.0)
Neutro Abs: 4.9 10*3/uL (ref 1.4–7.7)
Neutrophils Relative %: 58 % (ref 43.0–77.0)
Platelets: 199 10*3/uL (ref 150.0–400.0)
RBC: 4.9 Mil/uL (ref 3.87–5.11)
RDW: 13.7 % (ref 11.5–15.5)
WBC: 8.4 10*3/uL (ref 4.0–10.5)

## 2023-03-30 LAB — VITAMIN B12: Vitamin B-12: 658 pg/mL (ref 211–911)

## 2023-03-30 LAB — TSH: TSH: 1.52 u[IU]/mL (ref 0.35–5.50)

## 2023-03-30 LAB — LDL CHOLESTEROL, DIRECT: Direct LDL: 65 mg/dL

## 2023-03-30 LAB — CK: Total CK: 117 U/L (ref 7–177)

## 2023-03-30 NOTE — Patient Instructions (Signed)
 VISIT SUMMARY:  During today's visit, we discussed your history of hypertension, coronary artery disease, rheumatoid arthritis, macular degeneration, skin sensitivity, muscle pain, tick exposure, and recent weight gain. We reviewed your current treatments and made plans for further evaluations and adjustments to your care.  YOUR PLAN:  -HYPERTENSION: Hypertension means high blood pressure. Since you missed your medication dose last night, your blood pressure is elevated today. Please resume your regular blood pressure medication tonight.  -RHEUMATOID ARTHRITIS: Rheumatoid arthritis is an autoimmune condition that causes pain and swelling in the joints. Your current medications are not fully controlling your symptoms, and you have multiple medication allergies. Continue your current treatment and discuss new medication options with your rheumatologist at your upcoming appointment.  -MACULAR DEGENERATION: Macular degeneration is an eye condition that affects your central vision. You are receiving regular injections that have improved your vision. Please continue with your current treatment.  -SKIN SENSITIVITY: You have reported skin sensitivity and occasional rashes without a clear cause. We will consult with your rheumatologist to see if this could be a side effect of your medications. We will check labs today as well.  -MUSCLE PAIN: You are experiencing widespread muscle pain that is not related to physical activity. We will check your creatine kinase levels to see if this could be due to a side effect of your statin medication.  -TICK EXPOSURE: You have frequent tick bites and are concerned about Lyme disease. We will order a Lyme titer to check for this.  -WEIGHT GAIN: You have gained weight over the past three years since your husband's death. We encourage you to continue physical activity and maintain a healthy diet.  -GENERAL HEALTH MAINTENANCE: We will check your complete blood count, liver  and kidney function, cholesterol levels, and B12 levels. You will also be referred to an allergist to evaluate potential new allergies. Additionally, we recommend a follow-up with a regular eye doctor for a routine eye exam and potential new glasses.  INSTRUCTIONS:  Please resume your regular blood pressure medication tonight. Continue your current treatment for rheumatoid arthritis and discuss new medication options with your rheumatologist at your upcoming appointment. We will check your creatine kinase levels to assess for potential statin-induced myopathy. A Lyme titer will be ordered to check for Lyme disease. We encourage you to continue physical activity and maintain a healthy diet. Follow up with an allergist for evaluation of potential new allergies and with a regular eye doctor for a routine eye exam and potential new glasses.

## 2023-03-30 NOTE — Progress Notes (Signed)
 Patient ID: Donna Bullock, female    DOB: Dec 10, 1955, 68 y.o.   MRN: [id number]   Assessment & Plan:  Arthralgia of multiple joints -     CK -     Lyme Disease Serology w/Reflex -     CBC with Differential/Platelet -     Comprehensive metabolic panel -     Lipid panel -     TSH -     Hemoglobin A1c -     Vitamin B12  Rheumatoid arthritis involving multiple sites with positive rheumatoid factor (HCC)  Tingling sensation -     CK -     Lyme Disease Serology w/Reflex -     CBC with Differential/Platelet -     Comprehensive metabolic panel -     Lipid panel -     TSH -     Hemoglobin A1c -     Vitamin B12  Encounter for allergy testing -     Ambulatory referral to Allergy  Itchy eyes -     Ambulatory referral to Allergy  Muscle pain -     CK -     Lyme Disease Serology w/Reflex -     CBC with Differential/Platelet -     Comprehensive metabolic panel -     Lipid panel -     TSH -     Hemoglobin A1c -     Vitamin B12  High cholesterol -     Lipid panel  Screening for diabetes mellitus -     Comprehensive metabolic panel -     Hemoglobin A1c  Tick bite of left lower leg, sequela -     Lyme Disease Serology w/Reflex  Primary hypertension  Macular degeneration of both eyes, unspecified type    Assessment and Plan    Hypertension Elevated blood pressure due to missed medication dose. -Resume regular blood pressure medication tonight.  Rheumatoid Arthritis Severe pain and swelling in hands and wrists, not well controlled with current medications. Multiple medication allergies reported. -Continue current treatment and discuss new medication options with rheumatologist at upcoming appointment.  Macular Degeneration Receiving regular injections, reports improvement in vision. -Continue current treatment.  Skin Sensitivity Reports skin sensitivity and occasional rashes, no clear cause identified. -Consult with rheumatologist regarding potential  medication side effects.  Muscle Pain Reports widespread muscle pain, not related to physical activity. -Check creatine kinase levels to assess for potential statin-induced myopathy.  Tick Exposure Frequent tick bites reported, concern for potential Lyme disease. -Order Lyme titer.  Weight Gain Significant weight gain reported since husband's death three years ago. -Encourage continued physical activity and healthy diet.  General Health Maintenance -Check complete blood count, liver and kidney function, cholesterol levels, and B12 levels. -Referral to allergist for evaluation of potential new allergies. -Recommend follow-up with regular eye doctor for routine eye exam and potential new glasses.          No follow-ups on file.    Subjective:    Chief Complaint  Patient presents with   New Patient (Initial Visit)    New pt in office to est care; pt hasn't seen PCP in 5 yrs; pt wants to discuss issue with the feeling of skin crawling; not sure if it's related to Rheum Arthritis or allergy related; mainly in arms and back;     HPI Discussed the use of AI scribe software for clinical note transcription with the patient, who gave verbal consent to proceed.  History of Present Illness  Donna Bullock has a history of hypertension and coronary artery disease, with four minor myocardial infarctions and stent placements since age 74. Donna Bullock missed her blood pressure medication last night and is uncertain about the timing of her doses. Her home blood pressure readings are elevated today. Donna Bullock is transitioning to a new cardiologist.  Donna Bullock has rheumatoid arthritis and has experienced allergic reactions to six different medications, resulting in urticaria with most. Relief is only achieved with steroids, which Donna Bullock is hesitant to use due to fear of addiction. Donna Bullock experiences significant pain, especially after physical activities like cutting trees and bushes, and has had two steroid packs in the past  two years. Donna Bullock is under the care of a specialist, Dr. Bascom Lily at [facility].  Donna Bullock has macular degeneration and receives intravitreal injections every 13 weeks, which have improved her vision. Donna Bullock has not seen a regular ophthalmologist in a couple of years and is considering a new one.  Donna Bullock experiences a skin sensitivity issue, describing it as a nerve-related problem where Donna Bullock cannot be touched at times. Donna Bullock purchases soft clothing to minimize irritation. There is no rash, but Donna Bullock feels the sensitivity on her back and arms.  Donna Bullock has a history of allergies, including to outdoor elements, cats, dogs, tomatoes, and peppers. Her eyes remain red-rimmed, and Donna Bullock is considering seeing an allergist to determine if there are new allergies.  Donna Bullock has gained weight over the past three years since her husband's death, increasing from 155 to 185 pounds, attributing this to neglecting self-care during the grieving period.  Donna Bullock reports frequent tick bites, approximately eight times a year, and wonders if her muscle pain and skin sensitivity could be related to tick-borne illnesses. Donna Bullock has not been tested for Lyme disease.  Donna Bullock is widowed, occasionally vapes, and drinks a glass of wine every other month. Donna Bullock has a fear of alcohol due to a family history of addiction. 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       Past Medical History:  Diagnosis Date   Arthritis    rheumatoid arthritis, and osteoarthritis    Benign tumor of adrenal gland    CAD (coronary artery disease)    Inferior MI 1996 tx with bare metal stent RCA. Repeat  anterior MI 2002 with LAD vasospasm. Repeat inferior MI 2003 with occlusion RCA with overlapping Cypher DES in RCA. Repeat cath 2003 with profound vasospasm LAd and Circumflex. Repeat cath 2007  with patent RCA and no disease in LAD or Circumflex.   Chronic pain    Depression    Environmental allergies    Family history  of adverse reaction to anesthesia    mother had ? problem with anesthesia - was alcoholic - "mind was never right again"   Family history of breast cancer    Family history of leukemia    Family history of stomach cancer    Fibromyalgia    GERD (gastroesophageal reflux disease)    Heart murmur    Heart murmur    History of kidney stones    History of stomach ulcers    due to taking aspirin  for headaches   HTN (hypertension)    Hyperlipidemia    Macular degeneration disease    Myocardial infarction (HCC)    times 4 "very minor" stents x2    Myocardial infarction (HCC)    Osteopenia    Osteoporosis    Rheumatic arteritis    Rotator cuff  tear    left   Sleep apnea    uses c pap - does not know settings    Urinary incontinence     Past Surgical History:  Procedure Laterality Date   ABDOMINAL HYSTERECTOMY     BACK SURGERY     CYSTOSCOPY WITH URETEROSCOPY AND STENT PLACEMENT Left 08/24/2019   Procedure: CYSTOSCOPY WITH DIAGNOSTIC URETEROSCOPY , REMOVAL OF STONES AND LEFT  STENT PLACEMENT;  Surgeon: Andrez Banker, MD;  Location: [facility];  Service: Urology;  Laterality: Left;   LAPAROSCOPIC LYSIS OF ADHESIONS     LEFT HEART CATH AND CORONARY ANGIOGRAPHY N/A 11/16/2017   Procedure: LEFT HEART CATH AND CORONARY ANGIOGRAPHY;  Surgeon: Arty Binning, MD;  Location: [facility];  Service: Cardiovascular;  Laterality: N/A;   MYOMECTOMY     age 8   PELVIC ABCESS DRAINAGE     SHOULDER OPEN ROTATOR CUFF REPAIR Left 11/16/2014   Procedure: LEFT MINI OPEN ROTATOR CUFF REPAIR SHOULDER OPEN WITH SUBACROMIAL DECOMPRESSION;  Surgeon: Orvan Blanch, MD;  Location: [facility];  Service: Orthopedics;  Laterality: Left;   TOTAL HIP ARTHROPLASTY Right 11/05/2021   Procedure: TOTAL HIP ARTHROPLASTY ANTERIOR APPROACH;  Surgeon: Claiborne Crew, MD;  Location: [facility];  Service: Orthopedics;  Laterality: Right;    Family History  Problem Relation Age of Onset   Alcohol abuse Father    Lung cancer  Father    Cancer - Lung Father    Alcohol abuse Mother    Breast cancer Mother 63   Breast cancer Maternal Aunt 17   Breast cancer Maternal Grandmother 34   Breast cancer Maternal Aunt 72   Breast cancer Cousin 58   Leukemia Sister 43   Stomach cancer Paternal Aunt 91   Cancer Cousin 67       type unk    Social History   Tobacco Use   Smoking status: Former    Types: Cigarettes    Passive exposure: Past   Smokeless tobacco: Never   Tobacco comments:    currently using e-cigs  Vaping Use   Vaping status: Every Day   Substances: Nicotine, Flavoring  Substance Use Topics   Alcohol use: No   Drug use: No     Allergies  Allergen Reactions   Cat Dander    Orencia [Abatacept] Hives   Other Hives and Diarrhea    Peppers    Tomato Diarrhea   Valium  Other (See Comments)    Altered mental state   Enbrel [Etanercept] Rash    Review of Systems NEGATIVE UNLESS OTHERWISE INDICATED IN HPI      Objective:     BP (!) 162/88 (BP Location: Left Arm, Patient Position: Sitting, Cuff Size: Normal)   Pulse 77   Temp (!) 97.3 F (36.3 C) (Temporal)   Ht 5\' 7"  (1.702 m)   Wt 188 lb 6.4 oz (85.5 kg)   LMP 02/18/1995   SpO2 96%   BMI 29.51 kg/m   Wt Readings from Last 3 Encounters:  03/30/23 188 lb 6.4 oz (85.5 kg)  02/06/23 186 lb 3.2 oz (84.5 kg)  07/28/22 169 lb (76.7 kg)    BP Readings from Last 3 Encounters:  03/30/23 (!) 162/88  02/06/23 134/78  07/28/22 (!) 144/82     Physical Exam Vitals and nursing note reviewed.  Constitutional:      Appearance: Normal appearance. Donna Bullock is normal weight. Donna Bullock is not toxic-appearing.  HENT:     Head: Normocephalic and atraumatic.  Right Ear: Tympanic membrane, ear canal and external ear normal.     Left Ear: Tympanic membrane, ear canal and external ear normal.     Nose: Nose normal.     Mouth/Throat:     Mouth: Mucous membranes are moist.  Eyes:     Extraocular Movements: Extraocular movements intact.      Conjunctiva/sclera: Conjunctivae normal.     Pupils: Pupils are equal, round, and reactive to light.  Cardiovascular:     Rate and Rhythm: Normal rate and regular rhythm.     Pulses: Normal pulses.     Heart sounds: Normal heart sounds.  Pulmonary:     Effort: Pulmonary effort is normal.     Breath sounds: Normal breath sounds.  Abdominal:     General: Abdomen is flat. Bowel sounds are normal.     Palpations: Abdomen is soft.  Musculoskeletal:        General: Swelling and tenderness present. Normal range of motion.     Cervical back: Normal range of motion and neck supple.     Comments: Bilateral hands swelling & TTP noted around mcp joints  Skin:    General: Skin is warm and dry.     Findings: No lesion or rash.  Neurological:     General: No focal deficit present.     Mental Status: Donna Bullock is alert and oriented to person, place, and time.  Psychiatric:        Mood and Affect: Mood normal.        Behavior: Behavior normal.        Thought Content: Thought content normal.        Judgment: Judgment normal.       Mikiah Demond M Irina Okelly, PA-C

## 2023-03-31 LAB — LYME DISEASE SEROLOGY W/REFLEX: Lyme Total Antibody EIA: NEGATIVE

## 2023-04-01 ENCOUNTER — Encounter: Payer: Self-pay | Admitting: Physician Assistant

## 2023-04-09 ENCOUNTER — Ambulatory Visit: Payer: Medicare Other | Admitting: Allergy

## 2023-04-13 NOTE — Progress Notes (Unsigned)
 New Patient Note  RE: Donna Bullock MRN: [id number] DOB: 07/23/1955 Date of Office Visit: 04/14/2023  Consult requested by: Allwardt, Crist Infante, PA-C Primary care provider: Pearson Grippe, MD  Chief Complaint: No chief complaint on file.  History of Present Illness: I had the pleasure of seeing Donna Bullock for initial evaluation at the [facility] on 04/13/2023. She is a 68 y.o. female, who is referred here by Pearson Grippe, MD for the evaluation of environmental allergies.  Discussed the use of AI scribe software for clinical note transcription with the patient, who gave verbal consent to proceed.  History of Present Illness             ***  Assessment and Plan: Donna Bullock is a 68 y.o. female with: ***  Assessment and Plan               No follow-ups on file.  No orders of the defined types were placed in this encounter.  Lab Orders  No laboratory test(s) ordered today    Other allergy screening: Asthma: {Blank single:19197::"yes","no"} Rhino conjunctivitis: {Blank single:19197::"yes","no"} Food allergy: {Blank single:19197::"yes","no"} Medication allergy: {Blank single:19197::"yes","no"} Hymenoptera allergy: {Blank single:19197::"yes","no"} Urticaria: {Blank single:19197::"yes","no"} Eczema:{Blank single:19197::"yes","no"} History of recurrent infections suggestive of immunodeficency: {Blank single:19197::"yes","no"}  Diagnostics: Spirometry:  Tracings reviewed. Her effort: {Blank single:19197::"Good reproducible efforts.","It was hard to get consistent efforts and there is a question as to whether this reflects a maximal maneuver.","Poor effort, data can not be interpreted."} FVC: ***L FEV1: ***L, ***% predicted FEV1/FVC ratio: ***% Interpretation: {Blank single:19197::"Spirometry consistent with mild obstructive disease","Spirometry consistent with moderate obstructive disease","Spirometry consistent with severe obstructive  disease","Spirometry consistent with possible restrictive disease","Spirometry consistent with mixed obstructive and restrictive disease","Spirometry uninterpretable due to technique","Spirometry consistent with normal pattern","No overt abnormalities noted given today's efforts"}.  Please see scanned spirometry results for details.  Skin Testing: {Blank single:19197::"Select foods","Environmental allergy panel","Environmental allergy panel and select foods","Food allergy panel","None","Deferred due to recent antihistamines use"}. *** Results discussed with patient/family.   Past Medical History: Patient Active Problem List   Diagnosis Date Noted  . Rheumatoid arthritis involving multiple sites with positive rheumatoid factor (HCC) 03/30/2023  . Macular degeneration of both eyes 03/30/2023  . S/P total right hip arthroplasty 11/05/2021  . Osteoarthritis of right hip 10/08/2021  . Urinary incontinence 10/07/2021  . Sleep apnea 10/07/2021  . Rotator cuff tear 10/07/2021  . Osteoporosis 10/07/2021  . Osteopenia 10/07/2021  . HTN (hypertension) 10/07/2021  . History of stomach ulcers 10/07/2021  . History of kidney stones 10/07/2021  . Heart murmur 10/07/2021  . GERD (gastroesophageal reflux disease) 10/07/2021  . Fibromyalgia 10/07/2021  . Family history of adverse reaction to anesthesia 10/07/2021  . Environmental allergies 10/07/2021  . Depression 10/07/2021  . Benign tumor of adrenal gland 10/07/2021  . Arthritis 10/07/2021  . Long term (current) use of anticoagulants 06/14/2019  . Genetic testing 06/19/2017  . Family history of breast cancer   . Family history of leukemia   . Family history of stomach cancer   . Rectal bleeding 09/02/2013  . CAD (coronary artery disease) 07/04/2010  . Shoulder pain   . Bruises easily   . Joint pain   . Ischemic heart disease   . Hyperlipidemia    Past Medical History:  Diagnosis Date  . Arthritis    rheumatoid arthritis, and  osteoarthritis   . Benign tumor of adrenal gland   . CAD (coronary artery disease)    Inferior MI 1996 tx with bare  metal stent RCA. Repeat  anterior MI 2002 with LAD vasospasm. Repeat inferior MI 2003 with occlusion RCA with overlapping Cypher DES in RCA. Repeat cath 2003 with profound vasospasm LAd and Circumflex. Repeat cath 2007  with patent RCA and no disease in LAD or Circumflex.  . Chronic pain   . Depression   . Environmental allergies   . Family history of adverse reaction to anesthesia    mother had ? problem with anesthesia - was alcoholic - "mind was never right again"  . Family history of breast cancer   . Family history of leukemia   . Family history of stomach cancer   . Fibromyalgia   . GERD (gastroesophageal reflux disease)   . Heart murmur   . Heart murmur   . History of kidney stones   . History of stomach ulcers    due to taking aspirin for headaches  . HTN (hypertension)   . Hyperlipidemia   . Macular degeneration disease   . Myocardial infarction (HCC)    times 4 "very minor" stents x2   . Myocardial infarction (HCC)   . Osteopenia   . Osteoporosis   . Rheumatic arteritis   . Rotator cuff tear    left  . Sleep apnea    uses c pap - does not know settings   . Urinary incontinence    Past Surgical History: Past Surgical History:  Procedure Laterality Date  . ABDOMINAL HYSTERECTOMY    . BACK SURGERY    . CYSTOSCOPY WITH URETEROSCOPY AND STENT PLACEMENT Left 08/24/2019   Procedure: CYSTOSCOPY WITH DIAGNOSTIC URETEROSCOPY , REMOVAL OF STONES AND LEFT  STENT PLACEMENT;  Surgeon: Crist Fat, MD;  Location: [facility];  Service: Urology;  Laterality: Left;  . LAPAROSCOPIC LYSIS OF ADHESIONS    . LEFT HEART CATH AND CORONARY ANGIOGRAPHY N/A 11/16/2017   Procedure: LEFT HEART CATH AND CORONARY ANGIOGRAPHY;  Surgeon: Lyn Records, MD;  Location: [facility];  Service: Cardiovascular;  Laterality: N/A;  . MYOMECTOMY     age 73  . PELVIC ABCESS  DRAINAGE    . SHOULDER OPEN ROTATOR CUFF REPAIR Left 11/16/2014   Procedure: LEFT MINI OPEN ROTATOR CUFF REPAIR SHOULDER OPEN WITH SUBACROMIAL DECOMPRESSION;  Surgeon: Jene Every, MD;  Location: [facility];  Service: Orthopedics;  Laterality: Left;  . TOTAL HIP ARTHROPLASTY Right 11/05/2021   Procedure: TOTAL HIP ARTHROPLASTY ANTERIOR APPROACH;  Surgeon: Durene Romans, MD;  Location: [facility];  Service: Orthopedics;  Laterality: Right;   Medication List:  Current Outpatient Medications  Medication Sig Dispense Refill  . Artificial Tear Solution (SOOTHE XP) SOLN Place 1 drop into both eyes daily as needed (dry eyes).    Marland Kitchen b complex vitamins tablet Take 1 tablet by mouth daily.    . bevacizumab (AVASTIN) 2.5 mg/0.1 mL SOLN intravitreal injection 0.625 mg by Intravitreal route once.    . Calcium Citrate-Vitamin D (CITRACAL + D PO) Take 1 tablet by mouth 2 (two) times daily.    . clopidogrel (PLAVIX) 75 MG tablet Take 1 tablet (75 mg total) by mouth daily. 90 tablet 2  . diltiazem (DILTIAZEM CD) 120 MG 24 hr capsule Take 1 capsule (120 mg total) by mouth daily. 90 capsule 2  . diphenhydrAMINE (BENADRYL) 25 MG tablet Take 1 tablet (25 mg total) by mouth every 6 (six) hours as needed. 30 tablet 0  . docusate sodium (COLACE) 100 MG capsule Take 1 capsule (100 mg total) by mouth 2 (two) times daily. (  Patient not taking: Reported on 02/06/2023) 10 capsule 0  . escitalopram (LEXAPRO) 10 MG tablet Take 1 tablet (10 mg total) by mouth daily. 90 tablet 1  . isosorbide mononitrate (IMDUR) 30 MG 24 hr tablet Take 0.5 tablets (15 mg total) by mouth at bedtime. 45 tablet 3  . lisinopril (ZESTRIL) 20 MG tablet TAKE 1 TABLET BY MOUTH DAILY 90 tablet 3  . metoprolol succinate (TOPROL-XL) 25 MG 24 hr tablet Take 1 tablet (25 mg total) by mouth daily. 90 tablet 3  . Multiple Vitamins-Minerals (CENTRUM SILVER PO) Take 1 tablet by mouth daily.     . Multiple Vitamins-Minerals (PRESERVISION AREDS 2) CAPS Take 1 capsule by  mouth 2 (two) times daily.    Marland Kitchen MYRBETRIQ 50 MG TB24 tablet Take 50 mg by mouth daily.    Marland Kitchen omeprazole (PRILOSEC) 20 MG capsule Take 1 capsule (20 mg total) by mouth daily. 90 capsule 3  . polyethylene glycol (MIRALAX / GLYCOLAX) 17 g packet Take 17 g by mouth daily as needed for mild constipation. 14 each 0  . rosuvastatin (CRESTOR) 10 MG tablet Take 1 tablet (10 mg total) by mouth daily. 90 tablet 2   No current facility-administered medications for this visit.   Allergies: Allergies  Allergen Reactions  . Cat Dander   . Orencia [Abatacept] Hives  . Other Hives and Diarrhea    Peppers   . Tomato Diarrhea  . Valium Other (See Comments)    Altered mental state  . Enbrel [Etanercept] Rash   Social History: Social History   Socioeconomic History  . Marital status: Widowed    Spouse name: Not on file  . Number of children: 0  . Years of education: 44  . Highest education level: Not on file  Occupational History    Employer: [employer]  Tobacco Use  . Smoking status: Former    Types: Cigarettes    Passive exposure: Past  . Smokeless tobacco: Never  . Tobacco comments:    currently using e-cigs  Vaping Use  . Vaping status: Every Day  . Substances: Nicotine, Flavoring  Substance and Sexual Activity  . Alcohol use: No  . Drug use: No  . Sexual activity: Not Currently    Partners: Male    Birth control/protection: Surgical    Comment: TAH  Other Topics Concern  . Not on file  Social History Narrative  . Not on file   Social Drivers of Health   Financial Resource Strain: Not on file  Food Insecurity: Not on file  Transportation Needs: Not on file  Physical Activity: Not on file  Stress: Not on file  Social Connections: Not on file   Lives in a ***. Smoking: *** Occupation: ***  Environmental HistorySurveyor, minerals in the house: Copywriter, advertising in the family room: {Blank single:19197::"yes","no"} Carpet in the bedroom:  {Blank single:19197::"yes","no"} Heating: {Blank single:19197::"electric","gas","heat pump"} Cooling: {Blank single:19197::"central","window","heat pump"} Pet: {Blank single:19197::"yes ***","no"}  Family History: Family History  Problem Relation Age of Onset  . Alcohol abuse Father   . Lung cancer Father   . Cancer - Lung Father   . Alcohol abuse Mother   . Breast cancer Mother 70  . Breast cancer Maternal Aunt 65  . Breast cancer Maternal Grandmother 34  . Breast cancer Maternal Aunt 72  . Breast cancer Cousin 45  . Leukemia Sister 13  . Stomach cancer Paternal Aunt 42  . Cancer Cousin 65       type unk  Problem                               Relation Asthma                                   *** Eczema                                *** Food allergy                          *** Allergic rhino conjunctivitis     ***  Review of Systems  Constitutional:  Negative for appetite change, chills, fever and unexpected weight change.  HENT:  Negative for congestion and rhinorrhea.   Eyes:  Negative for itching.  Respiratory:  Negative for cough, chest tightness, shortness of breath and wheezing.   Cardiovascular:  Negative for chest pain.  Gastrointestinal:  Negative for abdominal pain.  Genitourinary:  Negative for difficulty urinating.  Skin:  Negative for rash.  Neurological:  Negative for headaches.   Objective: LMP 02/18/1995  There is no height or weight on file to calculate BMI. Physical Exam Vitals and nursing note reviewed.  Constitutional:      Appearance: Normal appearance. She is well-developed.  HENT:     Head: Normocephalic and atraumatic.     Right Ear: Tympanic membrane and external ear normal.     Left Ear: Tympanic membrane and external ear normal.     Nose: Nose normal.     Mouth/Throat:     Mouth: Mucous membranes are moist.     Pharynx: Oropharynx is clear.  Eyes:     Conjunctiva/sclera: Conjunctivae normal.  Cardiovascular:     Rate and Rhythm:  Normal rate and regular rhythm.     Heart sounds: Normal heart sounds. No murmur heard.    No friction rub. No gallop.  Pulmonary:     Effort: Pulmonary effort is normal.     Breath sounds: Normal breath sounds. No wheezing, rhonchi or rales.  Musculoskeletal:     Cervical back: Neck supple.  Skin:    General: Skin is warm.     Findings: No rash.  Neurological:     Mental Status: She is alert and oriented to person, place, and time.  Psychiatric:        Behavior: Behavior normal.  The plan was reviewed with the patient/family, and all questions/concerned were addressed.  It was my pleasure to see Donna Bullock today and participate in her care. Please feel free to contact me with any questions or concerns.  Sincerely,  Wyline Mood, DO Allergy & Immunology  [facility] office: [phone] [facility] office: [phone]

## 2023-04-14 ENCOUNTER — Ambulatory Visit (INDEPENDENT_AMBULATORY_CARE_PROVIDER_SITE_OTHER): Payer: Medicare Other | Admitting: Allergy

## 2023-04-14 ENCOUNTER — Encounter: Payer: Self-pay | Admitting: Allergy

## 2023-04-14 VITALS — BP 144/72 | HR 60 | Temp 98.0°F | Resp 18 | Ht 66.14 in | Wt 182.5 lb

## 2023-04-14 DIAGNOSIS — J3089 Other allergic rhinitis: Secondary | ICD-10-CM

## 2023-04-14 DIAGNOSIS — T781XXD Other adverse food reactions, not elsewhere classified, subsequent encounter: Secondary | ICD-10-CM | POA: Diagnosis not present

## 2023-04-14 DIAGNOSIS — H1013 Acute atopic conjunctivitis, bilateral: Secondary | ICD-10-CM

## 2023-04-14 MED ORDER — FLUTICASONE PROPIONATE 50 MCG/ACT NA SUSP: 1.0000 | Freq: Every day | NASAL | 2 refills | Status: DC | PRN

## 2023-04-14 MED ORDER — AZELASTINE HCL 0.1 % NA SOLN: 1.0000 | Freq: Two times a day (BID) | NASAL | 2 refills | Status: DC | PRN

## 2023-04-14 MED ORDER — LEVOCETIRIZINE DIHYDROCHLORIDE 5 MG PO TABS: 5.0000 mg | ORAL_TABLET | Freq: Every evening | ORAL | 2 refills | Status: DC

## 2023-04-14 NOTE — Patient Instructions (Addendum)
 Rhinitis  Take xyzal (levocetirizine) daily as needed.  Get bloodwork for environmental allergies. If negative will refer to ENT next. Check with your eye doctor if you can take the following nasal sprays: Use Flonase (fluticasone) nasal spray 1-2 sprays per nostril once a day as needed for nasal congestion.  Use azelastine nasal spray 1-2 sprays per nostril twice a day as needed for runny nose/drainage. Nasal saline spray (i.e., Simply Saline) or nasal saline lavage (i.e., NeilMed) is recommended as needed and prior to medicated nasal sprays.  Foods Continue to limit tomatoes and peppers.  Get bloodwork We are ordering labs, so please allow 1-2 weeks for the results to come back. With the newly implemented Cures Act, the labs might be visible to you at the same time that they become visible to me. However, I will not address the results until all of the results are back, so please be patient.  In the meantime, continue recommendations in your patient instructions, including avoidance measures (if applicable), until you hear from me.  Follow up in 2 months or sooner if needed.

## 2023-04-17 ENCOUNTER — Other Ambulatory Visit: Payer: Self-pay

## 2023-04-17 MED ORDER — METOPROLOL SUCCINATE ER 25 MG PO TB24: 25.0000 mg | ORAL_TABLET | Freq: Every day | ORAL | 3 refills | Status: DC

## 2023-04-18 LAB — ALLERGENS W/TOTAL IGE AREA 2
Alternaria Alternata IgE: 0.14 kU/L — AB
Aspergillus Fumigatus IgE: <0.1 kU/L
Bermuda Grass IgE: 2.47 kU/L — AB
Cat Dander IgE: 1.22 kU/L — AB
Cedar, Mountain IgE: 1.64 kU/L — AB
Cladosporium Herbarum IgE: <0.1 kU/L
Cockroach, German IgE: 1.89 kU/L — AB
Common Silver Birch IgE: 1.72 kU/L — AB
Cottonwood IgE: 1.87 kU/L — AB
D Farinae IgE: 0.6 kU/L — AB
D Pteronyssinus IgE: 0.19 kU/L — AB
Dog Dander IgE: 0.25 kU/L — AB
Elm, American IgE: 2.1 kU/L — AB
IgE (Immunoglobulin E), Serum: 63 [IU]/mL (ref 6–495)
Johnson Grass IgE: 2.08 kU/L — AB
Maple/Box Elder IgE: 1.82 kU/L — AB
Mouse Urine IgE: 0.15 kU/L — AB
Oak, White IgE: 1.84 kU/L — AB
Pecan, Hickory IgE: 1.88 kU/L — AB
Penicillium Chrysogen IgE: <0.1 kU/L
Pigweed, Rough IgE: 1.79 kU/L — AB
Ragweed, Short IgE: 2.35 kU/L — AB
Sheep Sorrel IgE Qn: 2.18 kU/L — AB
Timothy Grass IgE: 2.14 kU/L — AB
White Mulberry IgE: 1.23 kU/L — AB

## 2023-04-18 LAB — ALLERGEN,CHILI PEPPER,RF279

## 2023-04-18 LAB — ALLERGEN, BLACK PEPPER,F280: Allergen Black Pepper IgE: 1.16 kU/L — AB

## 2023-04-18 LAB — ALLERGEN,GRN PEPPER,PAPRIKA,F218: Paprika IgE: 2.07 kU/L — AB

## 2023-04-18 LAB — ALLERGEN, TOMATO F25: Allergen Tomato, IgE: 2.05 kU/L — AB

## 2023-04-19 ENCOUNTER — Encounter: Payer: Self-pay | Admitting: Allergy

## 2023-04-22 ENCOUNTER — Ambulatory Visit
Admission: RE | Admit: 2023-04-22 | Discharge: 2023-04-22 | Disposition: A | Discharge status: Home / Self Care | Payer: Medicare Other | Source: Ambulatory Visit | Attending: Registered Nurse | Admitting: Registered Nurse

## 2023-04-22 DIAGNOSIS — Z0000 Encounter for general adult medical examination without abnormal findings: Secondary | ICD-10-CM

## 2023-04-30 ENCOUNTER — Other Ambulatory Visit: Payer: Self-pay

## 2023-04-30 DIAGNOSIS — F419 Anxiety disorder, unspecified: Secondary | ICD-10-CM

## 2023-04-30 MED ORDER — ESCITALOPRAM OXALATE 10 MG PO TABS: 10.0000 mg | ORAL_TABLET | Freq: Every day | ORAL | 2 refills | Status: DC

## 2023-04-30 NOTE — Telephone Encounter (Signed)
 Pt's pharmacy is requesting a refill on escitalopram. Would Dr. Cristal Deer like to refill this non cardiac medication? Please address

## 2023-05-01 ENCOUNTER — Other Ambulatory Visit: Payer: Self-pay

## 2023-05-01 MED ORDER — ISOSORBIDE MONONITRATE ER 30 MG PO TB24: 15.0000 mg | ORAL_TABLET | Freq: Every day | ORAL | 2 refills | Status: DC

## 2023-05-04 ENCOUNTER — Other Ambulatory Visit: Payer: Self-pay | Admitting: Physician Assistant

## 2023-05-04 DIAGNOSIS — F419 Anxiety disorder, unspecified: Secondary | ICD-10-CM

## 2023-05-04 MED ORDER — ESCITALOPRAM OXALATE 10 MG PO TABS: 10.0000 mg | ORAL_TABLET | Freq: Every day | ORAL | 0 refills | Status: DC

## 2023-05-04 NOTE — Telephone Encounter (Signed)
 Copied from CRM [id number]. Topic: Clinical - Medication Refill >> May 04, 2023  9:55 AM Elizebeth Brooking wrote: Most Recent Primary Care Visit:  Provider: Bary Leriche  Department: [facility]  Visit Type: NEW PT - OFFICE VISIT  Date: 03/30/2023  Medication: escitalopram (LEXAPRO) 10 MG tablet  Has the patient contacted their pharmacy? Yes (Agent: If no, request that the patient contact the pharmacy for the refill. If patient does not wish to contact the pharmacy document the reason why and proceed with request.) (Agent: If yes, when and what did the pharmacy advise?)  Is this the correct pharmacy for this prescription? Yes If no, delete pharmacy and type the correct one.  This is the patient's preferred pharmacy:  [id number] - [place], [place] - [address] [place] [address] Phone: [phone] Fax: [phone]     Has the prescription been filled recently? No  Is the patient out of the medication? Yes  Has the patient been seen for an appointment in the last year OR does the patient have an upcoming appointment? Yes  Can we respond through MyChart? Yes  Agent: Please be advised that Rx refills may take up to 3 business days. We ask that you follow-up with your pharmacy.

## 2023-05-12 ENCOUNTER — Encounter: Payer: Self-pay | Admitting: Physician Assistant

## 2023-05-12 ENCOUNTER — Ambulatory Visit (INDEPENDENT_AMBULATORY_CARE_PROVIDER_SITE_OTHER): Admitting: Physician Assistant

## 2023-05-12 VITALS — BP 136/82 | HR 65 | Temp 97.2°F | Ht 66.0 in | Wt 180.0 lb

## 2023-05-12 DIAGNOSIS — R7303 Prediabetes: Secondary | ICD-10-CM | POA: Diagnosis not present

## 2023-05-12 DIAGNOSIS — M2550 Pain in unspecified joint: Secondary | ICD-10-CM | POA: Diagnosis not present

## 2023-05-12 DIAGNOSIS — M0579 Rheumatoid arthritis with rheumatoid factor of multiple sites without organ or systems involvement: Secondary | ICD-10-CM

## 2023-05-12 DIAGNOSIS — F419 Anxiety disorder, unspecified: Secondary | ICD-10-CM | POA: Diagnosis not present

## 2023-05-12 DIAGNOSIS — E781 Pure hyperglyceridemia: Secondary | ICD-10-CM

## 2023-05-12 MED ORDER — MUPIROCIN 2 % EX OINT: 1.0000 | TOPICAL_OINTMENT | Freq: Two times a day (BID) | CUTANEOUS | 0 refills | Status: AC

## 2023-05-12 MED ORDER — ESCITALOPRAM OXALATE 10 MG PO TABS: 10.0000 mg | ORAL_TABLET | Freq: Every day | ORAL | 0 refills | Status: DC

## 2023-05-12 NOTE — Progress Notes (Signed)
 Patient ID: Donna Bullock, female    DOB: 31-Oct-1955, 68 y.o.   MRN: [id number]   Assessment & Plan:  High triglycerides -     Lipid panel; Future  Prediabetes  Anxiety -     Escitalopram Oxalate; Take 1 tablet (10 mg total) by mouth daily.  Dispense: 90 tablet; Refill: 0  Arthralgia of multiple joints  Rheumatoid arthritis involving multiple sites with positive rheumatoid factor (HCC)  Other orders -     Mupirocin; Apply 1 Application topically 2 (two) times daily.  Dispense: 22 g; Refill: 0      Assessment and Plan Assessment & Plan Rheumatoid Arthritis Rheumatoid arthritis with a history of severe flares. Currently undergoing a series of infusions, with the third infusion scheduled for tomorrow. Reports improvement in symptoms, though still occasionally uses ice packs for relief. She is hopeful about the current treatment plan and acknowledges the chronic nature of the condition. She has considered discontinuing treatment due to concerns about medication toxicity but is encouraged by recent improvements. - Continue current infusion therapy as scheduled - Follow up with rheumatologist as needed  Hypertriglyceridemia Triglycerides elevated at 434 mg/dL, non-fasting. She will return for a fasting lipid panel to reassess levels. Discussed the importance of exercise in managing triglycerides and the potential impact of alcohol consumption, though she reports minimal intake. She is aware that exercise is more effective than dietary changes in lowering triglycerides. - Order fasting lipid panel in the next week or two - Encourage regular exercise to help lower triglyceride levels  Prediabetes A1c at 6.3%, indicating prediabetes. She is actively working on lifestyle and dietary changes, including reducing sugar, carbohydrates, and fat intake. Weight loss efforts are ongoing, with a reported weight loss from 180 lbs to 177 lbs over four weeks. She is motivated by personal goals  related to body image and health. - Continue lifestyle and dietary modifications - Monitor A1c levels in future visits  Anxiety Anxiety well-managed on Lexapro 10 mg daily. She reports stability with this medication. - Continue Lexapro 10 mg daily      Return in about 6 months (around 11/12/2023) for recheck/follow-up.    Subjective:    Chief Complaint  Patient presents with   Medical Management of Chronic Issues    Pt in office for follow up with PCP; pt states been on strict diet for 4 weeks and lost 10 lbs so far; pt has completed allergy tests and will discuss; on new med for RA and seems to be working; pt needs refill of generic lexapro and was told by Cardio needs to be filled by PCP;     HPI Discussed the use of AI scribe software for clinical note transcription with the patient, who gave verbal consent to proceed.  History of Present Illness Donna Bullock is a 68 year old female with rheumatoid arthritis who presents for follow-up after a medication change.  She is currently on her eighth medication for rheumatoid arthritis and is undergoing a series of three infusions, with the third one scheduled for tomorrow. She notes improvement in her symptoms, although she still occasionally uses ice packs at night. Her thumb is experiencing discomfort, but she manages it with a brace to avoid strain.  Her triglyceride levels were elevated at 434 mg/dL during her last non-fasting lab test. Exercise helps in managing her triglyceride levels, and she denies frequent alcohol consumption, stating she only has a glass of wine every six weeks.  She is actively working  on weight loss and dietary changes, having cut out sugar, carbs, and reduced fat intake to 15-20 grams per day. She started at 180 pounds four weeks ago and is now down to 177 pounds. She feels frustrated with the pace of weight loss but remains committed to her dietary changes.  She has a history of anxiety and is stable  on Lexapro 10 mg daily. She uses mupirocin ointment for cuts and abrasions due to frequent gardening activities.     Past Medical History:  Diagnosis Date   Arthritis    rheumatoid arthritis, and osteoarthritis    Benign tumor of adrenal gland    CAD (coronary artery disease)    Inferior MI 1996 tx with bare metal stent RCA. Repeat  anterior MI 2002 with LAD vasospasm. Repeat inferior MI 2003 with occlusion RCA with overlapping Cypher DES in RCA. Repeat cath 2003 with profound vasospasm LAd and Circumflex. Repeat cath 2007  with patent RCA and no disease in LAD or Circumflex.   Chronic pain    Depression    Environmental allergies    Family history of adverse reaction to anesthesia    mother had ? problem with anesthesia - was alcoholic - "mind was never right again"   Family history of breast cancer    Family history of leukemia    Family history of stomach cancer    Fibromyalgia    GERD (gastroesophageal reflux disease)    Heart murmur    Heart murmur    History of kidney stones    History of stomach ulcers    due to taking aspirin for headaches   HTN (hypertension)    Hyperlipidemia    Macular degeneration disease    Myocardial infarction (HCC)    times 4 "very minor" stents x2    Myocardial infarction (HCC)    Osteopenia    Osteoporosis    Rheumatic arteritis    Rotator cuff tear    left   Sleep apnea    uses c pap - does not know settings    Urinary incontinence     Past Surgical History:  Procedure Laterality Date   ABDOMINAL HYSTERECTOMY     BACK SURGERY     CYSTOSCOPY WITH URETEROSCOPY AND STENT PLACEMENT Left 08/24/2019   Procedure: CYSTOSCOPY WITH DIAGNOSTIC URETEROSCOPY , REMOVAL OF STONES AND LEFT  STENT PLACEMENT;  Surgeon: Crist Fat, MD;  Location: [facility];  Service: Urology;  Laterality: Left;   LAPAROSCOPIC LYSIS OF ADHESIONS     LEFT HEART CATH AND CORONARY ANGIOGRAPHY N/A 11/16/2017   Procedure: LEFT HEART CATH AND CORONARY ANGIOGRAPHY;   Surgeon: Lyn Records, MD;  Location: [facility];  Service: Cardiovascular;  Laterality: N/A;   MYOMECTOMY     age 77   PELVIC ABCESS DRAINAGE     SHOULDER OPEN ROTATOR CUFF REPAIR Left 11/16/2014   Procedure: LEFT MINI OPEN ROTATOR CUFF REPAIR SHOULDER OPEN WITH SUBACROMIAL DECOMPRESSION;  Surgeon: Jene Every, MD;  Location: [facility];  Service: Orthopedics;  Laterality: Left;   TOTAL HIP ARTHROPLASTY Right 11/05/2021   Procedure: TOTAL HIP ARTHROPLASTY ANTERIOR APPROACH;  Surgeon: Durene Romans, MD;  Location: [facility];  Service: Orthopedics;  Laterality: Right;    Family History  Problem Relation Age of Onset   Alcohol abuse Father    Lung cancer Father    Cancer - Lung Father    Alcohol abuse Mother    Breast cancer Mother 40   Breast cancer Maternal Aunt 97  Breast cancer Maternal Grandmother 34   Breast cancer Maternal Aunt 72   Breast cancer Cousin 55   Leukemia Sister 46   Stomach cancer Paternal Aunt 60   Cancer Cousin 58       type unk    Social History   Tobacco Use   Smoking status: Some Days    Types: E-cigarettes    Passive exposure: Past   Smokeless tobacco: Never   Tobacco comments:    currently using e-cigs  Vaping Use   Vaping status: Some Days  Substance Use Topics   Alcohol use: Yes    Comment: wine once every 2 months   Drug use: No     Allergies  Allergen Reactions   Cat Dander    Orencia [Abatacept] Hives   Other Hives and Diarrhea    Peppers    Tomato Diarrhea   Valium Other (See Comments)    Altered mental state   Enbrel [Etanercept] Rash    Review of Systems NEGATIVE UNLESS OTHERWISE INDICATED IN HPI      Objective:     BP 136/82 (BP Location: Left Arm, Patient Position: Sitting, Cuff Size: Normal)   Pulse 65   Temp (!) 97.2 F (36.2 C) (Temporal)   Ht 5\' 6"  (1.676 m)   Wt 180 lb (81.6 kg)   LMP 02/18/1995   SpO2 99%   BMI 29.05 kg/m   Wt Readings from Last 3 Encounters:  05/12/23 180 lb (81.6 kg)  04/14/23  182 lb 8 oz (82.8 kg)  03/30/23 188 lb 6.4 oz (85.5 kg)    BP Readings from Last 3 Encounters:  05/12/23 136/82  04/14/23 (!) 144/72  03/30/23 (!) 162/88     Physical Exam Vitals and nursing note reviewed.  Constitutional:      Appearance: Normal appearance. She is normal weight. She is not toxic-appearing.  HENT:     Head: Normocephalic and atraumatic.     Right Ear: External ear normal.     Left Ear: External ear normal.     Mouth/Throat:     Mouth: Mucous membranes are moist.  Eyes:     Extraocular Movements: Extraocular movements intact.     Conjunctiva/sclera: Conjunctivae normal.     Pupils: Pupils are equal, round, and reactive to light.  Cardiovascular:     Rate and Rhythm: Normal rate and regular rhythm.     Pulses: Normal pulses.     Heart sounds: Normal heart sounds.  Pulmonary:     Effort: Pulmonary effort is normal.     Breath sounds: Normal breath sounds.  Musculoskeletal:        General: Swelling and tenderness present. Normal range of motion.     Cervical back: Normal range of motion and neck supple.     Comments: Bilateral hands swelling & TTP noted around mcp joints  Skin:    General: Skin is warm and dry.     Findings: No lesion or rash.  Neurological:     General: No focal deficit present.     Mental Status: She is alert and oriented to person, place, and time.  Psychiatric:        Mood and Affect: Mood normal.        Behavior: Behavior normal.             Azani Brogdon M Quan Cybulski, PA-C

## 2023-05-13 ENCOUNTER — Encounter: Payer: Self-pay | Admitting: Obstetrics and Gynecology

## 2023-05-15 ENCOUNTER — Other Ambulatory Visit

## 2023-05-15 ENCOUNTER — Encounter: Payer: Self-pay | Admitting: Physician Assistant

## 2023-05-15 DIAGNOSIS — E781 Pure hyperglyceridemia: Secondary | ICD-10-CM

## 2023-05-15 LAB — LIPID PANEL
Cholesterol: 102 mg/dL (ref 0–200)
HDL: 47.1 mg/dL (ref >39.00)
LDL Cholesterol: 36 mg/dL (ref 0–99)
NonHDL: 55.29
Total CHOL/HDL Ratio: 2
Triglycerides: 94 mg/dL (ref 0.0–149.0)
VLDL: 18.8 mg/dL (ref 0.0–40.0)

## 2023-06-01 ENCOUNTER — Ambulatory Visit (INDEPENDENT_AMBULATORY_CARE_PROVIDER_SITE_OTHER)

## 2023-06-01 VITALS — Ht 67.0 in | Wt 173.0 lb

## 2023-06-01 DIAGNOSIS — Z0000 Encounter for general adult medical examination without abnormal findings: Secondary | ICD-10-CM

## 2023-06-01 NOTE — Progress Notes (Signed)
 Subjective:   Donna Bullock is a 68 y.o. who presents for a Medicare Wellness preventive visit.  Visit Complete: Virtual I connected with  [name] on 06/01/23 by a audio enabled telemedicine application and verified that I am speaking with the correct person using two identifiers.  Patient Location: Home  Provider Location: Office/Clinic  I discussed the limitations of evaluation and management by telemedicine. The patient expressed understanding and agreed to proceed.  Vital Signs: Because this visit was a virtual/telehealth visit, some criteria may be missing or patient reported. Any vitals not documented were not able to be obtained and vitals that have been documented are patient reported.  VideoDeclined- This patient declined Librarian, academic. Therefore the visit was completed with audio only.  Persons Participating in Visit: Patient.  AWV Questionnaire: No: Patient Medicare AWV questionnaire was not completed prior to this visit.  Cardiac Risk Factors include: advanced age (>108men, >74 women);dyslipidemia;hypertension     Objective:    Today's Vitals   06/01/23 0927 06/01/23 0928  Weight: 173 lb (78.5 kg)   Height: 5\' 7"  (1.702 m)   PainSc:  5    Body mass index is 27.1 kg/m.     06/01/2023    9:35 AM 02/27/2022   11:04 AM 02/24/2022    2:34 PM 11/05/2021   12:15 PM 10/25/2021   10:27 AM 06/18/2019    2:31 PM 11/16/2017    6:17 AM  Advanced Directives  Does Patient Have a Medical Advance Directive? No No No No No No Yes  Type of Tax inspector;Living will  Does patient want to make changes to medical advance directive?       No - Patient declined  Copy of Healthcare Power of Attorney in Chart?       No - copy requested  Would patient like information on creating a medical advance directive? No - Patient declined Yes (ED - Information included in AVS)  No - Patient declined  Yes (ED -  Information included in AVS)     Current Medications (verified) Outpatient Encounter Medications as of 06/01/2023  Medication Sig   Artificial Tear Solution (SOOTHE XP) SOLN Place 1 drop into both eyes daily as needed (dry eyes).   azelastine (ASTELIN) 0.1 % nasal spray Place 1-2 sprays into both nostrils 2 (two) times daily as needed (nasal drainage). Use in each nostril as directed   b complex vitamins tablet Take 1 tablet by mouth daily.   bevacizumab (AVASTIN) 2.5 mg/0.1 mL SOLN intravitreal injection 0.625 mg by Intravitreal route once.   Calcium Citrate-Vitamin D (CITRACAL + D PO) Take 1 tablet by mouth 2 (two) times daily.   clopidogrel (PLAVIX) 75 MG tablet Take 1 tablet (75 mg total) by mouth daily.   diltiazem (DILTIAZEM CD) 120 MG 24 hr capsule Take 1 capsule (120 mg total) by mouth daily.   diphenhydrAMINE (BENADRYL) 25 MG tablet Take 1 tablet (25 mg total) by mouth every 6 (six) hours as needed.   escitalopram (LEXAPRO) 10 MG tablet Take 1 tablet (10 mg total) by mouth daily.   isosorbide mononitrate (IMDUR) 30 MG 24 hr tablet Take 0.5 tablets (15 mg total) by mouth at bedtime.   levocetirizine (XYZAL) 5 MG tablet Take 1 tablet (5 mg total) by mouth every evening.   lisinopril (ZESTRIL) 20 MG tablet TAKE 1 TABLET BY MOUTH DAILY   metoprolol succinate (TOPROL-XL) 25 MG 24 hr  tablet Take 1 tablet (25 mg total) by mouth daily.   Multiple Vitamins-Minerals (CENTRUM SILVER PO) Take 1 tablet by mouth daily.    mupirocin ointment (BACTROBAN) 2 % Apply 1 Application topically 2 (two) times daily.   MYRBETRIQ 50 MG TB24 tablet Take 50 mg by mouth daily.   omeprazole (PRILOSEC) 20 MG capsule Take 1 capsule (20 mg total) by mouth daily.   rosuvastatin (CRESTOR) 10 MG tablet Take 1 tablet (10 mg total) by mouth daily.   [DISCONTINUED] fluticasone (FLONASE) 50 MCG/ACT nasal spray Place 1-2 sprays into both nostrils daily as needed (nasal congestion).   No facility-administered encounter  medications on file as of 06/01/2023.    Allergies (verified) Cat dander, Orencia [abatacept], Other, Tomato, Valium, and Enbrel [etanercept]   History: Past Medical History:  Diagnosis Date   Arthritis    rheumatoid arthritis, and osteoarthritis    Benign tumor of adrenal gland    CAD (coronary artery disease)    Inferior MI 1996 tx with bare metal stent RCA. Repeat  anterior MI 2002 with LAD vasospasm. Repeat inferior MI 2003 with occlusion RCA with overlapping Cypher DES in RCA. Repeat cath 2003 with profound vasospasm LAd and Circumflex. Repeat cath 2007  with patent RCA and no disease in LAD or Circumflex.   Chronic pain    Depression    Environmental allergies    Family history of adverse reaction to anesthesia    mother had ? problem with anesthesia - was alcoholic - "mind was never right again"   Family history of breast cancer    Family history of leukemia    Family history of stomach cancer    Fibromyalgia    GERD (gastroesophageal reflux disease)    Heart murmur    Heart murmur    History of kidney stones    History of stomach ulcers    due to taking aspirin for headaches   HTN (hypertension)    Hyperlipidemia    Macular degeneration disease    Myocardial infarction (HCC)    times 4 "very minor" stents x2    Myocardial infarction (HCC)    Osteopenia    Osteoporosis    Rheumatic arteritis    Rotator cuff tear    left   Sleep apnea    uses c pap - does not know settings    Urinary incontinence    Past Surgical History:  Procedure Laterality Date   ABDOMINAL HYSTERECTOMY     BACK SURGERY     CYSTOSCOPY WITH URETEROSCOPY AND STENT PLACEMENT Left 08/24/2019   Procedure: CYSTOSCOPY WITH DIAGNOSTIC URETEROSCOPY , REMOVAL OF STONES AND LEFT  STENT PLACEMENT;  Surgeon: Andrez Banker, MD;  Location: [facility];  Service: Urology;  Laterality: Left;   LAPAROSCOPIC LYSIS OF ADHESIONS     LEFT HEART CATH AND CORONARY ANGIOGRAPHY N/A 11/16/2017   Procedure: LEFT HEART  CATH AND CORONARY ANGIOGRAPHY;  Surgeon: Arty Binning, MD;  Location: [facility];  Service: Cardiovascular;  Laterality: N/A;   MYOMECTOMY     age 61   PELVIC ABCESS DRAINAGE     SHOULDER OPEN ROTATOR CUFF REPAIR Left 11/16/2014   Procedure: LEFT MINI OPEN ROTATOR CUFF REPAIR SHOULDER OPEN WITH SUBACROMIAL DECOMPRESSION;  Surgeon: Orvan Blanch, MD;  Location: [facility];  Service: Orthopedics;  Laterality: Left;   TOTAL HIP ARTHROPLASTY Right 11/05/2021   Procedure: TOTAL HIP ARTHROPLASTY ANTERIOR APPROACH;  Surgeon: Claiborne Crew, MD;  Location: [facility];  Service: Orthopedics;  Laterality: Right;  Family History  Problem Relation Age of Onset   Alcohol abuse Father    Lung cancer Father    Cancer - Lung Father    Alcohol abuse Mother    Breast cancer Mother 71   Breast cancer Maternal Aunt 62   Breast cancer Maternal Grandmother 30   Breast cancer Maternal Aunt 72   Breast cancer Cousin 17   Leukemia Sister 78   Stomach cancer Paternal Aunt 57   Cancer Cousin 60       type unk   Social History   Socioeconomic History   Marital status: Widowed    Spouse name: Not on file   Number of children: 0   Years of education: 73   Highest education level: Not on file  Occupational History    Employer: [employer]  Tobacco Use   Smoking status: Some Days    Types: E-cigarettes    Passive exposure: Past   Smokeless tobacco: Never   Tobacco comments:    currently using e-cigs  Vaping Use   Vaping status: Some Days  Substance and Sexual Activity   Alcohol use: Not Currently    Comment: wine once every 2 months   Drug use: No   Sexual activity: Not Currently    Partners: Male    Birth control/protection: Surgical    Comment: TAH  Other Topics Concern   Not on file  Social History Narrative   Not on file   Social Drivers of Health   Financial Resource Strain: Low Risk  (06/01/2023)   Overall Financial Resource Strain (CARDIA)    Difficulty of Paying Living  Expenses: Not hard at all  Food Insecurity: No Food Insecurity (06/01/2023)   Hunger Vital Sign    Worried About Running Out of Food in the Last Year: Never true    Ran Out of Food in the Last Year: Never true  Transportation Needs: No Transportation Needs (06/01/2023)   PRAPARE - Administrator, Civil Service (Medical): No    Lack of Transportation (Non-Medical): No  Physical Activity: Sufficiently Active (06/01/2023)   Exercise Vital Sign    Days of Exercise per Week: 4 days    Minutes of Exercise per Session: 60 min  Stress: No Stress Concern Present (06/01/2023)   [employer] of Occupational Health - Occupational Stress Questionnaire    Feeling of Stress : Only a little  Social Connections: Moderately Isolated (06/01/2023)   Social Connection and Isolation Panel [NHANES]    Frequency of Communication with Friends and Family: More than three times a week    Frequency of Social Gatherings with Friends and Family: More than three times a week    Attends Religious Services: Never    Database administrator or Organizations: Yes    Attends Banker Meetings: 1 to 4 times per year    Marital Status: Widowed    Tobacco Counseling Ready to quit: Not Answered Counseling given: Not Answered Tobacco comments: currently using e-cigs    Clinical Intake:  Pre-visit preparation completed: Yes  Pain : 0-10 Pain Score: 5  Pain Type: Chronic pain Pain Location: Generalized Pain Descriptors / Indicators: Aching Pain Onset: More than a month ago Pain Frequency: Intermittent     BMI - recorded: 27.1 Nutritional Status: BMI 25 -29 Overweight Nutritional Risks: None Diabetes: No  Lab Results  Component Value Date   HGBA1C 6.3 03/30/2023     How often do you need to have someone help you  when you read instructions, pamphlets, or other written materials from your doctor or pharmacy?: 1 - Never  Interpreter Needed?: No  Information entered by :: Lamont Pilsner, LPN   Activities of Daily Living     06/01/2023    9:29 AM  In your present state of health, do you have any difficulty performing the following activities:  Hearing? 0  Vision? 0  Difficulty concentrating or making decisions? 0  Walking or climbing stairs? 1  Comment at times sob  Dressing or bathing? 0  Doing errands, shopping? 0  Preparing Food and eating ? N  Using the Toilet? N  In the past six months, have you accidently leaked urine? Y  Comment wears a pad  Do you have problems with loss of bowel control? N  Managing your Medications? N  Managing your Finances? N  Housekeeping or managing your Housekeeping? N    Patient Care Team: Allwardt, Alyssa M, PA-C as PCP - General (Physician Assistant) Sheryle Donning, MD as PCP - Cardiology (Cardiology) Tami Falcon, MD as Consulting Physician (Gastroenterology) Aryal, Govinda, MD (Rheumatology)  Indicate any recent Medical Services you may have received from other than [facility] providers in the past year (date may be approximate).     Assessment:   This is a routine wellness examination for Myan.  Hearing/Vision screen Hearing Screening - Comments:: Pt stated HOH will follow up with audiologist  Vision Screening - Comments:: Wears rx glasses - up to date with routine eye exams with [place] Dr Lenon Radar     Goals Addressed             This Visit's Progress    Patient Stated       Continue to work on losing weight        Depression Screen      06/01/2023    9:33 AM 03/30/2023   11:34 AM  PHQ 2/9 Scores  PHQ - 2 Score 1 0    Fall Risk      06/01/2023    9:36 AM 03/30/2023   11:34 AM  Fall Risk   Falls in the past year? 0 0  Number falls in past yr: 0 0  Injury with Fall? 0 0  Risk for fall due to : No Fall Risks No Fall Risks  Follow up Falls prevention discussed Falls evaluation completed    MEDICARE RISK AT HOME:   Medicare Risk at Home Any stairs in or around the home?:  Yes If so, are there any without handrails?: No Home free of loose throw rugs in walkways, pet beds, electrical cords, etc?: Yes Adequate lighting in your home to reduce risk of falls?: Yes Life alert?: No Use of a cane, walker or w/c?: No Grab bars in the bathroom?: Yes Shower chair or bench in shower?: No Elevated toilet seat or a handicapped toilet?: No  TIMED UP AND GO:  Was the test performed?  No  Cognitive Function: 6CIT completed        06/01/2023    9:37 AM  6CIT Screen  What Year? 0 points  What month? 0 points  What time? 0 points  Count back from 20 0 points  Months in reverse 0 points  Repeat phrase 0 points  Total Score 0 points    Immunizations Immunization History  Administered Date(s) Administered   Fluad Quad(high Dose 65+) 12/05/2021   Influenza, High Dose Seasonal PF 12/19/2022   PFIZER Comirnaty(Gray Top)Covid-19 Tri-Sucrose Vaccine 06/01/2020   PFIZER(Purple Top)SARS-COV-2 Vaccination  03/14/2019, 04/04/2019, 10/10/2019   Pfizer Covid-19 Vaccine Bivalent Booster 44yrs & up 11/23/2020   Pfizer(Comirnaty)Fall Seasonal Vaccine 12 years and older 12/05/2021, 12/19/2022    Screening Tests Health Maintenance  Topic Date Due   Zoster Vaccines- Shingrix (1 of 2) 08/12/2023 (Originally 09/03/1974)   DTaP/Tdap/Td (1 - Tdap) 05/11/2024 (Originally 09/03/1974)   Pneumonia Vaccine 24+ Years old (1 of 2 - PCV) 05/11/2024 (Originally 09/03/1974)   Hepatitis C Screening  05/11/2024 (Originally 09/04/1973)   COVID-19 Vaccine (8 - Pfizer risk 2024-25 season) 06/18/2023   INFLUENZA VACCINE  09/18/2023   Medicare Annual Wellness (AWV)  05/31/2024   MAMMOGRAM  04/21/2025   Colonoscopy  05/06/2032   DEXA SCAN  Completed   HPV VACCINES  Aged Out   Meningococcal B Vaccine  Aged Out    Health Maintenance  There are no preventive care reminders to display for this patient. Health Maintenance Items Addressed: See Nurse Notes  Additional Screening:  Vision  Screening: Recommended annual ophthalmology exams for early detection of glaucoma and other disorders of the eye.  Dental Screening: Recommended annual dental exams for proper oral hygiene  Community Resource Referral / Chronic Care Management: CRR required this visit?  No   CCM required this visit?  No     Plan:     I have personally reviewed and noted the following in the patient's chart:   Medical and social history Use of alcohol, tobacco or illicit drugs  Current medications and supplements including opioid prescriptions. Patient is not currently taking opioid prescriptions. Functional ability and status Nutritional status Physical activity Advanced directives List of other physicians Hospitalizations, surgeries, and ER visits in previous 12 months Vitals Screenings to include cognitive, depression, and falls Referrals and appointments  In addition, I have reviewed and discussed with patient certain preventive protocols, quality metrics, and best practice recommendations. A written personalized care plan for preventive services as well as general preventive health recommendations were provided to patient.     Bruno Capri, LPN   05/26/8117   After Visit Summary: (MyChart) Due to this being a telephonic visit, the after visit summary with patients personalized plan was offered to patient via MyChart   Notes: Nothing significant to report at this time.

## 2023-06-01 NOTE — Patient Instructions (Signed)
 Donna Bullock , Thank you for taking time to come for your Medicare Wellness Visit. I appreciate your ongoing commitment to your health goals. Please review the following plan we discussed and let me know if I can assist you in the future.   Referrals/Orders/Follow-Ups/Clinician Recommendations: continue losing weight   This is a list of the screening recommended for you and due dates:  Health Maintenance  Topic Date Due   Zoster (Shingles) Vaccine (1 of 2) 08/12/2023*   DTaP/Tdap/Td vaccine (1 - Tdap) 05/11/2024*   Pneumonia Vaccine (1 of 2 - PCV) 05/11/2024*   Hepatitis C Screening  05/11/2024*   COVID-19 Vaccine (8 - Pfizer risk 2024-25 season) 06/18/2023   Flu Shot  09/18/2023   Medicare Annual Wellness Visit  05/31/2024   Mammogram  04/21/2025   Colon Cancer Screening  05/06/2032   DEXA scan (bone density measurement)  Completed   HPV Vaccine  Aged Out   Meningitis B Vaccine  Aged Out  *Topic was postponed. The date shown is not the original due date.    Advanced directives: (Declined) Advance directive discussed with you today. Even though you declined this today, please call our office should you change your mind, and we can give you the proper paperwork for you to fill out.  Next Medicare Annual Wellness Visit scheduled for next year: Yes

## 2023-06-11 ENCOUNTER — Ambulatory Visit: Admitting: Allergy

## 2023-06-11 ENCOUNTER — Encounter: Payer: Self-pay | Admitting: Allergy

## 2023-06-11 ENCOUNTER — Other Ambulatory Visit: Payer: Self-pay

## 2023-06-11 ENCOUNTER — Ambulatory Visit: Payer: Medicare Other | Admitting: Allergy

## 2023-06-11 VITALS — BP 136/80 | HR 64 | Temp 98.4°F | Resp 18 | Ht 66.0 in | Wt 172.4 lb

## 2023-06-11 DIAGNOSIS — J302 Other seasonal allergic rhinitis: Secondary | ICD-10-CM | POA: Diagnosis not present

## 2023-06-11 DIAGNOSIS — T781XXD Other adverse food reactions, not elsewhere classified, subsequent encounter: Secondary | ICD-10-CM

## 2023-06-11 DIAGNOSIS — J3089 Other allergic rhinitis: Secondary | ICD-10-CM | POA: Diagnosis not present

## 2023-06-11 DIAGNOSIS — H1013 Acute atopic conjunctivitis, bilateral: Secondary | ICD-10-CM | POA: Diagnosis not present

## 2023-06-11 MED ORDER — AZELASTINE HCL 0.1 % NA SOLN: 2.0000 | Freq: Two times a day (BID) | NASAL | 5 refills | Status: AC | PRN

## 2023-06-11 MED ORDER — AZELASTINE HCL 0.05 % OP SOLN: 1.0000 [drp] | Freq: Two times a day (BID) | OPHTHALMIC | 5 refills | Status: AC | PRN

## 2023-06-11 MED ORDER — LEVOCETIRIZINE DIHYDROCHLORIDE 5 MG PO TABS: 5.0000 mg | ORAL_TABLET | Freq: Every evening | ORAL | 5 refills | Status: DC

## 2023-06-11 NOTE — Progress Notes (Signed)
 Follow-up Note  RE: Donna Bullock MRN: [id number] DOB: Jun 18, 1955 Date of Office Visit: 06/11/2023   History of present illness: Donna Bullock is a 68 y.o. female presenting today for follow-up of allergic rhinitis with conjunctivitis and adverse food reaction.  She was last seen in the office on 04/14/2023 by Dr. Burdette Bullock. Discussed the use of AI scribe software for clinical note transcription with the patient, who gave verbal consent to proceed.  She experiences persistent allergy symptoms, including constant nasal discharge and significant eye irritation with itching and redness. She uses a nasal spray and takes Xyzal  daily for her allergies. The nasal spray has been effective, but she is confused about the different types she has been prescribed. She uses one spray in the morning and at night but ran out and is unsure about the other bottle she has.  It does not appear upon further discussion that she has been using the azelastine  spray but she does have the Flonase  at home now.  She does not currently have an allergy based eyedrop to use.  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 Despite this, her eyes become extremely irritated, especially when working outside. She uses artificial tears up to twenty times a day, which provide temporary relief. Her eyelids are drooping onto her eyelashes, which she believes exacerbates her eye issues.  She states her insurance will cover a eyelid lift.  She continues to avoid peppers, tomatoes which were positive on serum IgE  testing. She has been aware of these food allergies for over twenty-five years.     Review of systems: 10pt ROS negative unless noted above in HPI  Past medical/social/surgical/family history have been reviewed and are unchanged unless specifically indicated below.  No changes  Medication List: Current Outpatient Medications  Medication Sig Dispense Refill    Artificial Tear Solution (SOOTHE XP) SOLN Place 1 drop into both eyes daily as needed (dry eyes).     azelastine  (ASTELIN ) 0.1 % nasal spray Place 1-2 sprays into both nostrils 2 (two) times daily as needed (nasal drainage). Use in each nostril as directed 30 mL 2   b complex vitamins tablet Take 1 tablet by mouth daily.     bevacizumab (AVASTIN) 2.5 mg/0.1 mL SOLN intravitreal injection 0.625 mg by Intravitreal route once.     Calcium  Citrate-Vitamin D (CITRACAL + D PO) Take 1 tablet by mouth 2 (two) times daily.     clopidogrel  (PLAVIX ) 75 MG tablet Take 1 tablet (75 mg total) by mouth daily. 90 tablet 2   diltiazem  (DILTIAZEM  CD) 120 MG 24 hr capsule Take 1 capsule (120 mg total) by mouth daily. 90 capsule 2   diphenhydrAMINE  (BENADRYL ) 25 MG tablet Take 1 tablet (25 mg total) by mouth every 6 (six) hours as needed. 30 tablet 0   escitalopram  (LEXAPRO ) 10 MG tablet Take 1 tablet (10 mg total) by mouth daily. 90 tablet 0   isosorbide  mononitrate (IMDUR ) 30 MG 24 hr tablet Take 0.5 tablets (15 mg total) by mouth at bedtime. 45 tablet 2   levocetirizine (XYZAL ) 5 MG tablet Take 1 tablet (5 mg total) by mouth every evening. 30 tablet 2   lisinopril  (ZESTRIL ) 20 MG tablet TAKE 1 TABLET BY MOUTH DAILY 90 tablet 3   metoprolol  succinate (TOPROL -XL) 25 MG 24 hr tablet Take 1 tablet (25 mg total) by mouth daily. 90 tablet 3   Multiple Vitamins-Minerals (CENTRUM SILVER PO) Take 1  tablet by mouth daily.      mupirocin  ointment (BACTROBAN ) 2 % Apply 1 Application topically 2 (two) times daily. 22 g 0   MYRBETRIQ  50 MG TB24 tablet Take 50 mg by mouth daily.     omeprazole  (PRILOSEC) 20 MG capsule Take 1 capsule (20 mg total) by mouth daily. 90 capsule 3   rosuvastatin  (CRESTOR ) 10 MG tablet Take 1 tablet (10 mg total) by mouth daily. 90 tablet 2   No current facility-administered medications for this visit.     Known medication allergies: Allergies  Allergen Reactions   Cat Dander    Orencia  [Abatacept] Hives   Other Hives and Diarrhea    Peppers    Tomato Diarrhea   Valium  Other (See Comments)    Altered mental state   Enbrel [Etanercept] Rash     Physical examination: Blood pressure 136/80, pulse 64, temperature 98.4 F (36.9 C), temperature source Temporal, resp. rate 18, height 5\' 6"  (1.676 m), weight 172 lb 6.4 oz (78.2 kg), last menstrual period 02/18/1995, SpO2 98%.  General: Alert, interactive, in no acute distress. HEENT: PERRLA, TMs pearly gray, turbinates non-edematous without discharge, post-pharynx non erythematous. Neck: Supple without lymphadenopathy. Lungs: Clear to auscultation without wheezing, rhonchi or rales. {no increased work of breathing. CV: Normal S1, S2 without murmurs. Abdomen: Nondistended, nontender. Skin: Warm and dry, without lesions or rashes. Extremities:  No clubbing, cyanosis or edema. Neuro:   Grossly intact.  Diagnositics/Labs: Labs:  Component     Latest Ref Rng 04/14/2023  IgE (Immunoglobulin E), Serum     6 - 495 IU/mL 63   D Pteronyssinus IgE     Class 0/I kU/L 0.19 !   D Farinae IgE     Class II kU/L 0.60 !   Cat Dander IgE     Class II kU/L 1.22 !   Dog Dander IgE     Class 0/I kU/L 0.25 !   Mouse Urine IgE     Class 0/I kU/L 0.15 !   [place] Grass IgE     Class III kU/L 2.47 !   Timothy Grass IgE     Class III kU/L 2.14 !   [place] Grass IgE     Class III kU/L 2.08 !   Cockroach, German IgE     Class III kU/L 1.89 !   Penicillium Chrysogen IgE     Class 0 kU/L <0.10   Cladosporium Herbarum IgE     Class 0 kU/L <0.10   Aspergillus Fumigatus IgE     Class 0 kU/L <0.10   Alternaria Alternata IgE     Class 0/I kU/L 0.14 !   Maple/Box Elder IgE     Class III kU/L 1.82 !   Common Silver Amelia Jurist IgE     Class III kU/L 1.72 !   [place], [place] IgE     Class III kU/L 1.64 !   [place], [place] IgE     Class III kU/L 1.84 !   Elm, American IgE     Class III kU/L 2.10 !   [place] IgE     Class III kU/L 1.87 !    Pecan, [place] IgE     Class III kU/L 1.88 !   White Mulberry IgE     Class II kU/L 1.23 !   Ragweed, Short IgE     Class III kU/L 2.35 !   Pigweed, Rough IgE     Class III kU/L 1.79 !   Sheep Sorrel IgE  Qn     Class III kU/L 2.18 !   Allergen Tomato, IgE     Class III kU/L 2.05 !   Allergen Black Pepper IgE     Class II kU/L 1.16 !   [id number] Chili Pepper     kU/L CANCELED   Paprika IgE     Class III kU/L 2.07 !     Assessment and plan: Rhinoconjunctivitis Environmental allergy testing was positive to dust mites, cat dander, dog dander, rodent, grass pollen, tree pollen, weed pollen, cockroach, mold.  Perform avoidance measures as below. Take xyzal  (levocetirizine) 5 mg daily.  May take additional dose if needed for extra control. Use Flonase  (fluticasone ) nasal spray daily for 1-2 weeks at a time before stopping once nasal congestion improves for maximum benefit Use Azelastine  nasal spray 2 sprays per nostril twice a day as needed for runny nose/drainage. With using nasal sprays point tip of bottle toward eye on same side nostril and lean head slightly forward for best technique.   Nasal saline spray (i.e., Simply Saline) or nasal saline lavage (i.e., NeilMed) is recommended as needed and prior to medicated nasal sprays. Use Optivar  1 drop in each eye twice a day as needed for itchy watery eyes.  Recommend checking with your ophthalmologist if this is okay to use with having wet MD  Adverse food reaction Continue to limit tomatoes and peppers. Allergy testing is positive to paprika/green pepper, black pepper and tomato   Follow up in 4 months or sooner if needed.  I appreciate the opportunity to take part in Tishawna's care. Please do not hesitate to contact me with questions.  Sincerely,   Catha Clink, MD Allergy/Immunology [facility]

## 2023-06-11 NOTE — Patient Instructions (Addendum)
 Rhinoconjunctivitis Environmental allergy testing was positive to dust mites, cat dander, dog dander, rodent, grass pollen, tree pollen, weed pollen, cockroach, mold.  Perform avoidance measures as below. Take xyzal  (levocetirizine) 5 mg daily.  May take additional dose if needed for extra control. Use Flonase  (fluticasone ) nasal spray daily for 1-2 weeks at a time before stopping once nasal congestion improves for maximum benefit Use Azelastine  nasal spray 2 sprays per nostril twice a day as needed for runny nose/drainage. With using nasal sprays point tip of bottle toward eye on same side nostril and lean head slightly forward for best technique.   Nasal saline spray (i.e., Simply Saline) or nasal saline lavage (i.e., NeilMed) is recommended as needed and prior to medicated nasal sprays. Use Optivar  1 drop in each eye twice a day as needed for itchy watery eyes.  Recommend checking with your ophthalmologist if this is okay to use with having wet MD  Foods Continue to limit tomatoes and peppers. Allergy testing is positive to paprika/green pepper, black pepper and tomato   Follow up in 4 months or sooner if needed.   Reducing Pollen Exposure Pollen seasons: trees (spring), grass (summer) and ragweed/weeds (fall). Keep windows closed in your home and car to lower pollen exposure.  Install air conditioning in the bedroom and throughout the house if possible.  Avoid going out in dry windy days - especially early morning. Pollen counts are highest between 5 - 10 AM and on dry, hot and windy days.  Save outside activities for late afternoon or after a heavy rain, when pollen levels are lower.  Avoid mowing of grass if you have grass pollen allergy. Be aware that pollen can also be transported indoors on people and pets.  Dry your clothes in an automatic dryer rather than hanging them outside where they might collect pollen.  Rinse hair and eyes before bedtime. Mold Control Mold and fungi can  grow on a variety of surfaces provided certain temperature and moisture conditions exist.  Outdoor molds grow on plants, decaying vegetation and soil. The major outdoor mold, Alternaria and Cladosporium, are found in very high numbers during hot and dry conditions. Generally, a late summer - fall peak is seen for common outdoor fungal spores. Rain will temporarily lower outdoor mold spore count, but counts rise rapidly when the rainy period ends. The most important indoor molds are Aspergillus and Penicillium. Dark, humid and poorly ventilated basements are ideal sites for mold growth. The next most common sites of mold growth are the bathroom and the kitchen. Outdoor (Seasonal) Mold Control Use air conditioning and keep windows closed. Avoid exposure to decaying vegetation. Avoid leaf raking. Avoid grain handling. Consider wearing a face mask if working in moldy areas.  Indoor (Perennial) Mold Control  Maintain humidity below 50%. Get rid of mold growth on hard surfaces with water , detergent and, if necessary, 5% bleach (do not mix with other cleaners). Then dry the area completely. If mold covers an area more than 10 square feet, consider hiring an indoor environmental professional. For clothing, washing with soap and water  is best. If moldy items cannot be cleaned and dried, throw them away. Remove sources e.g. contaminated carpets. Repair and seal leaking roofs or pipes. Using dehumidifiers in damp basements may be helpful, but empty the water  and clean units regularly to prevent mildew from forming. All rooms, especially basements, bathrooms and kitchens, require ventilation and cleaning to deter mold and mildew growth. Avoid carpeting on concrete or damp floors, and storing  items in damp areas. Control of House Dust Mite Allergen Dust mite allergens are a common trigger of allergy and asthma symptoms. While they can be found throughout the house, these microscopic creatures thrive in warm,  humid environments such as bedding, upholstered furniture and carpeting. Because so much time is spent in the bedroom, it is essential to reduce mite levels there.  Encase pillows, mattresses, and box springs in special allergen-proof fabric covers or airtight, zippered plastic covers.  Bedding should be washed weekly in hot water  (130 F) and dried in a hot dryer. Allergen-proof covers are available for comforters and pillows that can't be regularly washed.  Wash the allergy-proof covers every few months. Minimize clutter in the bedroom. Keep pets out of the bedroom.  Keep humidity less than 50% by using a dehumidifier or air conditioning. You can buy a humidity measuring device called a hygrometer to monitor this.  If possible, replace carpets with hardwood, linoleum, or washable area rugs. If that's not possible, vacuum frequently with a vacuum that has a HEPA filter. Remove all upholstered furniture and non-washable window drapes from the bedroom. Remove all non-washable stuffed toys from the bedroom.  Wash stuffed toys weekly. Pet Allergen Avoidance: Contrary to popular opinion, there are no "hypoallergenic" breeds of dogs or cats. That is because people are not allergic to an animal's hair, but to an allergen found in the animal's saliva, dander (dead skin flakes) or urine. Pet allergy symptoms typically occur within minutes. For some people, symptoms can build up and become most severe 8 to 12 hours after contact with the animal. People with severe allergies can experience reactions in public places if dander has been transported on the pet owners' clothing. Keeping an animal outdoors is only a partial solution, since homes with pets in the yard still have higher concentrations of animal allergens. Before getting a pet, ask your allergist to determine if you are allergic to animals. If your pet is already considered part of your family, try to minimize contact and keep the pet out of the bedroom  and other rooms where you spend a great deal of time. As with dust mites, vacuum carpets often or replace carpet with a hardwood floor, tile or linoleum. High-efficiency particulate air (HEPA) cleaners can reduce allergen levels over time. While dander and saliva are the source of cat and dog allergens, urine is the source of allergens from rabbits, hamsters, mice and [place] pigs; so ask a non-allergic family member to clean the animal's cage. If you have a pet allergy, talk to your allergist about the potential for allergy immunotherapy (allergy shots). This strategy can often provide long-term relief. Cockroach Allergen Avoidance Cockroaches are often found in the homes of densely populated urban areas, schools or commercial buildings, but these creatures can lurk almost anywhere. This does not mean that you have a dirty house or living area. Block all areas where roaches can enter the home. This includes crevices, wall cracks and windows.  Cockroaches need water  to survive, so fix and seal all leaky faucets and pipes. Have an exterminator go through the house when your family and pets are gone to eliminate any remaining roaches. Keep food in lidded containers and put pet food dishes away after your pets are done eating. Vacuum and sweep the floor after meals, and take out garbage and recyclables. Use lidded garbage containers in the kitchen. Wash dishes immediately after use and clean under stoves, refrigerators or toasters where crumbs can accumulate. Wipe off the  stove and other kitchen surfaces and cupboards regularly.

## 2023-07-17 ENCOUNTER — Other Ambulatory Visit: Payer: Self-pay

## 2023-07-17 MED ORDER — DILTIAZEM HCL ER COATED BEADS 120 MG PO CP24: 120.0000 mg | ORAL_CAPSULE | Freq: Every day | ORAL | 2 refills | Status: AC

## 2023-07-23 ENCOUNTER — Other Ambulatory Visit: Payer: Self-pay | Admitting: Physician Assistant

## 2023-07-27 ENCOUNTER — Ambulatory Visit (HOSPITAL_BASED_OUTPATIENT_CLINIC_OR_DEPARTMENT_OTHER): Payer: Medicare Other | Admitting: Cardiology

## 2023-07-27 ENCOUNTER — Encounter (HOSPITAL_BASED_OUTPATIENT_CLINIC_OR_DEPARTMENT_OTHER): Payer: Self-pay | Admitting: Cardiology

## 2023-07-27 VITALS — BP 140/86 | HR 57 | Ht 67.0 in | Wt 165.4 lb

## 2023-07-27 DIAGNOSIS — I25701 Atherosclerosis of coronary artery bypass graft(s), unspecified, with angina pectoris with documented spasm: Secondary | ICD-10-CM | POA: Diagnosis not present

## 2023-07-27 DIAGNOSIS — Z7189 Other specified counseling: Secondary | ICD-10-CM

## 2023-07-27 DIAGNOSIS — I10 Essential (primary) hypertension: Secondary | ICD-10-CM | POA: Diagnosis not present

## 2023-07-27 DIAGNOSIS — I5022 Chronic systolic (congestive) heart failure: Secondary | ICD-10-CM | POA: Diagnosis not present

## 2023-07-27 DIAGNOSIS — I2510 Atherosclerotic heart disease of native coronary artery without angina pectoris: Secondary | ICD-10-CM

## 2023-07-27 DIAGNOSIS — R0609 Other forms of dyspnea: Secondary | ICD-10-CM

## 2023-07-27 DIAGNOSIS — E782 Mixed hyperlipidemia: Secondary | ICD-10-CM

## 2023-07-27 DIAGNOSIS — I201 Angina pectoris with documented spasm: Secondary | ICD-10-CM

## 2023-07-27 NOTE — Patient Instructions (Addendum)
 Medication Instructions:  Your physician recommends that you continue on your current medications as directed. Please refer to the Current Medication list given to you today.   Follow-Up: Please follow up in 6 months with Dr. Veryl Gottron, Slater Duncan, NP or Neomi Banks, NP     Other Instructions: Let's see if your blood pressure may be part of the reason that you get tired after activity. If you can get a home blood pressure cuff, there are three readings that can help us . First is a baseline reading. This is something you can check daily (or so), ideally after morning meds but before you get going. This will give us  a good baseline. Next is checking your blood pressure when you feel the worst, right when you feel like you need to stop. It is normal for the blood pressure to be higher after activity, but knowing how high will help us . Then, check again after you have sat and rested for a while, when you feel like you are about back at your baseline.  how to check blood pressure:  -sit comfortably in a chair, feet uncrossed and flat on floor, for 5-10 minutes  -arm ideally should rest at the level of the heart. However, arm should be relaxed and not tense (for example, do not hold the arm up unsupported)  -avoid exercise, caffeine , and tobacco for at least 30 minutes prior to BP reading  -don't take BP cuff reading over clothes (always place on skin directly)  -I prefer to know how well the medication is working, so I would like you to take your readings 1-2 hours after taking your blood pressure medication if possible

## 2023-07-27 NOTE — Progress Notes (Signed)
 Cardiology Office Note:  .   Date:  07/27/2023  ID:  Judythe Nurse, DOB [birth year], MRN [id number] PCP: Allwardt, Deleta Felix, PA-C  [facility] Providers Cardiologist:  Sheryle Donning, MD {  History of Present Illness: .   Donna Bullock is a 68 y.o. female with PMH CAD, HLD, hypertension, RA, fibromyalgia, anxiety, prior tobacco abuse. She was previously followed by Dr. Ollis Bi, Dr. Abel Hoe, Isabelle Maple, Dr. Ardell Beauvais and established care with me on 02/06/23.  Pertinent CV history: CAD with PCI in RCA iin 1996;She had an anterior MI with cardiac arrest (V Fib) in 2002. Cath showed a 75% distal LAD narrowing and this was managed conservatively with relook cath two days later showing 50% distal stenosis. She then had an inferior MI in 2003 with placement of overlapping Cypher drug eluting stents in the RCA. The LAD and Circumflex had mild disease per report. Four days later, she had severe chest pain, cath showed severe spasm of the LAD and Circumflex which resolved with IC vasodilators. Cath 2007 and showed patency of stents in the RCA and no other obstructive disease in the LAD and Circumflex. Remote stress test was in November 2009 and showed normal LVEF with no evidence of ischemia. She was seen in the ED 09/02/13 with bright red blood per rectum. Her ASA and Plavix  were held. Colonoscopy 09/09/13 per Dr. Tova Fresh with polyps removed but no other bleeding source identified. Aspirin  was stopped.   She was lost to follow-up until August of 2016 where she saw Isabelle Maple. Underwent myoview  with no ischemia but EF depressed. Had repeat cath which showed Distal LAD SCAD healed, no LM disease, LAD is large without significant disease. RCA 30% proximal to mid atherosclerotic disease. Mid to distal RCA stents patent. LV gram with inferobasal akinesis. Was recommened for continued medical management.  Today: Has lost 28 lbs since February, feels much better. No carbs, low fat. Rare  palpitations, nonlimiting. Works in her yard about 6 hours/day, has to stop after about 30 minutes and rest, then gets back to activity.   We discussed checking home blood pressures, both at rest, when feeling tired, and then after resting to get a sense of whether this might be causing some of her symptoms.  ROS: Denies chest pain, shortness of breath at rest or with normal exertion. No PND, orthopnea, LE edema or unexpected weight gain. No syncope. ROS otherwise negative except as noted.   Studies Reviewed: Aaron Aas    EKG:  EKG Interpretation Date/Time:  Monday July 27 2023 10:20:22 EDT Ventricular Rate:  57 PR Interval:  170 QRS Duration:  88 QT Interval:  402 QTC Calculation: 391 R Axis:   54  Text Interpretation: Sinus bradycardia Non-specific ST-t changes similar to prior Confirmed by Sheryle Donning [phone]) on 07/27/2023 10:52:40 AM    Physical Exam:   VS:  BP (!) 140/86   Pulse (!) 57   Ht 5\' 7"  (1.702 m)   Wt 165 lb 6.4 oz (75 kg)   LMP 02/18/1995   SpO2 93%   BMI 25.91 kg/m    Wt Readings from Last 3 Encounters:  07/27/23 165 lb 6.4 oz (75 kg)  06/11/23 172 lb 6.4 oz (78.2 kg)  06/01/23 173 lb (78.5 kg)    GEN: Well nourished, well developed in no acute distress HEENT: Normal, moist mucous membranes NECK: No JVD CARDIAC: regular rhythm, normal S1 and S2, no rubs or gallops. 1-2/6 systolic murmur. VASCULAR: Radial and DP pulses 2+ bilaterally.  No carotid bruits RESPIRATORY:  Clear to auscultation without rales, wheezing or rhonchi  ABDOMEN: Soft, non-tender, non-distended MUSCULOSKELETAL:  Ambulates independently SKIN: Warm and dry, no edema NEUROLOGIC:  Alert and oriented x 3. No focal neuro deficits noted. PSYCHIATRIC:  Normal affect    ASSESSMENT AND PLAN: .    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 LDL 36, TG 94 (down from 434 TG prior) after major diet changes and weight loss  CV risk counseling and prevention -recommend heart healthy/Mediterranean diet, with whole grains, fruits, vegetable, fish, lean meats, nuts, and olive oil. Limit salt. -recommend moderate walking, 3-5 times/week for 30-50 minutes each session. Aim for at least 150 minutes.week. Goal should be pace of 3 miles/hours, or walking 1.5 miles in 30 minutes -recommend avoidance of tobacco products. Avoid excess alcohol.  Dispo: 6 months or sooner as needed  Signed, Sheryle Donning, MD   Sheryle Donning, MD, PhD, [facility]  [facility]  [place]  [facility] at [facility] at [facility] [address], [address] [place], [place] [address] [phone]

## 2023-09-02 ENCOUNTER — Other Ambulatory Visit: Payer: Self-pay | Admitting: Physician Assistant

## 2023-09-02 DIAGNOSIS — F419 Anxiety disorder, unspecified: Secondary | ICD-10-CM

## 2023-09-15 ENCOUNTER — Telehealth: Payer: Self-pay

## 2023-09-15 NOTE — Telephone Encounter (Signed)
   Name: Donna Bullock  DOB: 31-Mar-1955  MRN: [id number]   Primary Cardiologist: Shelda Bruckner, MD  Chart reviewed as part of pre-operative protocol coverage. Donna Bullock was last seen on 07/27/2023 by Dr. Bruckner.  She was doing well at that time with no complaints.  Spoke with patient via telephone today.  She reports no new cardiac complaints.  She continues to be very active in her home.  She does a lot of yard work and this morning power washed her deck and outdoor furniture.  Therefore, based on ACC/AHA guidelines, the patient would be an acceptable risk for the planned procedure without further cardiovascular testing.   Per office protocol and prior approval for past surgeries, she may hold Plavix  for 5 days prior to procedure and should resume as soon as hemodynamically stable postoperatively.   I will route this recommendation to the requesting party via Epic fax function and remove from pre-op pool. Please call with questions.  Barnie Hila, NP 09/15/2023, 12:36 PM

## 2023-09-15 NOTE — Telephone Encounter (Signed)
   Pre-operative Risk Assessment    Patient Name: Donna Bullock  DOB: 05/31/1955 MRN: [id number]   Date of last office visit: 07/27/23 SHELDA BRUCKNER, MD Date of next office visit: NONE   Request for Surgical Clearance    Procedure:  BILATERAL UPPER EYELID BLEPHAROPLASTY  Date of Surgery:  Clearance 09/21/23                                Surgeon:  DR OSBORNE PONDER Surgeon's Group or Practice Name:  [facility] Phone number:  [phone] Fax number:  [phone]   Type of Clearance Requested:   - Medical  - Pharmacy:  Hold Clopidogrel  (Plavix )     Type of Anesthesia:  Not Indicated   Additional requests/questions:    SignedLucie [name]   09/15/2023, 12:17 PM

## 2023-09-22 ENCOUNTER — Other Ambulatory Visit: Payer: Self-pay | Admitting: Physician Assistant

## 2023-10-05 ENCOUNTER — Other Ambulatory Visit: Payer: Self-pay | Admitting: Cardiology

## 2023-10-05 ENCOUNTER — Telehealth: Payer: Self-pay | Admitting: Cardiology

## 2023-10-05 MED ORDER — ROSUVASTATIN CALCIUM 10 MG PO TABS
10.0000 mg | ORAL_TABLET | Freq: Every day | ORAL | 3 refills | Status: AC
Start: 2023-10-05 — End: ?

## 2023-10-05 MED ORDER — CLOPIDOGREL BISULFATE 75 MG PO TABS: 75.0000 mg | ORAL_TABLET | Freq: Every day | ORAL | 2 refills | Status: AC

## 2023-10-05 NOTE — Telephone Encounter (Signed)
 Rx sent.

## 2023-10-05 NOTE — Telephone Encounter (Signed)
*  STAT* If patient is at the pharmacy, call can be transferred to refill team.   1. Which medications need to be refilled? (please list name of each medication and dose if known) rosuvastatin  (CRESTOR ) 10 MG tablet    2. Would you like to learn more about the convenience, safety, & potential cost savings by using the [facility]?     3. Are you open to using the [facility] (Type [facility].  ).   4. Which pharmacy/location (including street and city if local pharmacy) is medication to be sent to? [facility] - [place], [place] - [address]    5. Do they need a 30 day or 90 day supply? 90 day    Pt out the medication

## 2023-11-12 ENCOUNTER — Ambulatory Visit (INDEPENDENT_AMBULATORY_CARE_PROVIDER_SITE_OTHER): Admitting: Physician Assistant

## 2023-11-12 ENCOUNTER — Encounter: Payer: Self-pay | Admitting: Physician Assistant

## 2023-11-12 VITALS — BP 130/84 | HR 60 | Temp 97.3°F | Ht 67.0 in | Wt 153.0 lb

## 2023-11-12 DIAGNOSIS — M0579 Rheumatoid arthritis with rheumatoid factor of multiple sites without organ or systems involvement: Secondary | ICD-10-CM | POA: Diagnosis not present

## 2023-11-12 DIAGNOSIS — I10 Essential (primary) hypertension: Secondary | ICD-10-CM

## 2023-11-12 DIAGNOSIS — R7303 Prediabetes: Secondary | ICD-10-CM | POA: Diagnosis not present

## 2023-11-12 DIAGNOSIS — H04123 Dry eye syndrome of bilateral lacrimal glands: Secondary | ICD-10-CM | POA: Diagnosis not present

## 2023-11-12 DIAGNOSIS — J3089 Other allergic rhinitis: Secondary | ICD-10-CM

## 2023-11-12 DIAGNOSIS — E781 Pure hyperglyceridemia: Secondary | ICD-10-CM

## 2023-11-12 DIAGNOSIS — F419 Anxiety disorder, unspecified: Secondary | ICD-10-CM

## 2023-11-12 LAB — POCT GLYCOSYLATED HEMOGLOBIN (HGB A1C): Hemoglobin A1C: 5.7 % — AB (ref 4.0–5.6)

## 2023-11-12 NOTE — Patient Instructions (Signed)
  VISIT SUMMARY: Today we reviewed your progress with prediabetes, high triglycerides, rheumatoid arthritis, and depression. You have made significant improvements in your health through dietary changes and weight loss. We also discussed your ongoing management of dry eyes and hypertension.  YOUR PLAN:  DRY EYE SYNDROME: Chronic dry eye symptoms with significant use of artificial tears. Recent eyelid surgery may contribute to symptoms. Possible allergy or sinus-related component. -Switch to preservative-free artificial tears. -Call Dr. Royetta at [facility] in [place] for further evaluation and management. -Consider using AccuSoft lid scrub and Optase tea tree oil gel for eyelid hygiene. -Try Pataday drops for potential allergy-related symptoms.  RHEUMATOID ARTHRITIS: Chronic rheumatoid arthritis with ongoing pain, though currently under better control with current medication regimen. -Continue current RA medication regimen.  PREDIABETES: Previously diagnosed with prediabetes with an A1c of 6.3%. Current A1c is 5.7%, indicating significant improvement and no longer in the prediabetic range. -Continue current dietary regimen with mindful moderation of carbohydrates.  HYPERTRIGLYCERIDEMIA: Previously elevated triglycerides at 434 mg/dL, non-fasting. Recent recheck showed significant improvement with triglycerides dropping to 90 mg/dL. -Continue current dietary regimen.  HYPERTENSION: Hypertension managed with lisinopril , metoprolol , and diltiazem . Blood pressure may be elevated due to situational stress during the visit. -Continue current antihypertensive regimen.  CORONARY ARTERY DISEASE AND CONGESTIVE HEART FAILURE: Follow-up with cardiologist is due. Last appointment was in June, with a recommendation for a follow-up in December. -Schedule follow-up appointment with cardiologist in December.   ALLERGIC RHINITIS: Chronic sinus issues with previous allergy evaluation and treatment that  was ineffective. Symptoms may contribute to dry eye syndrome. -Consider allergy management strategies as needed.  DEPRESSION: Currently well-managed on Lexapro  with good mood and no depressive symptoms. She reports improved coping with loss of spouse and maintaining an active lifestyle. -Continue Lexapro .                      Contains text generated by [employer]bridge.                                 Contains text generated by [employer]bridge.

## 2023-11-12 NOTE — Progress Notes (Signed)
 Patient ID: Donna Bullock, female    DOB: 08-24-1955, 68 y.o.   MRN: [id number]   Assessment & Plan:  Prediabetes -     POCT glycosylated hemoglobin (Hb A1C)  Anxiety  Bilateral dry eyes  Rheumatoid arthritis involving multiple sites with positive rheumatoid factor (HCC)  High triglycerides  Primary hypertension  Non-seasonal allergic rhinitis, unspecified trigger      Assessment and Plan Assessment & Plan Rheumatoid arthritis Chronic rheumatoid arthritis with ongoing pain, though currently under better control with current medication regimen. Notable improvement in symptoms, such as reduced hand heat and no longer requiring ice packs nightly. - Continue current RA medication regimen.  Prediabetes Previously diagnosed with prediabetes with an A1c of 6.3%. Current A1c is 5.7%, indicating significant improvement and no longer in the prediabetic range. This improvement is attributed to dietary changes, including cutting out sugar, pasta, and bread, and significant weight loss. - Continue current dietary regimen with mindful moderation of carbohydrates. Lab Results  Component Value Date   HGBA1C 5.7 (A) 11/12/2023   HGBA1C 6.3 03/30/2023     Hypertriglyceridemia Previously elevated triglycerides at 434 mg/dL, non-fasting. Recent recheck showed significant improvement with triglycerides dropping to 90 mg/dL, likely due to dietary changes and weight loss. - Continue current dietary regimen.  Hypertension Hypertension managed with lisinopril , metoprolol , and diltiazem . Blood pressure may be elevated due to situational stress during the visit. - Continue current antihypertensive regimen. - Follows with cardiology  Dry eye syndrome Chronic dry eye symptoms with significant use of artificial tears. Recent eyelid surgery may contribute to symptoms. Possible allergy or sinus-related component. Current artificial tears may contain preservatives, which could exacerbate  symptoms. - Switch to preservative-free artificial tears. - Refer to dry eye specialist in [facility] for further evaluation and management. - Consider using AccuSoft lid scrub and Optase tea tree oil gel for eyelid hygiene. - Try Pataday drops for potential allergy-related symptoms.  Allergic rhinitis Chronic sinus issues with previous allergy evaluation and treatment that was ineffective. Symptoms may contribute to dry eye syndrome. - Consider allergy management strategies as needed.  Depression and Anxiety  Currently well-managed on Lexapro  with good mood and no depressive symptoms. She reports improved coping with loss of spouse and maintaining an active lifestyle. - Continue Lexapro .      Return in about 6 months (around 05/11/2024) for recheck/follow-up.    Subjective:    Chief Complaint  Patient presents with   Medical Management of Chronic Issues    Pt in office for 6 mon follow up with PCP; pt has lost weight, changed diet. Not ate pasta or bread since February. Pt admits she feels a lot better; Pt is asking about Covid RSV and flu Pneumonia; pt scheduled for Prevnar 20 and flu nurse visit next week.    HPI Discussed the use of AI scribe software for clinical note transcription with the patient, who gave verbal consent to proceed.  History of Present Illness Donna Bullock is a 68 year old female with prediabetes, high triglycerides, rheumatoid arthritis, and depression who presents for a regular six-month follow-up.  She has been managing her prediabetes by closely monitoring her diet, eliminating pasta, bread, and sugar since February, resulting in a weight loss from 172 pounds in April to 153 pounds today. She feels significantly better with these changes.  Her history of high triglycerides showed a previous non-fasting level of 434 mg/dL, which has improved to 90 mg/dL, attributed to her dietary changes. Her cholesterol levels are  reported as very good.  She is  on monthly infusions affecting her immune system, necessitating careful scheduling of vaccinations.  She has been on her current rheumatoid arthritis medication for eight years. Although she still experiences significant pain, her hands no longer get hot, and she no longer needs to use ice packs nightly, noting a significant improvement in her symptoms.  She experiences severe dry eyes, using approximately 20 individual vials of Refresh eye drops daily. After a recent eye lift, she reports persistent redness and itching of her eyelids. She suspects a fungal infection but was informed by her retinal doctor that it is not the case. She is unsure if her eye drops are preservative-free.  She is on Lexapro  and feels 'pretty undepressed', coping with the loss of her husband by engaging in various activities.     Past Medical History:  Diagnosis Date   Arthritis    rheumatoid arthritis, and osteoarthritis    Benign tumor of adrenal gland    CAD (coronary artery disease)    Inferior MI 1996 tx with bare metal stent RCA. Repeat  anterior MI 2002 with LAD vasospasm. Repeat inferior MI 2003 with occlusion RCA with overlapping Cypher DES in RCA. Repeat cath 2003 with profound vasospasm LAd and Circumflex. Repeat cath 2007  with patent RCA and no disease in LAD or Circumflex.   Chronic pain    Depression    Environmental allergies    Family history of adverse reaction to anesthesia    mother had ? problem with anesthesia - was alcoholic - mind was never right again   Family history of breast cancer    Family history of leukemia    Family history of stomach cancer    Fibromyalgia    GERD (gastroesophageal reflux disease)    Heart murmur    Heart murmur    History of kidney stones    History of stomach ulcers    due to taking aspirin  for headaches   HTN (hypertension)    Hyperlipidemia    Left wrist pain 06/20/2019   Macular degeneration disease    Myocardial infarction [facility])    times 4  very minor stents x2    Myocardial infarction (HCC)    Osteopenia    Osteoporosis    Pain of right hip joint 07/17/2021   Rheumatic arteritis    Rotator cuff tear    left   Sleep apnea    uses c pap - does not know settings    Urinary incontinence     Past Surgical History:  Procedure Laterality Date   ABDOMINAL HYSTERECTOMY     BACK SURGERY     CYSTOSCOPY WITH URETEROSCOPY AND STENT PLACEMENT Left 08/24/2019   Procedure: CYSTOSCOPY WITH DIAGNOSTIC URETEROSCOPY , REMOVAL OF STONES AND LEFT  STENT PLACEMENT;  Surgeon: Cam Morene ORN, MD;  Location: [facility];  Service: Urology;  Laterality: Left;   LAPAROSCOPIC LYSIS OF ADHESIONS     LEFT HEART CATH AND CORONARY ANGIOGRAPHY N/A 11/16/2017   Procedure: LEFT HEART CATH AND CORONARY ANGIOGRAPHY;  Surgeon: Claudene Victory ORN, MD;  Location: [facility];  Service: Cardiovascular;  Laterality: N/A;   MYOMECTOMY     age 12   PELVIC ABCESS DRAINAGE     SHOULDER OPEN ROTATOR CUFF REPAIR Left 11/16/2014   Procedure: LEFT MINI OPEN ROTATOR CUFF REPAIR SHOULDER OPEN WITH SUBACROMIAL DECOMPRESSION;  Surgeon: Reyes Billing, MD;  Location: [facility];  Service: Orthopedics;  Laterality: Left;   TOTAL HIP ARTHROPLASTY  Right 11/05/2021   Procedure: TOTAL HIP ARTHROPLASTY ANTERIOR APPROACH;  Surgeon: Ernie Cough, MD;  Location: [facility];  Service: Orthopedics;  Laterality: Right;    Family History  Problem Relation Age of Onset   Alcohol abuse Father    Lung cancer Father    Cancer - Lung Father    Alcohol abuse Mother    Breast cancer Mother 59   Breast cancer Maternal Aunt 28   Breast cancer Maternal Grandmother 34   Breast cancer Maternal Aunt 72   Breast cancer Cousin 24   Leukemia Sister 75   Stomach cancer Paternal Aunt 51   Cancer Cousin 90       type unk    Social History   Tobacco Use   Smoking status: Some Days    Types: E-cigarettes    Passive exposure: Past   Smokeless tobacco: Never   Tobacco comments:    currently  using e-cigs  Vaping Use   Vaping status: Some Days  Substance Use Topics   Alcohol use: Not Currently    Comment: wine once every 2 months   Drug use: No     Allergies  Allergen Reactions   Cat Dander    Orencia [Abatacept] Hives   Other Hives and Diarrhea    Peppers    Tomato Diarrhea   Valium  Other (See Comments)    Altered mental state   Enbrel [Etanercept] Rash    Review of Systems NEGATIVE UNLESS OTHERWISE INDICATED IN HPI      Objective:     BP (!) 142/78 (BP Location: Left Arm, Patient Position: Sitting, Cuff Size: Normal)   Pulse 60   Temp (!) 97.3 F (36.3 C) (Temporal)   Ht 5' 7 (1.702 m)   Wt 153 lb (69.4 kg)   LMP 02/18/1995   SpO2 98%   BMI 23.96 kg/m   Wt Readings from Last 3 Encounters:  11/12/23 153 lb (69.4 kg)  07/27/23 165 lb 6.4 oz (75 kg)  06/11/23 172 lb 6.4 oz (78.2 kg)    BP Readings from Last 3 Encounters:  11/12/23 (!) 142/78  07/27/23 (!) 140/86  06/11/23 136/80     Physical Exam Vitals and nursing note reviewed.  Constitutional:      Appearance: Normal appearance. She is normal weight. She is not toxic-appearing.  HENT:     Head: Normocephalic and atraumatic.     Right Ear: External ear normal.     Left Ear: External ear normal.     Mouth/Throat:     Mouth: Mucous membranes are moist.  Eyes:     Extraocular Movements: Extraocular movements intact.     Conjunctiva/sclera: Conjunctivae normal.     Pupils: Pupils are equal, round, and reactive to light.     Comments: Erythema mild irritation inner eyelids  Cardiovascular:     Rate and Rhythm: Normal rate and regular rhythm.     Pulses: Normal pulses.     Heart sounds: Normal heart sounds.  Pulmonary:     Effort: Pulmonary effort is normal.     Breath sounds: Normal breath sounds.  Musculoskeletal:        General: Swelling and tenderness present. Normal range of motion.     Cervical back: Normal range of motion and neck supple.     Comments: Bilateral hands  swelling & TTP noted around mcp joints  Skin:    General: Skin is warm and dry.     Findings: No lesion or rash.  Neurological:  General: No focal deficit present.     Mental Status: She is alert and oriented to person, place, and time.  Psychiatric:        Mood and Affect: Mood normal.        Behavior: Behavior normal.             Snyder Colavito M Mirriam Vadala, PA-C

## 2023-11-21 ENCOUNTER — Other Ambulatory Visit: Payer: Self-pay | Admitting: Cardiology

## 2023-11-24 ENCOUNTER — Ambulatory Visit

## 2023-11-24 ENCOUNTER — Other Ambulatory Visit (HOSPITAL_BASED_OUTPATIENT_CLINIC_OR_DEPARTMENT_OTHER): Payer: Self-pay

## 2023-11-24 MED ORDER — FLUZONE HIGH-DOSE 0.5 ML IM SUSY
0.5000 mL | PREFILLED_SYRINGE | Freq: Once | INTRAMUSCULAR | 0 refills | Status: AC
  Filled 2023-11-24: qty 0.5, 1d supply, fill #0

## 2023-11-24 MED ORDER — COMIRNATY 30 MCG/0.3ML IM SUSY
0.3000 mL | PREFILLED_SYRINGE | Freq: Once | INTRAMUSCULAR | 0 refills | Status: AC
  Filled 2023-11-24: qty 0.3, 1d supply, fill #0

## 2024-01-04 ENCOUNTER — Other Ambulatory Visit: Payer: Self-pay

## 2024-01-04 ENCOUNTER — Emergency Department (HOSPITAL_BASED_OUTPATIENT_CLINIC_OR_DEPARTMENT_OTHER)

## 2024-01-04 ENCOUNTER — Encounter (HOSPITAL_BASED_OUTPATIENT_CLINIC_OR_DEPARTMENT_OTHER): Payer: Self-pay | Admitting: Emergency Medicine

## 2024-01-04 ENCOUNTER — Emergency Department (HOSPITAL_BASED_OUTPATIENT_CLINIC_OR_DEPARTMENT_OTHER)
Admission: EM | Admit: 2024-01-04 | Discharge: 2024-01-04 | Disposition: A | Discharge status: Home / Self Care | Attending: Emergency Medicine | Admitting: Emergency Medicine

## 2024-01-04 DIAGNOSIS — I10 Essential (primary) hypertension: Secondary | ICD-10-CM | POA: Insufficient documentation

## 2024-01-04 DIAGNOSIS — Z7902 Long term (current) use of antithrombotics/antiplatelets: Secondary | ICD-10-CM | POA: Diagnosis not present

## 2024-01-04 DIAGNOSIS — S99911A Unspecified injury of right ankle, initial encounter: Secondary | ICD-10-CM | POA: Diagnosis present

## 2024-01-04 DIAGNOSIS — Z79899 Other long term (current) drug therapy: Secondary | ICD-10-CM | POA: Insufficient documentation

## 2024-01-04 DIAGNOSIS — S93401A Sprain of unspecified ligament of right ankle, initial encounter: Secondary | ICD-10-CM | POA: Diagnosis not present

## 2024-01-04 DIAGNOSIS — Z96641 Presence of right artificial hip joint: Secondary | ICD-10-CM | POA: Diagnosis not present

## 2024-01-04 DIAGNOSIS — X501XXA Overexertion from prolonged static or awkward postures, initial encounter: Secondary | ICD-10-CM | POA: Diagnosis not present

## 2024-01-04 DIAGNOSIS — I2510 Atherosclerotic heart disease of native coronary artery without angina pectoris: Secondary | ICD-10-CM | POA: Insufficient documentation

## 2024-01-04 NOTE — Discharge Instructions (Signed)
 You were evaluated in the emergency room for ankle pain.  Your x-ray was negative.  Exam is consistent with a ankle sprain.  You were fitted with a ankle brace.  Please wear this whenever ambulating.  If your symptoms persist please follow-up with an orthopedic doctor.  You may call the number on the sheet to schedule an appointment.

## 2024-01-04 NOTE — ED Triage Notes (Signed)
 Reports twisiting R ankle yesterday. Ambulatory.

## 2024-01-04 NOTE — ED Provider Notes (Signed)
 [place] EMERGENCY DEPARTMENT AT [facility] Provider Note   CSN: [id number] Arrival date & time: 01/04/24  1041     Patient presents with: Ankle Pain   Donna Bullock is a 68 y.o. female who presents with complaints of right ankle injury that occurred yesterday.  Patient states that she twisted her ankle and has had difficulty ambulating since.  Has been using ice and Tylenol .  No numbness or tingling.  No prior injuries or surgeries to the affected extremity.    Ankle Pain     Past Medical History:  Diagnosis Date   Arthritis    rheumatoid arthritis, and osteoarthritis    Benign tumor of adrenal gland    CAD (coronary artery disease)    Inferior MI 1996 tx with bare metal stent RCA. Repeat  anterior MI 2002 with LAD vasospasm. Repeat inferior MI 2003 with occlusion RCA with overlapping Cypher DES in RCA. Repeat cath 2003 with profound vasospasm LAd and Circumflex. Repeat cath 2007  with patent RCA and no disease in LAD or Circumflex.   Chronic pain    Depression    Environmental allergies    Family history of adverse reaction to anesthesia    mother had ? problem with anesthesia - was alcoholic - mind was never right again   Family history of breast cancer    Family history of leukemia    Family history of stomach cancer    Fibromyalgia    GERD (gastroesophageal reflux disease)    Heart murmur    Heart murmur    History of kidney stones    History of stomach ulcers    due to taking aspirin  for headaches   HTN (hypertension)    Hyperlipidemia    Left wrist pain 06/20/2019   Macular degeneration disease    Myocardial infarction [facility])    times 4 very minor stents x2    Myocardial infarction (HCC)    Osteopenia    Osteoporosis    Pain of right hip joint 07/17/2021   Rheumatic arteritis    Rotator cuff tear    left   Sleep apnea    uses c pap - does not know settings    Urinary incontinence    Past Surgical History:  Procedure Laterality Date    ABDOMINAL HYSTERECTOMY     BACK SURGERY     CYSTOSCOPY WITH URETEROSCOPY AND STENT PLACEMENT Left 08/24/2019   Procedure: CYSTOSCOPY WITH DIAGNOSTIC URETEROSCOPY , REMOVAL OF STONES AND LEFT  STENT PLACEMENT;  Surgeon: Cam Morene ORN, MD;  Location: [facility] ORS;  Service: Urology;  Laterality: Left;   LAPAROSCOPIC LYSIS OF ADHESIONS     LEFT HEART CATH AND CORONARY ANGIOGRAPHY N/A 11/16/2017   Procedure: LEFT HEART CATH AND CORONARY ANGIOGRAPHY;  Surgeon: Claudene Victory ORN, MD;  Location: [facility] INVASIVE CV LAB;  Service: Cardiovascular;  Laterality: N/A;   MYOMECTOMY     age 43   PELVIC ABCESS DRAINAGE     SHOULDER OPEN ROTATOR CUFF REPAIR Left 11/16/2014   Procedure: LEFT MINI OPEN ROTATOR CUFF REPAIR SHOULDER OPEN WITH SUBACROMIAL DECOMPRESSION;  Surgeon: Reyes Billing, MD;  Location: [facility] ORS;  Service: Orthopedics;  Laterality: Left;   TOTAL HIP ARTHROPLASTY Right 11/05/2021   Procedure: TOTAL HIP ARTHROPLASTY ANTERIOR APPROACH;  Surgeon: Ernie Cough, MD;  Location: [facility] OR[facility];  Service: Orthopedics;  Laterality: Right;     Prior to Admission medications   Medication Sig Start Date End Date Taking? Authorizing Provider  Artificial Tear Solution (SOOTHE XP)  SOLN Place 1 drop into both eyes daily as needed (dry eyes).    [provider]  azelastine  (ASTELIN ) 0.1 % nasal spray Place 2 sprays into both nostrils 2 (two) times daily as needed (nasal drainage). Use in each nostril as directed 06/11/23   Jeneal Danita Macintosh, MD  azelastine  (OPTIVAR ) 0.05 % ophthalmic solution Place 1 drop into both eyes 2 (two) times daily as needed (Itchy, watery eyes). 06/11/23   Jeneal Danita Macintosh, MD  b complex vitamins tablet Take 1 tablet by mouth daily.    [provider]  bevacizumab (AVASTIN) 2.5 mg/0.1 mL SOLN intravitreal injection 0.625 mg by Intravitreal route once.    [provider]  Calcium  Citrate-Vitamin D (CITRACAL + D PO) Take 1 tablet by mouth 2 (two) times daily.     [provider]  clopidogrel  (PLAVIX ) 75 MG tablet Take 1 tablet (75 mg total) by mouth daily. 10/05/23   Lonni Slain, MD  diltiazem  (DILTIAZEM  CD) 120 MG 24 hr capsule Take 1 capsule (120 mg total) by mouth daily. 07/17/23   Lonni Slain, MD  diphenhydrAMINE  (BENADRYL ) 25 MG tablet Take 1 tablet (25 mg total) by mouth every 6 (six) hours as needed. 02/27/22   Elnor Hila P, DO  escitalopram  (LEXAPRO ) 10 MG tablet TAKE 1 TABLET BY MOUTH DAILY 09/02/23   Allwardt, Alyssa M, PA-C  isosorbide  mononitrate (IMDUR ) 30 MG 24 hr tablet TAKE ONE-HALF TABLET BY MOUTH AT BEDTIME 11/24/23   Lonni Slain, MD  levocetirizine (XYZAL ) 5 MG tablet Take 1 tablet (5 mg total) by mouth every evening. 06/11/23   Jeneal Danita Macintosh, MD  lisinopril  (ZESTRIL ) 20 MG tablet TAKE 1 TABLET BY MOUTH DAILY 02/17/23   Lonni Slain, MD  metoprolol  succinate (TOPROL -XL) 25 MG 24 hr tablet Take 1 tablet (25 mg total) by mouth daily. 04/17/23   Lonni Slain, MD  montelukast  (SINGULAIR ) 10 MG tablet Take 10 mg by mouth daily. 10/06/23   [provider]  Multiple Vitamins-Minerals (CENTRUM SILVER PO) Take 1 tablet by mouth daily.     [provider]  mupirocin  ointment (BACTROBAN ) 2 % Apply 1 Application topically 2 (two) times daily. 05/12/23   Allwardt, Alyssa M, PA-C  MYRBETRIQ  50 MG TB24 tablet Take 50 mg by mouth daily. 08/18/21   [provider]  omeprazole  (PRILOSEC) 20 MG capsule Take 1 capsule (20 mg total) by mouth daily. 07/30/16   Army Katheryn BROCKS, NP  rosuvastatin  (CRESTOR ) 10 MG tablet Take 1 tablet (10 mg total) by mouth daily. 10/05/23   Lonni Slain, MD  Secukinumab LILLIE) 125 MG/5ML SOLN as directed Intravenous 04/15/23   [provider]    Allergies: Cat dander, Orencia [abatacept], Other, Tomato, Valium , and Enbrel [etanercept]    Review of Systems  Musculoskeletal:  Positive for myalgias.    Updated Vital  Signs BP (!) 155/83 (BP Location: Right Arm)   Pulse 69   Temp 97.6 F (36.4 C) (Oral)   Resp 18   LMP 02/18/1995   SpO2 99%   Physical Exam Vitals and nursing note reviewed.  Constitutional:      General: She is not in acute distress.    Appearance: She is well-developed.  HENT:     Head: Normocephalic and atraumatic.  Eyes:     Conjunctiva/sclera: Conjunctivae normal.  Cardiovascular:     Rate and Rhythm: Normal rate and regular rhythm.     Heart sounds: No murmur heard. Pulmonary:     Effort: Pulmonary effort is  normal. No respiratory distress.     Breath sounds: Normal breath sounds.  Abdominal:     Palpations: Abdomen is soft.     Tenderness: There is no abdominal tenderness.  Musculoskeletal:        General: Swelling present.     Cervical back: Neck supple.     Comments: Mild lateral swelling, tolerates full ankle range of motion, tenderness over the lateral malleolus, DP/PT pulses are 2+, compartments are soft  Skin:    General: Skin is warm and dry.     Capillary Refill: Capillary refill takes less than 2 seconds.  Neurological:     Mental Status: She is alert.  Psychiatric:        Mood and Affect: Mood normal.     (all labs ordered are listed, but only abnormal results are displayed) Labs Reviewed - No data to display  EKG: None  Radiology: DG Ankle Complete Right Result Date: 01/04/2024 EXAM: 3 OR MORE VIEW(S) XRAY OF THE RIGHT ANKLE 01/04/2024 11:14:56 AM CLINICAL HISTORY: Pain. COMPARISON: None available. FINDINGS: BONES AND JOINTS: No acute fracture. No focal osseous lesion. No joint dislocation. SOFT TISSUES: There is lateral soft tissue swelling. IMPRESSION: 1. No acute osseous abnormality. 2. Lateral soft tissue swelling. Electronically signed by: Evalene Coho MD 01/04/2024 11:39 AM EST RP Workstation: [id number]     Procedures   Medications Ordered in the ED - No data to display  Clinical Course as of 01/04/24 1145  Mon Jan 04, 2024   1112 Patient evaluated for ankle injury that occurred yesterday.  She is hemodynamically stable.  On exam she has mild tenderness to the lateral malleolus with some associated swelling, no ecchymosis.  She is neurovascularly intact with soft compartments.  Will obtain x-ray. [JT]  1143 DG Ankle Complete Right No osseous abnormality.  Lateral soft tissue swelling. [JT]  1143 Will fit patient with ankle lace up and provide Ortho follow-up.  Patient is understanding and in agreement with plan. [JT]    Clinical Course User Index [JT] Donnajean Lynwood DEL, PA-C                                 Medical Decision Making Amount and/or Complexity of Data Reviewed Radiology: ordered.   This patient presents to the ED with chief complaint(s) of ankle pain.  The complaint involves an extensive differential diagnosis and also carries with it a high risk of complications and morbidity.   Pertinent past medical history as listed in HPI  The differential diagnosis includes  Fracture, dislocation, sprain Additional history obtained: Records reviewed Care Everywhere/External Records  Disposition:   Patient will be discharged home. The patient has been appropriately medically screened and/or stabilized in the ED. I have low suspicion for any other emergent medical condition which would require further screening, evaluation or treatment in the ED or require inpatient management. At time of discharge the patient is hemodynamically stable and in no acute distress. I have discussed work-up results and diagnosis with patient and answered all questions. Patient is agreeable with discharge plan. We discussed strict return precautions for returning to the emergency department and they verbalized understanding.     Social Determinants of Health:   none  This note was dictated with voice recognition software.  Despite best efforts at proofreading, errors may have occurred which can change the documentation meaning.        Final diagnoses:  Sprain of right ankle,  unspecified ligament, initial encounter    ED Discharge Orders     None          Donnajean Lynwood DEL, PA-C 01/04/24 1145    Ruthe Cornet, DO 01/04/24 1228

## 2024-01-16 ENCOUNTER — Other Ambulatory Visit: Payer: Self-pay | Admitting: Cardiology

## 2024-01-20 ENCOUNTER — Telehealth: Payer: Self-pay | Admitting: Cardiology

## 2024-01-20 MED ORDER — LISINOPRIL 20 MG PO TABS: 20.0000 mg | ORAL_TABLET | Freq: Every day | ORAL | 0 refills | Status: AC

## 2024-01-20 NOTE — Telephone Encounter (Signed)
 Requested Prescriptions   Signed Prescriptions Disp Refills   lisinopril  (ZESTRIL ) 20 MG tablet 90 tablet 0    Sig: Take 1 tablet (20 mg total) by mouth daily.    Authorizing Provider: LONNI SLAIN    Ordering User: Daniyah Fohl  C

## 2024-01-20 NOTE — Telephone Encounter (Signed)
*  STAT* If patient is at the pharmacy, call can be transferred to refill team.   1. Which medications need to be refilled? (please list name of each medication and dose if known)   lisinopril  (ZESTRIL ) 20 MG tablet    2. Which pharmacy/location (including street and city if local pharmacy) is medication to be sent to?  [facility] - [place], [place] - [address]      3. Do they need a 30 day or 90 day supply? 90 day

## 2024-01-26 ENCOUNTER — Ambulatory Visit (INDEPENDENT_AMBULATORY_CARE_PROVIDER_SITE_OTHER): Admitting: Family

## 2024-01-26 ENCOUNTER — Encounter (HOSPITAL_BASED_OUTPATIENT_CLINIC_OR_DEPARTMENT_OTHER): Payer: Self-pay | Admitting: Family

## 2024-01-26 VITALS — BP 128/80 | HR 74 | Ht 67.0 in | Wt 155.6 lb

## 2024-01-26 DIAGNOSIS — I428 Other cardiomyopathies: Secondary | ICD-10-CM

## 2024-01-26 DIAGNOSIS — E785 Hyperlipidemia, unspecified: Secondary | ICD-10-CM

## 2024-01-26 DIAGNOSIS — I10 Essential (primary) hypertension: Secondary | ICD-10-CM

## 2024-01-26 DIAGNOSIS — I25118 Atherosclerotic heart disease of native coronary artery with other forms of angina pectoris: Secondary | ICD-10-CM

## 2024-01-26 MED ORDER — METOPROLOL SUCCINATE ER 25 MG PO TB24: 25.0000 mg | ORAL_TABLET | Freq: Every day | ORAL | 3 refills | Status: DC

## 2024-01-26 NOTE — Progress Notes (Signed)
 Cardiology Office Note   Date:  01/26/2024  ID:  AUTIE VASUDEVAN, DOB [birth year], MRN [id number] PCP: Allwardt, Mardy HERO, PA-C  [facility] Providers Cardiologist:  Shelda Bruckner, MD     History of Present Illness Donna Bullock is a 68 y.o. female with history of CAD (1996 PCI-RCA, 2002 anterior MI with cardiac arrest (BP (managed conservatively, 2003 inferior MI overlapping Cypher drug stents in RCA.  LAD and circumflex mild disease per report repeat LHC 4 days later severe spasm of LAD and circumflex resolved with IC dilators. LVEF 40-45%. 2007 patent stents RCA, no other obstructive disease), HLD, HTN, RA, fibromyalgia, anxiety, prior tobacco use.  She was last seen 07/27/2023.  She noted some exercise intolerance which was attributed to blood pressure and home monitoring was recommended.  Presents today for follow up independently. Sprained her ankle 3 weeks ago while rakign leaves which she has recovered well from. Reports some flutters in her heart every other day lasting 5 minutes. She does stay well hydrated and limits her caffeine . Reviewed prior diagnosis of SVT. Reports her energy level is terrible, has not slept well over the last 2 weeks after losing a friend.  Had made dietary changes to follow a more heart healthy diet. Interested in joining [employer].   ROS: Please see the history of present illness.    All other systems reviewed and are negative.   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  10 months later, October 2003, SCAD in distal RCA treated with stenting.  Left main is normal  LAD is large and wraps around the apex.  LAD is normal and previous site of SCAD 15 years  ago is normal.  Circumflex coronary artery is normal.  RCA is widely patent.  Segmental 30% proximal to mid atherosclerotic disease.  Mid to distal RCA stents are widely patent.  The stents are in the distribution of previous SCAD October 2003.  Inferobasal akinesis.  EF 45 to 50%.  Normal LVEDP.  RECOMMENDATIONS:   Continue aggressive primary prevention/risk factor modification: Monitor glycemic control, blood pressure less than 130/80 mmHg, LDL less than 70, smoking cessation, and at least 150 minutes of moderate aerobic activity per week  Current symptoms not felt to represent ischemia/obstructive CAD.  Recommend Aspirin  81mg  daily for moderate CAD.  Findings Coronary Findings Diagnostic  Dominance: Right  Left Anterior Descending Mid LAD to Dist LAD lesion is 10% stenosed.  Right Coronary Artery Prox RCA lesion is 35% stenosed. Mid RCA to Dist RCA lesion is 30% stenosed. The lesion was previously treated.  Right Posterior Atrioventricular Artery Post Atrio lesion is 30% stenosed. The lesion was previously treated.  Intervention  No interventions have been documented.   STRESS TESTS  MYOCARDIAL PERFUSION IMAGING 02/05/2022  Interpretation Summary   Findings are consistent with prior myocardial infarction. The study is intermediate risk.   No ST deviation was noted.   LV perfusion is abnormal. Defect 1: There is a large defect with severe reduction in uptake present in the mid to basal inferolateral and lateral location(s) that is fixed. There is abnormal wall motion in the defect area. Consistent with infarction.   Left ventricular function is abnormal. Global function is moderately reduced. Nuclear stress EF: 41 %. The left ventricular ejection fraction is moderately decreased (30-44%). End diastolic cavity  size is moderately enlarged.   Prior study available for comparison from 10/26/2014.  Large lateral to inferior lateral infarct mid to base with no ischemia EF  estimated at 41%   ECHOCARDIOGRAM  ECHOCARDIOGRAM COMPLETE 09/18/2022  Narrative ECHOCARDIOGRAM REPORT    Patient Name:   Donna Bullock Date of Exam: 09/18/2022 Medical Rec #:  [id number]        Height:       67.0 in Accession #:    [id number]       Weight:       169.0 lb Date of Birth:  07-04-1955        BSA:          1.883 m Patient Age:    67 years         BP:           144/82 mmHg Patient Gender: F                HR:           61 bpm. Exam Location:  [address]  Procedure: 2D Echo, 3D Echo, Cardiac Doppler and Color Doppler  Indications:    I50.32 CHF  History:        Patient has prior history of Echocardiogram examinations, most recent 03/22/2021. CHF, CAD and Previous Myocardial Infarction, Arrythmias:Cardiac Arrest and Ventricular Fibrillation, Signs/Symptoms:Dyspnea and Murmur; Risk Factors:Hypertension, Dyslipidemia and Former Smoker. Palpitations.  Sonographer:    Heather Hawks RDCS Referring Phys: HEATHER E PEMBERTON  IMPRESSIONS   1. Left ventricular ejection fraction, by estimation, is 40 to 45%. The left ventricle has mildly decreased function. The left ventricle demonstrates regional wall motion abnormalities (see scoring diagram/findings for description). Left ventricular diastolic parameters are consistent with Grade I diastolic dysfunction (impaired relaxation). There is hypokinesis of the left ventricular, basal-mid inferolateral wall and inferior wall. 2. Right ventricular systolic function is normal. The right ventricular size is normal. There is normal pulmonary artery systolic pressure. The estimated right ventricular systolic pressure is 29.4 mmHg. 3. Left atrial size was mildly dilated. 4. The mitral valve is normal in structure. Mild mitral valve regurgitation. No evidence of mitral stenosis. 5. The aortic valve is normal in structure. Aortic valve regurgitation is mild. Aortic valve sclerosis/calcification is present, without any evidence of aortic  stenosis. Aortic regurgitation PHT measures 630 msec. Aortic valve area, by VTI measures 3.03 cm. Aortic valve mean gradient measures 9.0 mmHg. Aortic valve Vmax measures 2.06 m/s. 6. The inferior vena cava is normal in size with greater than 50% respiratory variability, suggesting right atrial pressure of 3 mmHg.  FINDINGS Left Ventricle: Left ventricular ejection fraction, by estimation, is 40 to 45%. The left ventricle has mildly decreased function. The left ventricle demonstrates regional wall motion abnormalities. 3D ejection fraction reviewed and evaluated as part of the interpretation. Alternate measurement of EF is felt to be most reflective of LV function. The left ventricular internal cavity size was normal in size. There is no left ventricular hypertrophy. Left ventricular diastolic parameters are consistent with Grade I diastolic dysfunction (impaired relaxation). Normal left ventricular filling pressure.  Right Ventricle: The right ventricular size is normal. No increase in right ventricular wall thickness. Right ventricular systolic function is normal. There is normal pulmonary artery systolic pressure. The tricuspid regurgitant velocity is 2.57 m/s, and with an assumed right atrial pressure of 3 mmHg, the estimated right ventricular systolic pressure is 29.4 mmHg.  Left Atrium: Left atrial size was mildly dilated.  Right Atrium: Right  atrial size was normal in size.  Pericardium: There is no evidence of pericardial effusion.  Mitral Valve: The mitral valve is normal in structure. Mild mitral valve regurgitation. No evidence of mitral valve stenosis.  Tricuspid Valve: The tricuspid valve is normal in structure. Tricuspid valve regurgitation is mild . No evidence of tricuspid stenosis.  Aortic Valve: The aortic valve is normal in structure. Aortic valve regurgitation is mild. Aortic regurgitation PHT measures 630 msec. Aortic valve sclerosis/calcification is present, without any  evidence of aortic stenosis. Aortic valve mean gradient measures 9.0 mmHg. Aortic valve peak gradient measures 17.0 mmHg. Aortic valve area, by VTI measures 3.03 cm.  Pulmonic Valve: The pulmonic valve was normal in structure. Pulmonic valve regurgitation is not visualized. No evidence of pulmonic stenosis.  Aorta: The aortic root is normal in size and structure.  Venous: The inferior vena cava is normal in size with greater than 50% respiratory variability, suggesting right atrial pressure of 3 mmHg.  IAS/Shunts: No atrial level shunt detected by color flow Doppler.   LEFT VENTRICLE PLAX 2D LVIDd:         5.60 cm   Diastology LVIDs:         4.40 cm   LV e' medial:    5.17 cm/s LV PW:         0.80 cm   LV E/e' medial:  11.2 LV IVS:        1.10 cm   LV e' lateral:   7.80 cm/s LVOT diam:     2.50 cm   LV E/e' lateral: 7.5 LV SV:         139 LV SV Index:   74 LVOT Area:     4.91 cm  3D Volume EF: 3D EF:        49 % LV EDV:       161 ml LV ESV:       82 ml LV SV:        79 ml  RIGHT VENTRICLE RV Basal diam:  3.00 cm RV S prime:     12.60 cm/s TAPSE (M-mode): 2.2 cm RVSP:           29.4 mmHg  LEFT ATRIUM             Index        RIGHT ATRIUM           Index LA diam:        4.10 cm 2.18 cm/m   RA Pressure: 3.00 mmHg LA Vol (A2C):   83.8 ml 44.51 ml/m  RA Area:     13.90 cm LA Vol (A4C):   51.2 ml 27.20 ml/m  RA Volume:   39.20 ml  20.82 ml/m LA Biplane Vol: 70.3 ml 37.34 ml/m AORTIC VALVE AV Area (Vmax):    2.91 cm AV Area (Vmean):   2.93 cm AV Area (VTI):     3.03 cm AV Vmax:           206.00 cm/s AV Vmean:          134.000 cm/s AV VTI:            0.459 m AV Peak Grad:      17.0 mmHg AV Mean Grad:      9.0 mmHg LVOT Vmax:         122.00 cm/s LVOT Vmean:        79.900 cm/s LVOT VTI:          0.283 m  LVOT/AV VTI ratio: 0.62 AI PHT:            630 msec  AORTA Ao Root diam: 3.40 cm Ao Asc diam:  3.70 cm  MITRAL VALVE               TRICUSPID VALVE MV Area  (PHT): cm         TR Peak grad:   26.4 mmHg MV Decel Time: 347 msec    TR Vmax:        257.00 cm/s MV E velocity: 58.15 cm/s  Estimated RAP:  3.00 mmHg MV A velocity: 93.05 cm/s  RVSP:           29.4 mmHg MV E/A ratio:  0.62 SHUNTS Systemic VTI:  0.28 m Systemic Diam: 2.50 cm  Wilbert Bihari MD Electronically signed by Wilbert Bihari MD Signature Date/Time: 09/18/2022/4:29:01 PM    Final    MONITORS  LONG TERM MONITOR (3-14 DAYS) 08/26/2022  Narrative   Patch wear time was 3 days   Predominant rhythm was NSR with average HR 63bpm   There was one run of NSVT with longest lasting 5 beats   12 runs of SVT with longest episode lasting 12.8s   Rare SVE (<1%), occasional VE (1.1%)   Patient triggered events correlated with NSR with PVCs   Patch Wear Time:  3 days and 0 hours (2024-06-26T10:03:41-0400 to 2024-06-29T10:09:33-0400)  Patient had a min HR of 45 bpm, max HR of 162 bpm, and avg HR of 63 bpm. Predominant underlying rhythm was Sinus Rhythm. 1 run of Ventricular Tachycardia occurred lasting 5 beats with a max rate of 114 bpm (avg 101 bpm). 12 Supraventricular Tachycardia runs occurred, the run with the fastest interval lasting 8 beats with a max rate of 162 bpm, the longest lasting 12.8 secs with an avg rate of 112 bpm. Isolated SVEs were rare (<1.0%), SVE Couplets were rare (<1.0%), and SVE Triplets were rare (<1.0%). Isolated VEs were occasional (1.1%, 2699), VE Couplets were rare (<1.0%, 266), and no VE Triplets were present.  Powell Sorrow, MD       ______________________________________________________________________________________________      Risk Assessment/Calculations           Physical Exam VS:  BP 136/80 (BP Location: Right Arm, Patient Position: Sitting, Cuff Size: Normal)   Pulse 74   Ht 5' 7 (1.702 m)   Wt 155 lb 9.6 oz (70.6 kg)   LMP 02/18/1995   SpO2 97%   BMI 24.37 kg/m        Wt Readings from Last 3 Encounters:  01/26/24 155 lb 9.6 oz  (70.6 kg)  11/12/23 153 lb (69.4 kg)  07/27/23 165 lb 6.4 oz (75 kg)    GEN: Well nourished, well developed in no acute distress NECK: No JVD; No carotid bruits CARDIAC: RRR, no murmurs, rubs, gallops RESPIRATORY:  Clear to auscultation without rales, wheezing or rhonchi  ABDOMEN: Soft, non-tender, non-distended EXTREMITIES:  No edema; No deformity   ASSESSMENT AND PLAN  CAD with PCI to distal RCA, prior LAD SCAD/coronary vasospasm - Stable with no anginal symptoms. No indication for ischemic evaluation.  GDMT Plavix  75 mg daily, diltiazem  120 mg daily, Imdur  30 mg daily, Toprol  25 mg daily, rosuvastatin  10 mg daily. Recommend aiming for 150 minutes of moderate intensity activity per week and following a heart healthy diet.   Refer to [facility]  HTN- BP well controlled on recheck. Continue current antihypertensive regimen.  Continue present regimen diltiazem  120  mg daily, Imdur  30 mg daily, lisinopril  20 mg daily.  Palpitations - infrequent and overall not bothersome. Continue Toprol  25mg  daily. May take additional 12.5mg  PRN for palpitations.   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  Declined Entresto  previously due to cost. NYHA I-II. Euvolemic and well compensated on exam.  GDMT Imdur  30mg  daily, Lisinopril  20mg  daily.  HLD, LDL goal less than 70 -04/2023 total cholesterol 102, triglycerides 94, HDL 47, LDL 36.  Continue rosuvastatin  10 mg daily.       Dispo: Follow up in 6 months  Signed, Reche GORMAN Finder, NP

## 2024-01-26 NOTE — Patient Instructions (Addendum)
 Medication Instructions:  Continue your current medications  You may take an additional half tablet of Metoprolol  as needed for palpitations.   *If you need a refill on your cardiac medications before your next appointment, please call your pharmacy*  Lab Work: Your recent cholesterol numbers looked great!  Follow-Up: At [facility], you and your health needs are our priority.  As part of our continuing mission to provide you with exceptional heart care, our providers are all part of one team.  This team includes your primary Cardiologist (physician) and Advanced Practice Providers or APPs (Physician Assistants and Nurse Practitioners) who all work together to provide you with the care you need, when you need it.  Your next appointment:   6 month(s)  Provider:   Shelda Bruckner, MD, Rosaline Bane, NP, or Reche Finder, NP    We recommend signing up for the patient portal called MyChart.  Sign up information is provided on this After Visit Summary.  MyChart is used to connect with patients for Virtual Visits (Telemedicine).  Patients are able to view lab/test results, encounter notes, upcoming appointments, etc.  Non-urgent messages can be sent to your provider as well.   To learn more about what you can do with MyChart, go to [email or web address].   Other Instructions   To prevent palpitations: Make sure you are adequately hydrated.  Avoid and/or limit caffeine  containing beverages like soda or tea. Exercise regularly.  Manage stress well. Some over the counter medications can cause palpitations such as Benadryl , AdvilPM, TylenolPM. Regular Advil or Tylenol  do not cause palpitations.

## 2024-02-14 ENCOUNTER — Other Ambulatory Visit: Payer: Self-pay | Admitting: Cardiology

## 2024-02-14 DIAGNOSIS — I25118 Atherosclerotic heart disease of native coronary artery with other forms of angina pectoris: Secondary | ICD-10-CM

## 2024-02-14 DIAGNOSIS — I10 Essential (primary) hypertension: Secondary | ICD-10-CM

## 2024-05-12 ENCOUNTER — Ambulatory Visit: Admitting: Physician Assistant

## 2024-06-06 ENCOUNTER — Ambulatory Visit
